# Patient Record
Sex: Female | Born: 1950
Health system: Southern US, Community
[De-identification: ages and names within clinical notes are randomized; demographics above are authoritative.]

## PROBLEM LIST (undated history)

## (undated) DIAGNOSIS — M8580 Other specified disorders of bone density and structure, unspecified site: Secondary | ICD-10-CM

## (undated) DIAGNOSIS — E785 Hyperlipidemia, unspecified: Secondary | ICD-10-CM

## (undated) HISTORY — PX: DENTAL SURGERY: SHX609

## (undated) HISTORY — DX: Other specified disorders of bone density and structure, unspecified site: M85.80

## (undated) HISTORY — DX: Hyperlipidemia, unspecified: E78.5

---

## 1999-08-03 ENCOUNTER — Encounter: Payer: Self-pay | Admitting: Obstetrics and Gynecology

## 1999-08-03 ENCOUNTER — Encounter: Admission: RE | Admit: 1999-08-03 | Discharge: 1999-08-03 | Payer: Self-pay | Admitting: Obstetrics and Gynecology

## 2001-02-27 ENCOUNTER — Encounter: Payer: Self-pay | Admitting: Obstetrics and Gynecology

## 2001-02-27 ENCOUNTER — Encounter: Admission: RE | Admit: 2001-02-27 | Discharge: 2001-02-27 | Payer: Self-pay | Admitting: Obstetrics and Gynecology

## 2002-05-29 ENCOUNTER — Encounter: Payer: Self-pay | Admitting: Obstetrics and Gynecology

## 2002-05-29 ENCOUNTER — Encounter: Admission: RE | Admit: 2002-05-29 | Discharge: 2002-05-29 | Payer: Self-pay | Admitting: Obstetrics and Gynecology

## 2005-01-14 ENCOUNTER — Encounter: Admission: RE | Admit: 2005-01-14 | Discharge: 2005-01-14 | Payer: Self-pay | Admitting: Family Medicine

## 2005-01-14 ENCOUNTER — Encounter: Admission: RE | Admit: 2005-01-14 | Discharge: 2005-01-14 | Payer: Self-pay | Admitting: Obstetrics and Gynecology

## 2006-01-20 ENCOUNTER — Encounter: Admission: RE | Admit: 2006-01-20 | Discharge: 2006-01-20 | Payer: Self-pay | Admitting: Obstetrics and Gynecology

## 2007-05-01 ENCOUNTER — Encounter: Admission: RE | Admit: 2007-05-01 | Discharge: 2007-05-01 | Payer: Self-pay | Admitting: Obstetrics and Gynecology

## 2008-05-05 ENCOUNTER — Encounter: Admission: RE | Admit: 2008-05-05 | Discharge: 2008-05-05 | Payer: Self-pay | Admitting: Obstetrics and Gynecology

## 2009-07-04 HISTORY — PX: COMBINED HYSTEROSCOPY DIAGNOSTIC / D&C: SUR297

## 2009-07-16 ENCOUNTER — Ambulatory Visit: Payer: Self-pay | Admitting: Gynecology

## 2009-07-20 ENCOUNTER — Ambulatory Visit: Payer: Self-pay | Admitting: Gynecology

## 2009-07-29 ENCOUNTER — Ambulatory Visit: Payer: Self-pay | Admitting: Gynecology

## 2009-08-05 ENCOUNTER — Ambulatory Visit: Payer: Self-pay | Admitting: Gynecology

## 2009-08-07 ENCOUNTER — Ambulatory Visit: Payer: Self-pay | Admitting: Gynecology

## 2009-08-07 ENCOUNTER — Ambulatory Visit (HOSPITAL_BASED_OUTPATIENT_CLINIC_OR_DEPARTMENT_OTHER): Admission: RE | Admit: 2009-08-07 | Discharge: 2009-08-07 | Payer: Self-pay | Admitting: Gynecology

## 2009-08-21 ENCOUNTER — Ambulatory Visit: Payer: Self-pay | Admitting: Gynecology

## 2009-11-23 ENCOUNTER — Encounter: Admission: RE | Admit: 2009-11-23 | Discharge: 2009-11-23 | Payer: Self-pay | Admitting: Gynecology

## 2010-12-15 ENCOUNTER — Other Ambulatory Visit: Payer: Self-pay | Admitting: Gynecology

## 2010-12-15 DIAGNOSIS — Z1231 Encounter for screening mammogram for malignant neoplasm of breast: Secondary | ICD-10-CM

## 2011-01-04 ENCOUNTER — Ambulatory Visit: Payer: Self-pay

## 2011-01-06 ENCOUNTER — Other Ambulatory Visit (HOSPITAL_COMMUNITY)
Admission: RE | Admit: 2011-01-06 | Discharge: 2011-01-06 | Disposition: A | Discharge status: Home / Self Care | Payer: BC Managed Care – PPO | Source: Ambulatory Visit | Attending: Gynecology | Admitting: Gynecology

## 2011-01-06 ENCOUNTER — Other Ambulatory Visit: Payer: Self-pay | Admitting: Gynecology

## 2011-01-06 ENCOUNTER — Encounter (INDEPENDENT_AMBULATORY_CARE_PROVIDER_SITE_OTHER): Payer: BC Managed Care – PPO | Admitting: Gynecology

## 2011-01-06 DIAGNOSIS — Z01419 Encounter for gynecological examination (general) (routine) without abnormal findings: Secondary | ICD-10-CM

## 2011-01-06 DIAGNOSIS — Z124 Encounter for screening for malignant neoplasm of cervix: Secondary | ICD-10-CM | POA: Insufficient documentation

## 2011-01-07 ENCOUNTER — Encounter: Payer: Self-pay | Admitting: *Deleted

## 2011-01-10 ENCOUNTER — Ambulatory Visit
Admission: RE | Admit: 2011-01-10 | Discharge: 2011-01-10 | Disposition: A | Discharge status: Home / Self Care | Payer: BC Managed Care – PPO | Source: Ambulatory Visit | Attending: Gynecology | Admitting: Gynecology

## 2011-01-10 DIAGNOSIS — Z1231 Encounter for screening mammogram for malignant neoplasm of breast: Secondary | ICD-10-CM

## 2011-02-02 ENCOUNTER — Encounter: Payer: Self-pay | Admitting: Gynecology

## 2012-01-09 ENCOUNTER — Other Ambulatory Visit: Payer: Self-pay | Admitting: Gynecology

## 2012-01-09 DIAGNOSIS — Z1231 Encounter for screening mammogram for malignant neoplasm of breast: Secondary | ICD-10-CM

## 2012-01-23 ENCOUNTER — Ambulatory Visit
Admission: RE | Admit: 2012-01-23 | Discharge: 2012-01-23 | Disposition: A | Discharge status: Home / Self Care | Payer: BC Managed Care – PPO | Source: Ambulatory Visit | Attending: Gynecology | Admitting: Gynecology

## 2012-01-23 DIAGNOSIS — Z1231 Encounter for screening mammogram for malignant neoplasm of breast: Secondary | ICD-10-CM

## 2012-10-11 ENCOUNTER — Ambulatory Visit (INDEPENDENT_AMBULATORY_CARE_PROVIDER_SITE_OTHER): Payer: BC Managed Care – PPO | Admitting: Family Medicine

## 2012-10-11 VITALS — BP 147/78 | HR 75 | Temp 97.9°F | Ht 64.0 in | Wt 206.8 lb

## 2012-10-11 DIAGNOSIS — J069 Acute upper respiratory infection, unspecified: Secondary | ICD-10-CM

## 2012-10-11 MED ORDER — AMOXICILLIN 500 MG PO CAPS: 500.0000 mg | ORAL_CAPSULE | Freq: Three times a day (TID) | ORAL | Status: DC

## 2012-10-11 NOTE — Progress Notes (Addendum)
  Subjective:    Patient ID: Rachael Nguyen, female    DOB: 12-16-1950, 62 y.o.   MRN: [id number]  HPI [name] presents today with cough chest congestion and sore throat   Review of Systems  Constitutional: Negative for fever and chills.  HENT: Positive for congestion, sore throat (scratchy) and sneezing. Negative for ear pain, rhinorrhea, trouble swallowing, neck pain, voice change, postnasal drip and sinus pressure.   Respiratory: Positive for cough (painful, slight phlegm).   Cardiovascular: Negative for chest pain.  Gastrointestinal: Negative.        Objective:   Physical Exam  Constitutional: She appears well-developed and well-nourished. No distress.  HENT:  Head: Normocephalic and atraumatic.  Right Ear: External ear normal.  Left Ear: External ear normal.  Eyes: Conjunctivae are normal. Right eye exhibits no discharge. Left eye exhibits no discharge.  Neck: Normal range of motion. Neck supple. No thyromegaly present.  Cardiovascular: Normal rate, regular rhythm, normal heart sounds and intact distal pulses.  Exam reveals no gallop.   No murmur heard. Pulmonary/Chest: Effort normal and breath sounds normal. No respiratory distress. She has no wheezes. She has no rales.  Dry cough  Lymphadenopathy:    She has cervical adenopathy (left).          Assessment & Plan:  1. URI (upper respiratory infection) - amoxicillin (AMOXIL) 500 MG capsule; Take 1 capsule (500 mg total) by mouth 3 (three) times daily.  Dispense: 30 capsule; Refill: 0  2. Use Mucinex one twice daily     Use Nasacort AQ one spray each nostril once daily  3. See. patient instruction note

## 2012-10-11 NOTE — Patient Instructions (Addendum)
Nasacort AQ over-the-counter 1-2 sprays each nostril once daily for allergic rhinitis Mucinex maximum strength one twice daily with a large glass of water Drink plenty of fluids Tylenol or ibuprofen as needed for aches and pains

## 2012-10-15 ENCOUNTER — Encounter: Payer: Self-pay | Admitting: Family Medicine

## 2012-10-17 ENCOUNTER — Telehealth: Payer: Self-pay | Admitting: Family Medicine

## 2012-10-18 ENCOUNTER — Ambulatory Visit (INDEPENDENT_AMBULATORY_CARE_PROVIDER_SITE_OTHER): Payer: BC Managed Care – PPO | Admitting: Nurse Practitioner

## 2012-10-18 VITALS — BP 170/79 | HR 72 | Temp 98.6°F | Ht 64.0 in | Wt 207.0 lb

## 2012-10-18 DIAGNOSIS — J209 Acute bronchitis, unspecified: Secondary | ICD-10-CM

## 2012-10-18 MED ORDER — METHYLPREDNISOLONE ACETATE 80 MG/ML IJ SUSP
80.0000 mg | Freq: Once | INTRAMUSCULAR | Status: AC
  Administered 2012-10-18: 80 mg via INTRAMUSCULAR

## 2012-10-18 MED ORDER — HYDROCODONE-HOMATROPINE 5-1.5 MG/5ML PO SYRP: 5.0000 mL | ORAL_SOLUTION | Freq: Three times a day (TID) | ORAL | Status: DC | PRN

## 2012-10-18 NOTE — Telephone Encounter (Signed)
Patient is coming in today for possible cortisone shot as she is not improve with antibiotics

## 2012-10-18 NOTE — Progress Notes (Signed)
  Subjective:    Patient ID: Rachael Nguyen, female    DOB: 1951/03/12, 62 y.o.   MRN: [id number]  HPIPatient in still C/O cough. Was seen by Dr. Christell Constant 1 week ago. Was given amoxicillin and told to do mucinex OTC. Cough no better. Getting hoarse.    Review of Systems  Constitutional: Negative for fever and chills.  HENT: Negative for ear pain, congestion, rhinorrhea, sneezing and sinus pressure.   Respiratory: Positive for cough.   Cardiovascular: Negative.   Gastrointestinal: Negative.   Psychiatric/Behavioral: Negative.        Objective:   Physical Exam  Constitutional: She appears well-developed and well-nourished.  HENT:  Head: Normocephalic.  Right Ear: External ear normal.  Left Ear: External ear normal.  Nose: Nose normal.  Mouth/Throat: Oropharynx is clear and moist.  Eyes: Pupils are equal, round, and reactive to light.  Neck: Normal range of motion. Neck supple.  Cardiovascular: Normal rate, regular rhythm and normal heart sounds.   Pulmonary/Chest: Effort normal. She has no wheezes. She has no rales.  Dry cough  Lymphadenopathy:    She has no cervical adenopathy.  Skin: Skin is warm and dry.  Psychiatric: She has a normal mood and affect. Her behavior is normal. Judgment and thought content normal.   BP 170/79  Pulse 72  Temp(Src) 98.6 F (37 C) (Oral)  Ht 5\' 4"  (1.626 m)  Wt 207 lb (93.895 kg)  BMI 35.51 kg/m2        Assessment & Plan:  1. Acute bronchitis 1. Take meds as prescribed 2. Use a cool mist humidifier especially during the winter months and when heat has  been humid. 3. Use saline nose sprays frequently 4. Saline irrigations of the nose can be very helpful if done frequently.  * 4X daily for 1 week*  * Use of a nettie pot can be helpful with this. Follow directions with this* 5. Drink plenty of fluids 6. Keep thermostat turn down low 7.For any cough or congestion  Use plain Mucinex- regular strength or max strength is fine   *  Children- consult with Pharmacist for dosing 8. For fever or aces or pains- take tylenol or ibuprofen appropriate for age and weight.  * for fevers greater than 101 orally you may alternate ibuprofen and tylenol every  3 hours.    - methylPREDNISolone acetate (DEPO-MEDROL) injection 80 mg; Inject 1 mL (80 mg total) into the muscle once. - HYDROcodone-homatropine (HYCODAN) 5-1.5 MG/5ML syrup; Take 5 mLs by mouth every 8 (eight) hours as needed for cough.  Dispense: 120 mL; Refill: 0  Mary-Margaret Daphine Deutscher, FNP

## 2012-10-18 NOTE — Telephone Encounter (Signed)
Patient seen in office

## 2012-10-18 NOTE — Patient Instructions (Signed)

## 2012-10-18 NOTE — Telephone Encounter (Signed)
Patient feels better over all but continues to have a nonproductive cough.  Has one dose of Amoxicillin left today and is taking Mucinex.  She thinks a steroid injection would help her symptoms.

## 2012-10-19 ENCOUNTER — Encounter: Payer: Self-pay | Admitting: Nurse Practitioner

## 2013-02-12 ENCOUNTER — Ambulatory Visit (INDEPENDENT_AMBULATORY_CARE_PROVIDER_SITE_OTHER): Payer: BC Managed Care – PPO | Admitting: Family Medicine

## 2013-02-12 ENCOUNTER — Encounter: Payer: Self-pay | Admitting: Family Medicine

## 2013-02-12 ENCOUNTER — Other Ambulatory Visit: Payer: Self-pay

## 2013-02-12 VITALS — BP 178/82 | HR 63 | Temp 97.4°F | Wt 211.2 lb

## 2013-02-12 DIAGNOSIS — Z0000 Encounter for general adult medical examination without abnormal findings: Secondary | ICD-10-CM

## 2013-02-12 DIAGNOSIS — Z1231 Encounter for screening mammogram for malignant neoplasm of breast: Secondary | ICD-10-CM

## 2013-02-12 DIAGNOSIS — E785 Hyperlipidemia, unspecified: Secondary | ICD-10-CM

## 2013-02-12 MED ORDER — PRAVASTATIN SODIUM 40 MG PO TABS: 40.0000 mg | ORAL_TABLET | Freq: Every day | ORAL | Status: DC

## 2013-02-12 NOTE — Patient Instructions (Signed)

## 2013-02-12 NOTE — Progress Notes (Signed)
  Subjective:    Patient ID: Rachael Nguyen, female    DOB: 1951/05/31, 62 y.o.   MRN: [id number]  HPI This 62 y.o. female presents for evaluation of follow up appointment.  She has hx of hyperlipidemia And she needs refills on her pravastatin.  She has been having some weight problems and she  Has been under a lot of stress.  She states she would like some help with losing weight.   Review of Systems No chest pain, SOB, HA, dizziness, vision change, N/V, diarrhea, constipation, dysuria, urinary urgency or frequency, myalgias, arthralgias or rash.     Objective:   Physical Exam Vital signs noted  Well developed well nourished female.  HEENT - Head atraumatic Normocephalic                Eyes - PERRLA, Conjuctiva - clear Sclera- Clear EOMI                Ears - EAC's Wnl TM's Wnl Gross Hearing WNL                Nose - Nares patent                 Throat - oropharanx wnl Respiratory - Lungs CTA bilateral Cardiac - RRR S1 and S2 without murmur GI - Abdomen soft Nontender and bowel sounds active x 4 Extremities - No edema. Neuro - Grossly intact.       Assessment & Plan:  Other and unspecified hyperlipidemia - Plan: pravastatin (PRAVACHOL) 40 MG tablet  Routine general medical examination at a health care facility - Plan: POCT CBC, Lipid panel, CMP14+EGFR, TSH  Obesity - Discussed with patient she could follow up with Tammy the clinical pharmacist for weight loss counseling. Discussed with patient she can go to weight loss center and she declines stating she does not have time.

## 2013-02-13 ENCOUNTER — Other Ambulatory Visit (INDEPENDENT_AMBULATORY_CARE_PROVIDER_SITE_OTHER): Payer: BC Managed Care – PPO

## 2013-02-13 DIAGNOSIS — Z0000 Encounter for general adult medical examination without abnormal findings: Secondary | ICD-10-CM

## 2013-02-13 LAB — POCT CBC
Granulocyte percent: 56.9 %G (ref 37–80)
HCT, POC: 40 % (ref 37.7–47.9)
Hemoglobin: 13.3 g/dL (ref 12.2–16.2)
Lymph, poc: 1.9 (ref 0.6–3.4)
MCH, POC: 28.8 pg (ref 27–31.2)
MCHC: 33.3 g/dL (ref 31.8–35.4)
MCV: 86.5 fL (ref 80–97)
MPV: 7.5 fL (ref 0–99.8)
POC Granulocyte: 3 (ref 2–6.9)
POC LYMPH PERCENT: 37.3 %L (ref 10–50)
Platelet Count, POC: 267 10*3/uL (ref 142–424)
RBC: 4.6 M/uL (ref 4.04–5.48)
RDW, POC: 14 %
WBC: 5.2 10*3/uL (ref 4.6–10.2)

## 2013-02-13 NOTE — Progress Notes (Signed)
Pt came in for labs only 

## 2013-02-14 LAB — CMP14+EGFR
ALT: 14 IU/L (ref 0–32)
AST: 18 IU/L (ref 0–40)
Albumin/Globulin Ratio: 1.5 (ref 1.1–2.5)
Albumin: 4 g/dL (ref 3.5–5.5)
Alkaline Phosphatase: 66 IU/L (ref 39–117)
BUN/Creatinine Ratio: 21 (ref 9–23)
BUN: 15 mg/dL (ref 6–24)
CO2: 25 mmol/L (ref 18–29)
Calcium: 9.7 mg/dL (ref 8.7–10.2)
Chloride: 103 mmol/L (ref 97–108)
Creatinine, Ser: 0.72 mg/dL (ref 0.57–1.00)
GFR calc Af Amer: 106 mL/min/{1.73_m2} (ref >59)
GFR calc non Af Amer: 92 mL/min/{1.73_m2} (ref >59)
Globulin, Total: 2.6 g/dL (ref 1.5–4.5)
Glucose: 87 mg/dL (ref 65–99)
Potassium: 4.1 mmol/L (ref 3.5–5.2)
Sodium: 142 mmol/L (ref 134–144)
Total Bilirubin: 0.7 mg/dL (ref 0.0–1.2)
Total Protein: 6.6 g/dL (ref 6.0–8.5)

## 2013-02-14 LAB — LIPID PANEL
Chol/HDL Ratio: 2.3 ratio units (ref 0.0–4.4)
Cholesterol, Total: 164 mg/dL (ref 100–199)
HDL: 70 mg/dL (ref >39)
LDL Calculated: 75 mg/dL (ref 0–99)
Triglycerides: 95 mg/dL (ref 0–149)
VLDL Cholesterol Cal: 19 mg/dL (ref 5–40)

## 2013-02-14 LAB — TSH: TSH: 2.82 u[IU]/mL (ref 0.450–4.500)

## 2013-02-22 ENCOUNTER — Encounter: Payer: Self-pay | Admitting: *Deleted

## 2013-03-11 ENCOUNTER — Ambulatory Visit: Payer: BC Managed Care – PPO

## 2013-03-28 ENCOUNTER — Ambulatory Visit (INDEPENDENT_AMBULATORY_CARE_PROVIDER_SITE_OTHER): Payer: BC Managed Care – PPO | Admitting: Nurse Practitioner

## 2013-03-28 VITALS — BP 132/88 | HR 63 | Temp 97.5°F | Ht 64.0 in | Wt 202.0 lb

## 2013-03-28 DIAGNOSIS — J209 Acute bronchitis, unspecified: Secondary | ICD-10-CM

## 2013-03-28 DIAGNOSIS — E785 Hyperlipidemia, unspecified: Secondary | ICD-10-CM | POA: Insufficient documentation

## 2013-03-28 MED ORDER — METHYLPREDNISOLONE ACETATE 80 MG/ML IJ SUSP
80.0000 mg | Freq: Once | INTRAMUSCULAR | Status: AC
  Administered 2013-03-28: 80 mg via INTRAMUSCULAR

## 2013-03-28 MED ORDER — AZITHROMYCIN 250 MG PO TABS: ORAL_TABLET | ORAL | Status: DC

## 2013-03-28 NOTE — Progress Notes (Signed)
  Subjective:    Patient ID: Rachael Nguyen, female    DOB: 1950-07-08, 62 y.o.   MRN: [id number]  HPI Patient in c/o of congestion that started over a week ago and has now gotten into her chest- She is coughing a lot an dis having trouble sleeping.    Review of Systems  Constitutional: Negative for fever, chills and appetite change.  HENT: Positive for congestion and rhinorrhea. Negative for ear pain.   Respiratory: Positive for cough (productive).   Cardiovascular: Negative.   Gastrointestinal: Negative.   Genitourinary: Negative.   Musculoskeletal: Negative.        Objective:   Physical Exam  Constitutional: She is oriented to person, place, and time. She appears well-developed and well-nourished.  HENT:  Right Ear: Hearing, tympanic membrane, external ear and ear canal normal.  Left Ear: Hearing, tympanic membrane, external ear and ear canal normal.  Nose: Mucosal edema and rhinorrhea present. Right sinus exhibits no maxillary sinus tenderness and no frontal sinus tenderness. Left sinus exhibits no maxillary sinus tenderness and no frontal sinus tenderness.  Mouth/Throat: Uvula is midline, oropharynx is clear and moist and mucous membranes are normal.  Cardiovascular: Normal rate and normal heart sounds.   Pulmonary/Chest: Effort normal and breath sounds normal. No respiratory distress. She has no wheezes. She has no rales.  Deep dry cough  Neurological: She is alert and oriented to person, place, and time.  Skin: Skin is warm.  Psychiatric: She has a normal mood and affect. Her behavior is normal. Judgment and thought content normal.    BP 155/83  Pulse 63  Temp(Src) 97.5 F (36.4 C) (Oral)  Ht 5\' 4"  (1.626 m)  Wt 202 lb (91.627 kg)  BMI 34.66 kg/m2       Assessment & Plan:   1. Acute bronchitis    1. Take meds as prescribed 2. Use a cool mist humidifier especially during the winter months and when heat has  been humid. 3. Use saline nose sprays frequently 4.  Saline irrigations of the nose can be very helpful if done frequently.  * 4X daily for 1 week*  * Use of a nettie pot can be helpful with this. Follow directions with this* 5. Drink plenty of fluids 6. Keep thermostat turn down low 7.For any cough or congestion  Use plain Mucinex- regular strength or max strength is fine   * Children- consult with Pharmacist for dosing 8. For fever or aces or pains- take tylenol or ibuprofen appropriate for age and weight.  * for fevers greater than 101 orally you may alternate ibuprofen and tylenol every  3 hours.   Meds ordered this encounter  Medications  . azithromycin (ZITHROMAX) 250 MG tablet    Sig: As directed    Dispense:  6 each    Refill:  0    Order Specific Question:  Supervising Provider    Answer:  Ernestina Penna [[id number]]  . methylPREDNISolone acetate (DEPO-MEDROL) injection 80 mg    Sig:    Mary-Margaret Daphine Deutscher, FNP

## 2013-03-28 NOTE — Patient Instructions (Signed)

## 2013-04-01 ENCOUNTER — Ambulatory Visit
Admission: RE | Admit: 2013-04-01 | Discharge: 2013-04-01 | Disposition: A | Discharge status: Home / Self Care | Payer: BC Managed Care – PPO | Source: Ambulatory Visit

## 2013-04-01 DIAGNOSIS — Z1231 Encounter for screening mammogram for malignant neoplasm of breast: Secondary | ICD-10-CM

## 2013-11-11 ENCOUNTER — Ambulatory Visit (INDEPENDENT_AMBULATORY_CARE_PROVIDER_SITE_OTHER): Payer: BC Managed Care – PPO | Admitting: Family Medicine

## 2013-11-11 ENCOUNTER — Encounter: Payer: Self-pay | Admitting: Family Medicine

## 2013-11-11 VITALS — BP 142/79 | HR 77 | Temp 97.1°F | Ht 64.0 in | Wt 215.6 lb

## 2013-11-11 DIAGNOSIS — J069 Acute upper respiratory infection, unspecified: Secondary | ICD-10-CM

## 2013-11-11 MED ORDER — AZITHROMYCIN 250 MG PO TABS: ORAL_TABLET | ORAL | Status: DC

## 2013-11-11 MED ORDER — METHYLPREDNISOLONE ACETATE 80 MG/ML IJ SUSP
80.0000 mg | Freq: Once | INTRAMUSCULAR | Status: AC
  Administered 2013-11-11: 80 mg via INTRAMUSCULAR

## 2013-11-11 NOTE — Progress Notes (Signed)
   Subjective:    Patient ID: Rachael Nguyen, female    DOB: [birth year], 63 y.o.   MRN: [id number]  HPI This 63 y.o. female presents for evaluation of cough and URI sx's for over a week.   Review of Systems No chest pain, SOB, HA, dizziness, vision change, N/V, diarrhea, constipation, dysuria, urinary urgency or frequency, myalgias, arthralgias or rash.     Objective:   Physical Exam  Vital signs noted  Well developed well nourished female.  HEENT - Head atraumatic Normocephalic                Eyes - PERRLA, Conjuctiva - clear Sclera- Clear EOMI                Ears - EAC's Wnl TM's Wnl Gross Hearing WNL                 Throat - oropharanx wnl Respiratory - Lungs CTA bilateral Cardiac - RRR S1 and S2 without murmur       Assessment & Plan:  URI (upper respiratory infection) - Plan: methylPREDNISolone acetate (DEPO-MEDROL) injection 80 mg, azithromycin (ZITHROMAX) 250 MG tablet  Push po fluids, rest, tylenol and motrin otc prn as directed for fever, arthralgias, and myalgias.  Follow up prn if sx's continue or persist.  Lysbeth Penner FNP

## 2014-02-18 ENCOUNTER — Telehealth: Payer: Self-pay | Admitting: Family Medicine

## 2014-02-18 ENCOUNTER — Other Ambulatory Visit: Payer: Self-pay | Admitting: Family Medicine

## 2014-02-19 ENCOUNTER — Other Ambulatory Visit: Payer: Self-pay | Admitting: Nurse Practitioner

## 2014-02-19 DIAGNOSIS — E785 Hyperlipidemia, unspecified: Secondary | ICD-10-CM

## 2014-02-19 MED ORDER — PRAVASTATIN SODIUM 40 MG PO TABS: 40.0000 mg | ORAL_TABLET | Freq: Every day | ORAL | Status: DC

## 2014-02-19 NOTE — Telephone Encounter (Signed)
Labs on your desk. 

## 2014-02-19 NOTE — Telephone Encounter (Signed)
i need results they do  Not put that in computer.

## 2014-02-19 NOTE — Telephone Encounter (Signed)
It was filled for a year!

## 2014-02-19 NOTE — Telephone Encounter (Signed)
No labs since 2014- needs to be seen for labs before can get rx on pravaststin

## 2014-02-19 NOTE — Telephone Encounter (Signed)
Patient had lab work done through [place] at the [facility]?

## 2014-02-19 NOTE — Telephone Encounter (Signed)
Patient aware already has an appointment

## 2014-02-19 NOTE — Telephone Encounter (Signed)
Will fil for one month- but has not been seen by provider since 03/2013- .Has to see provider can't just have labs done.

## 2014-03-25 ENCOUNTER — Ambulatory Visit (INDEPENDENT_AMBULATORY_CARE_PROVIDER_SITE_OTHER): Payer: BC Managed Care – PPO | Admitting: Family Medicine

## 2014-03-25 ENCOUNTER — Encounter: Payer: Self-pay | Admitting: Family Medicine

## 2014-03-25 VITALS — BP 138/81 | HR 78 | Temp 97.4°F | Ht 64.0 in | Wt 210.0 lb

## 2014-03-25 DIAGNOSIS — J069 Acute upper respiratory infection, unspecified: Secondary | ICD-10-CM

## 2014-03-25 DIAGNOSIS — E785 Hyperlipidemia, unspecified: Secondary | ICD-10-CM

## 2014-03-25 MED ORDER — METHYLPREDNISOLONE ACETATE 80 MG/ML IJ SUSP
80.0000 mg | Freq: Once | INTRAMUSCULAR | Status: AC
  Administered 2014-03-25: 80 mg via INTRAMUSCULAR

## 2014-03-25 MED ORDER — PRAVASTATIN SODIUM 40 MG PO TABS: 40.0000 mg | ORAL_TABLET | Freq: Every day | ORAL | Status: DC

## 2014-03-25 NOTE — Progress Notes (Signed)
   Subjective:    Patient ID: Rachael Nguyen, female    DOB: 03/22/1951, 63 y.o.   MRN: [id number]  HPI This 63 y.o. female presents for evaluation of uri sx's and refill on her cholesterol medicine.   Review of Systems    No chest pain, SOB, HA, dizziness, vision change, N/V, diarrhea, constipation, dysuria, urinary urgency or frequency, myalgias, arthralgias or rash.  Objective:   Physical Exam Vital signs noted  Well developed well nourished female.  HEENT - Head atraumatic Normocephalic                Eyes - PERRLA, Conjuctiva - clear Sclera- Clear EOMI                Ears - EAC's Wnl TM's Wnl Gross Hearing WNL                Nose - Nares patent                 Throat - oropharanx wnl Respiratory - Lungs CTA bilateral Cardiac - RRR S1 and S2 without murmur GI - Abdomen soft Nontender and bowel sounds active x 4 Extremities - No edema. Neuro - Grossly intact.        Assessment & Plan:  Other and unspecified hyperlipidemia - Plan: pravastatin (PRAVACHOL) 40 MG tablet  URI (upper respiratory infection) - Plan: methylPREDNISolone acetate (DEPO-MEDROL) injection 80 mg  Push po fluids, rest, tylenol and motrin otc prn as directed for fever, arthralgias, and myalgias.  Follow up prn if sx's continue or persist.  Lysbeth Penner FNP

## 2014-04-08 ENCOUNTER — Other Ambulatory Visit: Payer: Self-pay

## 2014-04-08 DIAGNOSIS — Z1239 Encounter for other screening for malignant neoplasm of breast: Secondary | ICD-10-CM

## 2014-04-21 ENCOUNTER — Other Ambulatory Visit: Payer: Self-pay

## 2014-04-21 ENCOUNTER — Ambulatory Visit
Admission: RE | Admit: 2014-04-21 | Discharge: 2014-04-21 | Disposition: A | Discharge status: Home / Self Care | Payer: BC Managed Care – PPO | Source: Ambulatory Visit

## 2014-04-21 DIAGNOSIS — Z1239 Encounter for other screening for malignant neoplasm of breast: Secondary | ICD-10-CM

## 2014-04-21 DIAGNOSIS — Z1231 Encounter for screening mammogram for malignant neoplasm of breast: Secondary | ICD-10-CM

## 2014-05-21 ENCOUNTER — Encounter: Payer: Self-pay | Admitting: Family Medicine

## 2014-05-21 ENCOUNTER — Ambulatory Visit (INDEPENDENT_AMBULATORY_CARE_PROVIDER_SITE_OTHER): Payer: BC Managed Care – PPO | Admitting: Family Medicine

## 2014-05-21 VITALS — BP 153/79 | HR 72 | Temp 97.8°F | Ht 64.0 in | Wt 214.0 lb

## 2014-05-21 DIAGNOSIS — J069 Acute upper respiratory infection, unspecified: Secondary | ICD-10-CM

## 2014-05-21 MED ORDER — AZITHROMYCIN 250 MG PO TABS: ORAL_TABLET | ORAL | Status: DC

## 2014-05-21 MED ORDER — METHYLPREDNISOLONE ACETATE 80 MG/ML IJ SUSP
80.0000 mg | Freq: Once | INTRAMUSCULAR | Status: AC
  Administered 2014-05-21: 80 mg via INTRAMUSCULAR

## 2014-05-21 NOTE — Progress Notes (Signed)
   Subjective:    Patient ID: Rachael Nguyen, female    DOB: [birth year], 63 y.o.   MRN: [id number]  HPI Patient is here with c/o uri sx's and congestion.  Review of Systems  Constitutional: Negative for fever.  HENT: Negative for ear pain.   Eyes: Negative for discharge.  Respiratory: Negative for cough.   Cardiovascular: Negative for chest pain.  Gastrointestinal: Negative for abdominal distention.  Endocrine: Negative for polyuria.  Genitourinary: Negative for difficulty urinating.  Musculoskeletal: Negative for gait problem and neck pain.  Skin: Negative for color change and rash.  Neurological: Negative for speech difficulty and headaches.  Psychiatric/Behavioral: Negative for agitation.       Objective:    BP 153/79 mmHg  Pulse 72  Temp(Src) 97.8 F (36.6 C) (Oral)  Ht 5\' 4"  (1.626 m)  Wt 214 lb (97.07 kg)  BMI 36.72 kg/m2 Physical Exam  Constitutional: She is oriented to person, place, and time. She appears well-developed and well-nourished.  HENT:  Head: Normocephalic and atraumatic.  Mouth/Throat: Oropharynx is clear and moist.  Eyes: Pupils are equal, round, and reactive to light.  Neck: Normal range of motion. Neck supple.  Cardiovascular: Normal rate and regular rhythm.   No murmur heard. Pulmonary/Chest: Effort normal and breath sounds normal.  Abdominal: Soft. Bowel sounds are normal. There is no tenderness.  Neurological: She is alert and oriented to person, place, and time.  Skin: Skin is warm and dry.  Psychiatric: She has a normal mood and affect.          Assessment & Plan:     ICD-9-CM ICD-10-CM   1. URI (upper respiratory infection) 465.9 J06.9 azithromycin (ZITHROMAX) 250 MG tablet     methylPREDNISolone acetate (DEPO-MEDROL) injection 80 mg   Push po fluids, rest, tylenol and motrin otc prn as directed for fever, arthralgias, and myalgias.  Follow up prn if sx's continue or persist.  No Follow-up on file.  Lysbeth Penner FNP

## 2014-05-22 ENCOUNTER — Telehealth: Payer: Self-pay | Admitting: Family Medicine

## 2014-05-22 NOTE — Telephone Encounter (Signed)
Patient aware and was also told if it did not improve to give [place] a call back that we had someone on call all weekend and patient verbalized understanding.

## 2014-05-22 NOTE — Telephone Encounter (Signed)
Patient states that her BP this am was 160/100. It was elevated here in the office last night. Patient states she had been taking alka selzer plus cough and cold OTC and then she also received a steroid shot last night. Could this be causing her BP to be elevated if not what do you recommend.

## 2014-05-22 NOTE — Telephone Encounter (Signed)
Avoid caffeine and decongestants rto if does not improve

## 2015-01-23 ENCOUNTER — Encounter: Payer: Self-pay | Admitting: Gastroenterology

## 2015-01-23 ENCOUNTER — Encounter: Payer: Self-pay | Admitting: Family Medicine

## 2015-01-23 ENCOUNTER — Ambulatory Visit (INDEPENDENT_AMBULATORY_CARE_PROVIDER_SITE_OTHER): Payer: BC Managed Care – PPO | Admitting: Family Medicine

## 2015-01-23 VITALS — BP 148/92 | HR 75 | Temp 97.0°F | Ht 64.0 in | Wt 193.4 lb

## 2015-01-23 DIAGNOSIS — R5383 Other fatigue: Secondary | ICD-10-CM | POA: Diagnosis not present

## 2015-01-23 DIAGNOSIS — H6983 Other specified disorders of Eustachian tube, bilateral: Secondary | ICD-10-CM

## 2015-01-23 DIAGNOSIS — Z139 Encounter for screening, unspecified: Secondary | ICD-10-CM | POA: Diagnosis not present

## 2015-01-23 DIAGNOSIS — H6993 Unspecified Eustachian tube disorder, bilateral: Secondary | ICD-10-CM

## 2015-01-23 DIAGNOSIS — E785 Hyperlipidemia, unspecified: Secondary | ICD-10-CM

## 2015-01-23 LAB — POCT CBC
GRANULOCYTE PERCENT: 62.2 % (ref 37–80)
HEMATOCRIT: 40.8 % (ref 37.7–47.9)
Hemoglobin: 12.8 g/dL (ref 12.2–16.2)
Lymph, poc: 1.8 (ref 0.6–3.4)
MCH, POC: 26.7 pg — AB (ref 27–31.2)
MCHC: 31.5 g/dL — AB (ref 31.8–35.4)
MCV: 85 fL (ref 80–97)
MPV: 7.6 fL (ref 0–99.8)
PLATELET COUNT, POC: 277 10*3/uL (ref 142–424)
POC GRANULOCYTE: 3.7 (ref 2–6.9)
POC LYMPH PERCENT: 30.8 %L (ref 10–50)
RBC: 4.8 M/uL (ref 4.04–5.48)
RDW, POC: 14.8 %
WBC: 6 10*3/uL (ref 4.6–10.2)

## 2015-01-23 NOTE — Progress Notes (Signed)
Subjective:  Patient ID: Rachael Nguyen, female    DOB: 29-Mar-1951  Age: 64 y.o. MRN: [id number]  CC: Hyperlipidemia and Dizziness   HPI [name] presents for follow-up of elevated cholesterol. Doing well without complaints on current medication. Denies side effects of statin including myalgia and arthralgia and nausea. Also in today for liver function testing. Currently no chest pain, shortness of breath or other cardiovascular related symptoms noted. She also has periodic feeling of loss of equilibrium lasting just a few moments. Random in occurrence. It happens perhaps once or twice a month. The longest duration was 10 minutes. She has not passed out she has not fallen. It resolves spontaneously without treatment. History [name] has a past medical history of Osteopenia and Hyperlipidemia.   She has past surgical history that includes Combined hysteroscopy diagnostic / D&C (2011).   Her family history is not on file.She reports that she has never smoked. She does not have any smokeless tobacco history on file. She reports that she does not drink alcohol or use illicit drugs.  Current Outpatient Prescriptions on File Prior to Visit  Medication Sig Dispense Refill  . pravastatin (PRAVACHOL) 40 MG tablet Take 1 tablet (40 mg total) by mouth daily. 30 tablet 11   No current facility-administered medications on file prior to visit.    ROS Review of Systems  Constitutional: Positive for fatigue. Negative for fever, chills, diaphoresis, appetite change and unexpected weight change.  HENT: Negative for congestion, ear pain, hearing loss, postnasal drip, rhinorrhea, sneezing, sore throat and trouble swallowing.   Eyes: Negative for pain.  Respiratory: Negative for cough, chest tightness and shortness of breath.   Cardiovascular: Negative for chest pain and palpitations.  Gastrointestinal: Negative for nausea, vomiting, abdominal pain, diarrhea and constipation.  Genitourinary: Negative  for dysuria, frequency and menstrual problem.  Musculoskeletal: Negative for joint swelling and arthralgias.  Skin: Negative for rash.  Neurological: Positive for dizziness. Negative for weakness, light-headedness, numbness and headaches.  Psychiatric/Behavioral: Negative for dysphoric mood and agitation.    Objective:  BP 148/92 mmHg  Pulse 75  Temp(Src) 97 F (36.1 C) (Oral)  Ht _0  (1.626 m)  Wt 193 lb 6.4 oz (87.726 kg)  BMI 33.18 kg/m2  BP Readings from Last 3 Encounters:  01/23/15 148/92  05/21/14 153/79  03/25/14 138/81    Wt Readings from Last 3 Encounters:  01/23/15 193 lb 6.4 oz (87.726 kg)  05/21/14 214 lb (97.07 kg)  03/25/14 210 lb (95.255 kg)     Physical Exam  Constitutional: She is oriented to person, place, and time. She appears well-developed and well-nourished. No distress.  HENT:  Head: Normocephalic and atraumatic.  Right Ear: External ear normal.  Left Ear: External ear normal.  Nose: Mucosal edema present. No nasal deformity. Right sinus exhibits no maxillary sinus tenderness and no frontal sinus tenderness. Left sinus exhibits no maxillary sinus tenderness and no frontal sinus tenderness.  Mouth/Throat: Oropharynx is clear and moist. No oropharyngeal exudate.  Eyes: Conjunctivae and EOM are normal. Pupils are equal, round, and reactive to light.  Neck: Normal range of motion. Neck supple. No thyromegaly present.  Cardiovascular: Normal rate, regular rhythm and normal heart sounds.   No murmur heard. Pulmonary/Chest: Effort normal and breath sounds normal. No respiratory distress. She has no wheezes. She has no rales.  Abdominal: Soft. Bowel sounds are normal. She exhibits no distension. There is no tenderness.  Lymphadenopathy:    She has no cervical adenopathy.  Neurological: She  is alert and oriented to person, place, and time. She has normal reflexes.  Skin: Skin is warm and dry.  Psychiatric: She has a normal mood and affect. Her behavior  is normal. Judgment and thought content normal.    No results found for: HGBA1C  Lab Results  Component Value Date   WBC 6.0 01/23/2015   HGB 12.8 01/23/2015   HCT 40.8 01/23/2015   GLUCOSE 87 02/13/2013   CHOL 164 02/13/2013   TRIG 95 02/13/2013   HDL 70 02/13/2013   LDLCALC 75 02/13/2013   ALT 14 02/13/2013   AST 18 02/13/2013   NA 142 02/13/2013   K 4.1 02/13/2013   CL 103 02/13/2013   CREATININE 0.72 02/13/2013   BUN 15 02/13/2013   CO2 25 02/13/2013   TSH 2.820 02/13/2013    Mm Digital Screening Bilateral  04/22/2014   CLINICAL DATA:  Screening.  EXAM: DIGITAL SCREENING BILATERAL MAMMOGRAM WITH CAD  COMPARISON:  Previous exam(s)  ACR Breast Density Category a: The breast tissue is almost entirely fatty.  FINDINGS: There are no findings suspicious for malignancy. Images were processed with CAD.  IMPRESSION: No mammographic evidence of malignancy. A result letter of this screening mammogram will be mailed directly to the patient.  RECOMMENDATION: Screening mammogram in one year. (Code:SM-B-01Y)  BI-RADS CATEGORY  1: Negative.   Electronically Signed   By: Shon Hale M.D.   On: 04/22/2014 16:48    Assessment & Plan:   [name] was seen today for hyperlipidemia and dizziness.  Diagnoses and all orders for this visit:  Screening Orders: -     Ambulatory referral to Gastroenterology -     Thyroid Panel With TSH -     Vit D  25 hydroxy (rtn osteoporosis monitoring)  Hyperlipemia Orders: -     CMP14+EGFR -     Lipid panel  Other fatigue Orders: -     POCT CBC -     Thyroid Panel With TSH -     Vit D  25 hydroxy (rtn osteoporosis monitoring)   I have discontinued Ms. Mccalister azithromycin. I am also having her maintain her pravastatin.  No orders of the defined types were placed in this encounter.     Follow-up: No Follow-up on file.  Claretta Fraise, M.D.

## 2015-01-24 LAB — CMP14+EGFR
ALT: 12 IU/L (ref 0–32)
AST: 14 IU/L (ref 0–40)
Albumin/Globulin Ratio: 1.7 (ref 1.1–2.5)
Albumin: 4.3 g/dL (ref 3.6–4.8)
Alkaline Phosphatase: 79 IU/L (ref 39–117)
BUN/Creatinine Ratio: 19 (ref 11–26)
BUN: 12 mg/dL (ref 8–27)
Bilirubin Total: 0.8 mg/dL (ref 0.0–1.2)
CALCIUM: 9.8 mg/dL (ref 8.7–10.3)
CO2: 24 mmol/L (ref 18–29)
CREATININE: 0.64 mg/dL (ref 0.57–1.00)
Chloride: 102 mmol/L (ref 97–108)
GFR calc Af Amer: 111 mL/min/{1.73_m2} (ref >59)
GFR, EST NON AFRICAN AMERICAN: 97 mL/min/{1.73_m2} (ref >59)
GLOBULIN, TOTAL: 2.5 g/dL (ref 1.5–4.5)
GLUCOSE: 86 mg/dL (ref 65–99)
POTASSIUM: 4.3 mmol/L (ref 3.5–5.2)
Sodium: 140 mmol/L (ref 134–144)
Total Protein: 6.8 g/dL (ref 6.0–8.5)

## 2015-01-24 LAB — VITAMIN D 25 HYDROXY (VIT D DEFICIENCY, FRACTURES): Vit D, 25-Hydroxy: 32.7 ng/mL (ref 30.0–100.0)

## 2015-01-24 LAB — THYROID PANEL WITH TSH
FREE THYROXINE INDEX: 2.6 (ref 1.2–4.9)
T3 Uptake Ratio: 26 % (ref 24–39)
T4, Total: 9.9 ug/dL (ref 4.5–12.0)
TSH: 2.19 u[IU]/mL (ref 0.450–4.500)

## 2015-01-24 LAB — LIPID PANEL
CHOL/HDL RATIO: 2.5 ratio (ref 0.0–4.4)
CHOLESTEROL TOTAL: 170 mg/dL (ref 100–199)
HDL: 69 mg/dL (ref >39)
LDL Calculated: 86 mg/dL (ref 0–99)
Triglycerides: 73 mg/dL (ref 0–149)
VLDL Cholesterol Cal: 15 mg/dL (ref 5–40)

## 2015-01-26 ENCOUNTER — Encounter: Payer: Self-pay | Admitting: *Deleted

## 2015-01-26 ENCOUNTER — Telehealth: Payer: Self-pay | Admitting: *Deleted

## 2015-01-26 NOTE — Telephone Encounter (Signed)
-----   Message from Claretta Fraise, MD sent at 01/24/2015  5:04 PM EDT ----- Rachael Nguyen,    Your lab result is normal.Some minor variations that are not significant are commonly marked abnormal, but do not represent any medical problem for you.  Best regards, Claretta Fraise, M.D.

## 2015-01-28 ENCOUNTER — Ambulatory Visit (AMBULATORY_SURGERY_CENTER): Payer: Self-pay

## 2015-01-28 VITALS — Ht 64.0 in | Wt 193.6 lb

## 2015-01-28 DIAGNOSIS — Z1211 Encounter for screening for malignant neoplasm of colon: Secondary | ICD-10-CM

## 2015-01-28 MED ORDER — MOVIPREP 100 G PO SOLR: 1.0000 | Freq: Once | ORAL | Status: DC

## 2015-01-28 NOTE — Progress Notes (Signed)
No allergies to eggs or soy No past problems with anesthesia except PONV and "takes me awhile to get over" No home oxygen No diet/weight loss meds  Refused emmi video

## 2015-02-10 ENCOUNTER — Ambulatory Visit (AMBULATORY_SURGERY_CENTER): Payer: BC Managed Care – PPO | Admitting: Gastroenterology

## 2015-02-10 ENCOUNTER — Encounter: Payer: Self-pay | Admitting: Gastroenterology

## 2015-02-10 VITALS — BP 143/74 | HR 64 | Temp 98.3°F | Resp 19 | Ht 64.0 in | Wt 193.0 lb

## 2015-02-10 DIAGNOSIS — Z1211 Encounter for screening for malignant neoplasm of colon: Secondary | ICD-10-CM

## 2015-02-10 MED ORDER — SODIUM CHLORIDE 0.9 % IV SOLN: 500.0000 mL | INTRAVENOUS | Status: DC

## 2015-02-10 NOTE — Patient Instructions (Signed)
YOU HAD AN ENDOSCOPIC PROCEDURE TODAY AT THE [facility]:   Refer to the procedure report that was given to you for any specific questions about what was found during the examination.  If the procedure report does not answer your questions, please call your gastroenterologist to clarify.  If you requested that your care partner not be given the details of your procedure findings, then the procedure report has been included in a sealed envelope for you to review at your convenience later.  YOU SHOULD EXPECT: Some feelings of bloating in the abdomen. Passage of more gas than usual.  Walking can help get rid of the air that was put into your GI tract during the procedure and reduce the bloating. If you had a lower endoscopy (such as a colonoscopy or flexible sigmoidoscopy) you may notice spotting of blood in your stool or on the toilet paper. If you underwent a bowel prep for your procedure, you may not have a normal bowel movement for a few days.  Please Note:  You might notice some irritation and congestion in your nose or some drainage.  This is from the oxygen used during your procedure.  There is no need for concern and it should clear up in a day or so.  SYMPTOMS TO REPORT IMMEDIATELY:   Following lower endoscopy (colonoscopy or flexible sigmoidoscopy):  Excessive amounts of blood in the stool  Significant tenderness or worsening of abdominal pains  Swelling of the abdomen that is new, acute  Fever of 100F or higher   For urgent or emergent issues, a gastroenterologist can be reached at any hour by calling [phone].   DIET: Your first meal following the procedure should be a small meal and then it is ok to progress to your normal diet. Heavy or fried foods are harder to digest and may make you feel nauseous or bloated.  Likewise, meals heavy in dairy and vegetables can increase bloating.  Drink plenty of fluids but you should avoid alcoholic beverages for 24  hours.  ACTIVITY:  You should plan to take it easy for the rest of today and you should NOT DRIVE or use heavy machinery until tomorrow (because of the sedation medicines used during the test).    FOLLOW UP: Our staff will call the number listed on your records the next business day following your procedure to check on you and address any questions or concerns that you may have regarding the information given to you following your procedure. If we do not reach you, we will leave a message.  However, if you are feeling well and you are not experiencing any problems, there is no need to return our call.  We will assume that you have returned to your regular daily activities without incident.  If any biopsies were taken you will be contacted by phone or by letter within the next 1-3 weeks.  Please call us at [phone] if you have not heard about the biopsies in 3 weeks.    SIGNATURES/CONFIDENTIALITY: You and/or your care partner have signed paperwork which will be entered into your electronic medical record.  These signatures attest to the fact that that the information above on your After Visit Summary has been reviewed and is understood.  Full responsibility of the confidentiality of this discharge information lies with you and/or your care-partner. 

## 2015-02-10 NOTE — Progress Notes (Signed)
Transferred to recovery room. A/O x3, pleased with MAC.  VSS.  Report to Karen, RN. 

## 2015-02-10 NOTE — Op Note (Signed)
[place]  [address] [place], [id number]   COLONOSCOPY PROCEDURE REPORT  PATIENT: Rachael, Nguyen  MR#: [id number] BIRTHDATE: Apr 06, 1951 , 63  yrs. old GENDER: female ENDOSCOPIST: Milus Banister, MD REFERRED BY: Claretta Fraise, MD PROCEDURE DATE:  02/10/2015 PROCEDURE:   Colonoscopy, screening First Screening Colonoscopy - Avg.  risk and is 50 yrs.  old or older Yes.  Prior Negative Screening - Now for repeat screening. N/A  History of Adenoma - Now for follow-up colonoscopy & has been > or = to 3 yrs.  N/A  Recommend repeat exam, <10 yrs? No ASA CLASS:   Class II INDICATIONS:Screening for colonic neoplasia and Colorectal Neoplasm Risk Assessment for this procedure is average risk. MEDICATIONS: Monitored anesthesia care and Propofol 175 mg IV  DESCRIPTION OF PROCEDURE:   After the risks benefits and alternatives of the procedure were thoroughly explained, informed consent was obtained.  The digital rectal exam revealed no abnormalities of the rectum.   The LB [id number]  endoscope was introduced through the anus and advanced to the cecum, which was identified by both the appendix and ileocecal valve. No adverse events experienced.   The quality of the prep was excellent.  The instrument was then slowly withdrawn as the colon was fully examined. Estimated blood loss is zero unless otherwise noted in this procedure report.   COLON FINDINGS: There was mild diverticulosis noted throughout the entire examined colon.   The examination was otherwise normal. Retroflexed views revealed no abnormalities. The time to cecum = 2.5 Withdrawal time = 6.0   The scope was withdrawn and the procedure completed. COMPLICATIONS: There were no immediate complications.  ENDOSCOPIC IMPRESSION: 1.   Mild diverticulosis was noted throughout the entire examined colon 2.   The examination was otherwise normal; No polyps or cancers  RECOMMENDATIONS: You should continue to  follow colorectal cancer screening guidelines for "routine risk" patients with a repeat colonoscopy in 10 years.   eSigned:  Milus Banister, MD 02/10/2015 2:31 PM

## 2015-02-11 ENCOUNTER — Telehealth: Payer: Self-pay | Admitting: *Deleted

## 2015-02-11 NOTE — Telephone Encounter (Signed)
  Follow up Call-  Call back number 02/10/2015  Post procedure Call Back phone  # [phone]  Permission to leave phone message Yes     Patient questions:  Do you have a fever, pain , or abdominal swelling? No. Pain Score  0 *  Have you tolerated food without any problems? Yes.    Have you been able to return to your normal activities? Yes.    Do you have any questions about your discharge instructions: Diet   No. Medications  No. Follow up visit  No.  Do you have questions or concerns about your Care? No.  Actions: * If pain score is 4 or above: No action needed, pain <4.

## 2015-03-03 ENCOUNTER — Telehealth: Payer: Self-pay | Admitting: Family Medicine

## 2015-03-03 DIAGNOSIS — E785 Hyperlipidemia, unspecified: Secondary | ICD-10-CM

## 2015-03-03 MED ORDER — PRAVASTATIN SODIUM 40 MG PO TABS: 40.0000 mg | ORAL_TABLET | Freq: Every day | ORAL | Status: DC

## 2015-03-03 NOTE — Telephone Encounter (Signed)
done

## 2015-03-13 ENCOUNTER — Ambulatory Visit (INDEPENDENT_AMBULATORY_CARE_PROVIDER_SITE_OTHER): Payer: BC Managed Care – PPO | Admitting: Gynecology

## 2015-03-13 ENCOUNTER — Other Ambulatory Visit (HOSPITAL_COMMUNITY)
Admission: RE | Admit: 2015-03-13 | Discharge: 2015-03-13 | Disposition: A | Discharge status: Home / Self Care | Payer: BC Managed Care – PPO | Source: Ambulatory Visit | Attending: Gynecology | Admitting: Gynecology

## 2015-03-13 ENCOUNTER — Encounter: Payer: Self-pay | Admitting: Gynecology

## 2015-03-13 VITALS — BP 160/78 | Ht 64.0 in | Wt 199.0 lb

## 2015-03-13 DIAGNOSIS — Z1151 Encounter for screening for human papillomavirus (HPV): Secondary | ICD-10-CM | POA: Insufficient documentation

## 2015-03-13 DIAGNOSIS — Z01419 Encounter for gynecological examination (general) (routine) without abnormal findings: Secondary | ICD-10-CM

## 2015-03-13 DIAGNOSIS — M8580 Other specified disorders of bone density and structure, unspecified site: Secondary | ICD-10-CM

## 2015-03-13 NOTE — Progress Notes (Signed)
Rachael Nguyen [birth year] [id number]        64 y.o.  G0P0000 for annual exam.  Has not been in the office for 4 years. Doing well without complaints.  Past medical history,surgical history, problem list, medications, allergies, family history and social history were all reviewed and documented as reviewed in the EPIC chart.  ROS:  Performed with pertinent positives and negatives included in the history, assessment and plan.   Additional significant findings :  none   Exam: Leanne Lovely Vitals:   03/13/15 1045  BP: 160/78  Height: 5\' 4"  (1.626 m)  Weight: 199 lb (90.266 kg)   General appearance:  Normal affect, orientation and appearance. Skin: Grossly normal HEENT: Without gross lesions.  No cervical or supraclavicular adenopathy. Thyroid normal.  Lungs:  Clear without wheezing, rales or rhonchi Cardiac: RR, without RMG Abdominal:  Soft, nontender, without masses, guarding, rebound, organomegaly or hernia Breasts:  Examined lying and sitting without masses, retractions, discharge or axillary adenopathy. Pelvic:  Ext/BUS/vagina with atrophic changes  Cervix with atrophic changes. Pap smear/HPV done  Uterus axial to anteverted, normal size, shape and contour, midline and mobile nontender   Adnexa  Without masses or tenderness    Anus and perineum  Normal   Rectovaginal  Normal sphincter tone without palpated masses or tenderness.    Assessment/Plan:  64 y.o. G0P0000 female for annual exam.   1. Postmenopausal/atrophic genital changes. Without significant symptoms of hot flushes, night sweats, vaginal dryness or any vaginal bleeding. Continue to monitor and report any issues or bleeding. 2. Osteopenia.  The patient reports being told that her bones were a little bit weak previously on bone density. It's been years since she has had this done. Offered to schedule DEXA now but she declined and said should rather call her physician's office in [place] where the last one  was done and have it done there and I encouraged her to do so.  Review of her blood work does show with vitamin D 32. I've encouraged her to supplement this with 1000 units daily to try to get it in the mid normal range. 3. Pap smear 2012. Pap smear/HPV today. No history of significant abnormal Pap smears previously. 4. Mammography coming due in October not reminded her to schedule this. SBE monthly reviewed. 5. Colonoscopy 02/2015. Repeat at their recommended interval. 6. Health maintenance. Blood pressure 160/78. Patient reports always elevated at doctor's offices and she follows it at home and it's normal.  No routine blood work done and she recently had this done at her primary 61 office who is following her for her hypercholesterolemia. Follow up in one year, sooner as needed.   Anastasio Auerbach MD, 11:18 AM 03/13/2015

## 2015-03-13 NOTE — Addendum Note (Signed)
Addended by: Burnett Kanaris on: 03/13/2015 11:27 AM   Modules accepted: Orders

## 2015-03-13 NOTE — Patient Instructions (Signed)

## 2015-03-14 LAB — URINALYSIS W MICROSCOPIC + REFLEX CULTURE
BILIRUBIN URINE: NEGATIVE
Bacteria, UA: NONE SEEN [HPF]
Casts: NONE SEEN [LPF]
GLUCOSE, UA: NEGATIVE
Hgb urine dipstick: NEGATIVE
Ketones, ur: NEGATIVE
LEUKOCYTES UA: NEGATIVE
NITRITE: NEGATIVE
PH: 6 (ref 5.0–8.0)
Protein, ur: NEGATIVE
RBC / HPF: NONE SEEN RBC/HPF (ref <=2)
SPECIFIC GRAVITY, URINE: 1.03 (ref 1.001–1.035)
Squamous Epithelial / LPF: NONE SEEN [HPF] (ref <=5)
WBC UA: NONE SEEN WBC/HPF (ref <=5)
Yeast: NONE SEEN [HPF]

## 2015-03-16 LAB — CYTOLOGY - PAP

## 2015-03-31 ENCOUNTER — Other Ambulatory Visit: Payer: Self-pay

## 2015-03-31 ENCOUNTER — Telehealth: Payer: Self-pay | Admitting: Family Medicine

## 2015-03-31 DIAGNOSIS — Z1231 Encounter for screening mammogram for malignant neoplasm of breast: Secondary | ICD-10-CM

## 2015-04-01 ENCOUNTER — Telehealth: Payer: Self-pay | Admitting: Family Medicine

## 2015-04-01 ENCOUNTER — Other Ambulatory Visit: Payer: Self-pay | Admitting: Family Medicine

## 2015-04-01 DIAGNOSIS — Z780 Asymptomatic menopausal state: Secondary | ICD-10-CM

## 2015-04-01 NOTE — Telephone Encounter (Signed)
Pt scheduled for 04/03/2015 at 11am

## 2015-04-03 ENCOUNTER — Other Ambulatory Visit: Payer: BC Managed Care – PPO

## 2015-04-03 ENCOUNTER — Telehealth: Payer: Self-pay | Admitting: Family Medicine

## 2015-04-07 NOTE — Telephone Encounter (Signed)
LMOM for pt to return call to reschedule her DEXA

## 2015-04-10 NOTE — Telephone Encounter (Signed)
Pt rescheduled for 04/14/2015 at 11am

## 2015-04-14 ENCOUNTER — Ambulatory Visit (INDEPENDENT_AMBULATORY_CARE_PROVIDER_SITE_OTHER): Payer: BC Managed Care – PPO

## 2015-04-14 DIAGNOSIS — Z780 Asymptomatic menopausal state: Secondary | ICD-10-CM

## 2015-04-21 ENCOUNTER — Encounter: Payer: Self-pay | Admitting: Gynecology

## 2015-04-21 ENCOUNTER — Telehealth: Payer: Self-pay | Admitting: Gynecology

## 2015-04-21 NOTE — Telephone Encounter (Signed)
Tell patient that her most recent bone density almost shows osteoporosis. It's right on the borderline. Does show some loss at the right hip.  Recommend office visit either with myself or her primary physician to discuss possible treatment options.

## 2015-04-22 NOTE — Telephone Encounter (Signed)
Pt aware with the below note, will call back to schedule.

## 2015-04-24 ENCOUNTER — Ambulatory Visit
Admission: RE | Admit: 2015-04-24 | Discharge: 2015-04-24 | Disposition: A | Discharge status: Home / Self Care | Payer: BC Managed Care – PPO | Source: Ambulatory Visit

## 2015-04-24 DIAGNOSIS — Z1231 Encounter for screening mammogram for malignant neoplasm of breast: Secondary | ICD-10-CM

## 2015-06-04 ENCOUNTER — Ambulatory Visit (INDEPENDENT_AMBULATORY_CARE_PROVIDER_SITE_OTHER): Payer: BC Managed Care – PPO | Admitting: Family Medicine

## 2015-06-04 ENCOUNTER — Ambulatory Visit: Payer: BC Managed Care – PPO | Admitting: Family Medicine

## 2015-06-04 ENCOUNTER — Ambulatory Visit: Payer: BC Managed Care – PPO

## 2015-06-04 VITALS — BP 153/85 | HR 72 | Temp 97.4°F | Ht 64.0 in | Wt 213.8 lb

## 2015-06-04 DIAGNOSIS — M8580 Other specified disorders of bone density and structure, unspecified site: Secondary | ICD-10-CM | POA: Diagnosis not present

## 2015-06-04 DIAGNOSIS — J209 Acute bronchitis, unspecified: Secondary | ICD-10-CM | POA: Diagnosis not present

## 2015-06-04 DIAGNOSIS — K219 Gastro-esophageal reflux disease without esophagitis: Secondary | ICD-10-CM | POA: Diagnosis not present

## 2015-06-04 MED ORDER — PANTOPRAZOLE SODIUM 40 MG PO TBEC: 40.0000 mg | DELAYED_RELEASE_TABLET | Freq: Every day | ORAL | Status: DC

## 2015-06-04 MED ORDER — AZITHROMYCIN 250 MG PO TABS: ORAL_TABLET | ORAL | Status: DC

## 2015-06-04 MED ORDER — BENZONATATE 100 MG PO CAPS: 100.0000 mg | ORAL_CAPSULE | Freq: Two times a day (BID) | ORAL | Status: DC | PRN

## 2015-06-04 NOTE — Patient Instructions (Signed)
Great to meet you!  Come back in 3 months to check on your cough  Start protonix for reflux and to let your larynx (voice box) heal.   I have prescribed azithromycin  Continue Vitamin D, Try citracal, Consider weight bearing exercise.   Acute Bronchitis Bronchitis is when the airways that extend from the windpipe into the lungs get red, puffy, and painful (inflamed). Bronchitis often causes thick spit (mucus) to develop. This leads to a cough. A cough is the most common symptom of bronchitis. In acute bronchitis, the condition usually begins suddenly and goes away over time (usually in 2 weeks). Smoking, allergies, and asthma can make bronchitis worse. Repeated episodes of bronchitis may cause more lung problems. HOME CARE  Rest.  Drink enough fluids to keep your pee (urine) clear or pale yellow (unless you need to limit fluids as told by your doctor).  Only take over-the-counter or prescription medicines as told by your doctor.  Avoid smoking and secondhand smoke. These can make bronchitis worse. If you are a smoker, think about using nicotine gum or skin patches. Quitting smoking will help your lungs heal faster.  Reduce the chance of getting bronchitis again by:  Washing your hands often.  Avoiding people with cold symptoms.  Trying not to touch your hands to your mouth, nose, or eyes.  Follow up with your doctor as told. GET HELP IF: Your symptoms do not improve after 1 week of treatment. Symptoms include:  Cough.  Fever.  Coughing up thick spit.  Body aches.  Chest congestion.  Chills.  Shortness of breath.  Sore throat. GET HELP RIGHT AWAY IF:   You have an increased fever.  You have chills.  You have severe shortness of breath.  You have bloody thick spit (sputum).  You throw up (vomit) often.  You lose too much body fluid (dehydration).  You have a severe headache.  You faint. MAKE SURE YOU:   Understand these instructions.  Will watch  your condition.  Will get help right away if you are not doing well or get worse.   This information is not intended to replace advice given to you by your health care provider. Make sure you discuss any questions you have with your health care provider.   Document Released: 12/07/2007 Document Revised: 02/20/2013 Document Reviewed: 12/11/2012 Elsevier Interactive Patient Education [employer].

## 2015-06-04 NOTE — Progress Notes (Signed)
   HPI  Patient presents today here with acute illness.  Patient finds that she's had nasal congestion for about 3 weeks and has now developed a cough over the last week. The cough seems to come and fits, is nonproductive, and takes her breath away. She has no dyspnea except when she's coughing. She has no fever, chills. Her appetite is normal.  She has had a cough over the last 4-5 months that she feels is related to her heartburn. She has heartburn symptoms on a daily basis and feelings of reflux. Sometimes she has pain after swallowing. She previously had resolution of symptoms after losing weight but has had symptoms return after gaining some weight back.  He is told by her GYN to look into getting treated for osteoporosis.  PMH: Smoking status noted ROS: Per HPI  Objective: BP 153/85 mmHg  Pulse 72  Temp(Src) 97.4 F (36.3 C) (Oral)  Ht 5\' 4"  (1.626 m)  Wt 213 lb 12.8 oz (96.979 kg)  BMI 36.68 kg/m2 Gen: NAD, alert, cooperative with exam HEENT: NCAT CV: RRR, good S1/S2, no murmur Resp: CTABL, no wheezes, non-labored Abd: SNTND, BS present, no guarding or organomegaly Ext: No edema, warm Neuro: Alert and oriented, No gross deficits  Assessment and plan:  # Acute bronchitis Treat with azithromycin, however believe her cough is multifactorial Tessalon Perles as well Supportive care, return to clinic if worsening or does not improve  # Cough, GERD Multifactorial cough with some chronic portion as well as acute portion Starting Protonix daily 3 months, then try to wean back to H2 blocker Follow-up 3 months unless worsening or does not improve as above  # Osteopenia Recent DEXA scan shows osteopenia very close to osteoporosis Encouraged continued by the ED, she's increased her dose to 2000 units daily Also 1200 mg of calcium, she did not tolerate calcium oxalate changed to calcium citrate   Meds ordered this encounter  Medications  . pantoprazole (PROTONIX) 40  MG tablet    Sig: Take 1 tablet (40 mg total) by mouth daily.    Dispense:  30 tablet    Refill:  3  . azithromycin (ZITHROMAX) 250 MG tablet    Sig: Take 2 tablets on day 1 and 1 tablet daily after that    Dispense:  6 tablet    Refill:  0  . benzonatate (TESSALON) 100 MG capsule    Sig: Take 1 capsule (100 mg total) by mouth 2 (two) times daily as needed for cough.    Dispense:  20 capsule    Refill:  [place], MD [facility] 06/04/2015, 6:06 PM

## 2015-07-07 ENCOUNTER — Telehealth: Payer: Self-pay | Admitting: Family Medicine

## 2015-07-07 NOTE — Telephone Encounter (Signed)
denied °

## 2015-08-25 ENCOUNTER — Other Ambulatory Visit: Payer: Self-pay | Admitting: Family Medicine

## 2015-08-26 NOTE — Telephone Encounter (Signed)
Talked to patient's husband and notified him that patient will need to be seen for further refills

## 2015-08-26 NOTE — Telephone Encounter (Signed)
Last seen 06/04/15 Dr Wendi Snipes  Dr Livia Snellen  PCP  Last lipid 01/23/15

## 2015-08-26 NOTE — Telephone Encounter (Signed)
Authorize 30 days only. Then contact the patient letting them know that they will need an appointment before any further prescriptions can be sent in. 

## 2015-09-03 ENCOUNTER — Ambulatory Visit (INDEPENDENT_AMBULATORY_CARE_PROVIDER_SITE_OTHER): Payer: BC Managed Care – PPO | Admitting: Family Medicine

## 2015-09-03 ENCOUNTER — Encounter: Payer: Self-pay | Admitting: Family Medicine

## 2015-09-03 ENCOUNTER — Encounter (INDEPENDENT_AMBULATORY_CARE_PROVIDER_SITE_OTHER): Payer: Self-pay

## 2015-09-03 VITALS — BP 132/76 | HR 76 | Temp 97.5°F | Ht 64.0 in | Wt 217.6 lb

## 2015-09-03 DIAGNOSIS — K219 Gastro-esophageal reflux disease without esophagitis: Secondary | ICD-10-CM | POA: Diagnosis not present

## 2015-09-03 DIAGNOSIS — E785 Hyperlipidemia, unspecified: Secondary | ICD-10-CM | POA: Diagnosis not present

## 2015-09-03 DIAGNOSIS — M8580 Other specified disorders of bone density and structure, unspecified site: Secondary | ICD-10-CM

## 2015-09-03 DIAGNOSIS — Z0000 Encounter for general adult medical examination without abnormal findings: Secondary | ICD-10-CM | POA: Insufficient documentation

## 2015-09-03 MED ORDER — PRAVASTATIN SODIUM 40 MG PO TABS: ORAL_TABLET | ORAL | Status: DC

## 2015-09-03 MED ORDER — PANTOPRAZOLE SODIUM 40 MG PO TBEC: 40.0000 mg | DELAYED_RELEASE_TABLET | Freq: Every day | ORAL | Status: DC

## 2015-09-03 NOTE — Patient Instructions (Signed)
Great to see you!  Lets see you back in August, Come in 1 week before that for labs  I am so glad the protonix is helping

## 2015-09-03 NOTE — Progress Notes (Signed)
HPI  Patient presents today for follow-up GERD, osteopenia, hyperlipidemia, obesity  GERD, cough Her cough and heartburn resolved quickly after starting Protonix. She is very happy with the results. She also feels like she's getting early CTD due to her topics.  Hyperlipidemia Taking pravastatin regularly They're also cutting back on fried and fatty foods and watching what 8, reduced sugar Trying to start walking again They're both, her and her husband, hoping to lose weight.   PMH: Smoking status noted ROS: Per HPI  Objective: BP 132/76 mmHg  Pulse 76  Temp(Src) 97.5 F (36.4 C) (Oral)  Ht 5' 4" (1.626 m)  Wt 217 lb 9.6 oz (98.703 kg)  BMI 37.33 kg/m2 Gen: NAD, alert, cooperative with exam HEENT: NCAT, TMs normal bilaterally, oropharynx clear CV: RRR, good S1/S2, no murmur Resp: CTABL, no wheezes, non-labored Ext: No edema, warm Neuro: Alert and oriented, No gross deficits  Assessment and plan:  # GERD, GERD related cough Doing well Protonix Try for 5 months, then try Zantac or Pepcid She understands this Follow-up 6 months   # Obesity Discussed weight loss strategies, Atkins diet, Weight Watchers Encouraged her and congratulated her for the positive changes her and her husband have already made.  # Hyperlipidemia Future fasting labs Refill pravastatin   Orders Placed This Encounter  Procedures  . Lipid panel    Standing Status: Future     Number of Occurrences:      Standing Expiration Date: 09/02/2016  . CMP14+EGFR    Standing Status: Future     Number of Occurrences:      Standing Expiration Date: 09/02/2016  . Vitamin D, 25-hydroxy    Standing Status: Future     Number of Occurrences:      Standing Expiration Date: 09/02/2016  . Hepatitis C antibody    Standing Status: Future     Number of Occurrences:      Standing Expiration Date: 09/02/2016  . TSH    Standing Status: Future     Number of Occurrences:      Standing Expiration Date: 09/02/2016      Meds ordered this encounter  Medications  . pravastatin (PRAVACHOL) 40 MG tablet    Sig: TAKE 1 TABLET (40 MG TOTAL) BY MOUTH DAILY.    Dispense:  90 tablet    Refill:  3  . pantoprazole (PROTONIX) 40 MG tablet    Sig: Take 1 tablet (40 mg total) by mouth daily.    Dispense:  90 tablet    Refill:  1    Sam Bradshaw, MD [facility] 09/03/2015, 11:26 AM      

## 2015-11-26 ENCOUNTER — Telehealth: Payer: Self-pay | Admitting: Family Medicine

## 2015-11-27 NOTE — Telephone Encounter (Signed)
Was this increased from 20 to 40mg ? Is it controlling GERD?

## 2015-11-27 NOTE — Telephone Encounter (Signed)
Patient states that she has been taking 40mg  since December and protonix has helped with GERD

## 2015-11-27 NOTE — Telephone Encounter (Signed)
Patient called stating that she has had some stomach pain and diarrhea since sunday.  Patient states she thinks that pain and diarrhea is coming from the protonix.

## 2015-11-27 NOTE — Telephone Encounter (Signed)
If pt has been taking protonix since December, abd pain and diarrhea is not related. Pt will need to be seen

## 2015-12-01 ENCOUNTER — Encounter: Payer: Self-pay | Admitting: Family Medicine

## 2015-12-01 ENCOUNTER — Ambulatory Visit (INDEPENDENT_AMBULATORY_CARE_PROVIDER_SITE_OTHER): Payer: BC Managed Care – PPO | Admitting: Family Medicine

## 2015-12-01 VITALS — BP 122/78 | HR 77 | Temp 97.1°F | Ht 64.0 in | Wt 213.0 lb

## 2015-12-01 DIAGNOSIS — L304 Erythema intertrigo: Secondary | ICD-10-CM

## 2015-12-01 DIAGNOSIS — K219 Gastro-esophageal reflux disease without esophagitis: Secondary | ICD-10-CM | POA: Diagnosis not present

## 2015-12-01 DIAGNOSIS — R195 Other fecal abnormalities: Secondary | ICD-10-CM

## 2015-12-01 DIAGNOSIS — K5730 Diverticulosis of large intestine without perforation or abscess without bleeding: Secondary | ICD-10-CM | POA: Insufficient documentation

## 2015-12-01 MED ORDER — NYSTATIN 100000 UNIT/GM EX CREA: 1.0000 "application " | TOPICAL_CREAM | Freq: Two times a day (BID) | CUTANEOUS | Status: DC

## 2015-12-01 MED ORDER — FLUCONAZOLE 150 MG PO TABS: 150.0000 mg | ORAL_TABLET | Freq: Once | ORAL | Status: DC

## 2015-12-01 NOTE — Patient Instructions (Signed)
Great to see you again!  Try zantac or pepcid twice daily for your symptoms instead of protonix  For the rash have prescribed nystatin cream, I have also sent Diflucan, an antifungal pill. Take 1 pill and repeat in one week if symptoms have not completely resolved.  Intertrigo Intertrigo is a skin irritation (inflammation) that happens in warm, moist areas of the body. It happens mostly between folds of skin or where skin rubs together. HOME CARE  Keep the affected area cool and dry.  Leave the skin folds open to air.  Put cotton or linen between the folds of skin.  Avoid tight clothing.  Wear open-toed shoes or sandals.  Use powder on the affected area as told by your doctor.  Only use medicated creams or pastes as told by your doctor. GET HELP RIGHT AWAY IF:  The rash does not get better after 1 week of treatment.  The rash gets worse.  You have a fever or chills. MAKE SURE YOU:  Understand these instructions.  Will watch your condition.  Will get help right away if you are not doing well or get worse.   This information is not intended to replace advice given to you by your health care provider. Make sure you discuss any questions you have with your health care provider.   Document Released: 07/23/2010 Document Revised: 09/12/2011 Document Reviewed: 12/22/2014 Elsevier Interactive Patient Education [employer].

## 2015-12-01 NOTE — Progress Notes (Signed)
   HPI  Patient presents today here to discuss rash and loose stools.  Loose stools Abdomen: On for a few weeks. Some mild crampy abdominal pain that has been transient. She was concerned that is associated with Protonix, she stopped the medication and the symptoms resolved. She has been on Protonix for about 5 months for GERD and GERD related cough. It has worked very well for the cough.  Being off the medication for about a week she has had a return of the mild cough as well as burning type sensation in the throat and repeat with throat clearing.  Rash Happens intermittently, currently has been going on for about one month, usually it improves with hydrocortisone, located under the bilateral breasts or bilateral inguinal folds. described as mildly itchy beefy red rash Has been present for 1 month now  PMH: Smoking status noted ROS: Per HPI  Objective: BP 122/78 mmHg  Pulse 77  Temp(Src) 97.1 F (36.2 C) (Oral)  Ht 5\' 4"  (1.626 m)  Wt 213 lb (96.616 kg)  BMI 36.54 kg/m2 Gen: NAD, alert, cooperative with exam HEENT: NCAT CV: RRR, good S1/S2, no murmur Resp: CTABL, no wheezes, non-labored Ext: No edema, warm Neuro: Alert and oriented, No gross deficits  Assessment and plan:  # Loose stools Unclear etiology, improved after stopping PPI Possibly PPI related Continue to monitor  #GERD Slightly worse after stopping PPI Try twice a day Pepcid instead She does have GERD related cough as well, she does not have improvement with this would consider omeprazole or dexilant instead  #intertrigo Consistent with her history. Diflucan 2, nystatin cream Discussed strategies for decreasing incidence   Meds ordered this encounter  Medications  . nystatin cream (MYCOSTATIN)    Sig: Apply 1 application topically 2 (two) times daily.    Dispense:  30 g    Refill:  2  . fluconazole (DIFLUCAN) 150 MG tablet    Sig: Take 1 tablet (150 mg total) by mouth once. Repeat in 1 week if  symptoms persistent    Dispense:  2 tablet    Refill:  0    Laroy Apple, MD [facility] 12/01/2015, 1:25 PM

## 2015-12-03 ENCOUNTER — Telehealth: Payer: Self-pay | Admitting: *Deleted

## 2015-12-03 MED ORDER — KETOCONAZOLE 2 % EX CREA: 1.0000 "application " | TOPICAL_CREAM | Freq: Every day | CUTANEOUS | Status: DC

## 2015-12-03 NOTE — Telephone Encounter (Signed)
Pt notified of Dr Alen Bleacher recommendation Rachael Nguyen understanding She will call back if sxs persist

## 2015-12-03 NOTE — Telephone Encounter (Signed)
I will send ketoconazole cream, that is not a typical reaction. If she is having any weakness persistent dizziness that is concerning she may need to be seen at urgent care (we are closed today).   Its so atyical I would consider vertigo, consider reviewing youtube video for epley's maneuvers.   Laroy Apple, MD [facility] 12/03/2015, 11:20 AM

## 2015-12-03 NOTE — Telephone Encounter (Signed)
Per pt Nystatin cream is causing severe dizziness Will you change to something else Please review and advise

## 2016-02-19 ENCOUNTER — Ambulatory Visit (INDEPENDENT_AMBULATORY_CARE_PROVIDER_SITE_OTHER): Payer: BC Managed Care – PPO | Admitting: Family Medicine

## 2016-02-19 ENCOUNTER — Encounter: Payer: Self-pay | Admitting: Family Medicine

## 2016-02-19 VITALS — BP 140/80 | HR 63 | Temp 98.0°F | Ht 64.0 in | Wt 211.4 lb

## 2016-02-19 DIAGNOSIS — E785 Hyperlipidemia, unspecified: Secondary | ICD-10-CM | POA: Diagnosis not present

## 2016-02-19 DIAGNOSIS — Z0000 Encounter for general adult medical examination without abnormal findings: Secondary | ICD-10-CM

## 2016-02-19 MED ORDER — PRAVASTATIN SODIUM 40 MG PO TABS: ORAL_TABLET | ORAL | 3 refills | Status: DC

## 2016-02-19 NOTE — Patient Instructions (Signed)
Great to see you!  Keep tryiong to watch your diet! You are doing great staying active at home woprking in your yard.   Try to start walking at a quick pace for 220-30 minutes 4-5 days a week  Come back in 1 year unless you need [place] sooner.

## 2016-02-19 NOTE — Progress Notes (Signed)
   HPI  Patient presents today for follow-up for hyperlipidemia  Her lipidemia Reports good medication compliance and no side effects. She has been worried lately as she her to the patient that developed cirrhosis after taking statins. She is watching her diet moderately. She is active outside throughout the summer, however has no formal exercise routine.  Obesity Exercise and diet as above, no formal exercise.  Patient requests hepatitis C testing today as we have discussed previously  PMH: Smoking status noted ROS: Per HPI  Objective: BP 140/80   Pulse 63   Temp 98 F (36.7 C) (Oral)   Ht '5\' 4"'$  (1.626 m)   Wt 211 lb 6 oz (95.9 kg)   BMI 36.28 kg/m  Gen: NAD, alert, cooperative with exam HEENT: NCAT CV: RRR, good S1/S2, no murmur Resp: CTABL, no wheezes, non-labored Abd: SNTND, BS present, no guarding or organomegaly Ext: No edema, warm Neuro: Alert and oriented  Assessment and plan:  # Hyperlipidemia Likely stable, rechecking labs. Discussed therapeutic lifestyle changes Refilled statin  # Morbid obesity Discussed diet and exercise changes  # Healthcare maintenance Hep c today     Orders Placed This Encounter  Procedures  . CMP14+EGFR  . CBC with Differential/Platelet  . Lipid panel  . TSH  . Hepatitis C antibody    Laroy Apple, MD [facility] 02/19/2016, 8:34 AM

## 2016-02-20 LAB — CMP14+EGFR
ALBUMIN: 3.9 g/dL (ref 3.6–4.8)
ALT: 15 IU/L (ref 0–32)
AST: 17 IU/L (ref 0–40)
Albumin/Globulin Ratio: 1.5 (ref 1.2–2.2)
Alkaline Phosphatase: 79 IU/L (ref 39–117)
BILIRUBIN TOTAL: 0.7 mg/dL (ref 0.0–1.2)
BUN / CREAT RATIO: 20 (ref 12–28)
BUN: 14 mg/dL (ref 8–27)
CO2: 25 mmol/L (ref 18–29)
CREATININE: 0.7 mg/dL (ref 0.57–1.00)
Calcium: 9 mg/dL (ref 8.7–10.3)
Chloride: 103 mmol/L (ref 96–106)
GFR calc non Af Amer: 93 mL/min/{1.73_m2} (ref >59)
GFR, EST AFRICAN AMERICAN: 107 mL/min/{1.73_m2} (ref >59)
GLOBULIN, TOTAL: 2.6 g/dL (ref 1.5–4.5)
Glucose: 86 mg/dL (ref 65–99)
Potassium: 4 mmol/L (ref 3.5–5.2)
Sodium: 144 mmol/L (ref 134–144)
TOTAL PROTEIN: 6.5 g/dL (ref 6.0–8.5)

## 2016-02-20 LAB — LIPID PANEL
CHOL/HDL RATIO: 2.7 ratio (ref 0.0–4.4)
Cholesterol, Total: 141 mg/dL (ref 100–199)
HDL: 53 mg/dL (ref >39)
LDL CALC: 72 mg/dL (ref 0–99)
Triglycerides: 81 mg/dL (ref 0–149)
VLDL Cholesterol Cal: 16 mg/dL (ref 5–40)

## 2016-02-20 LAB — CBC WITH DIFFERENTIAL/PLATELET
Basophils Absolute: 0 10*3/uL (ref 0.0–0.2)
Basos: 1 %
EOS (ABSOLUTE): 0.1 10*3/uL (ref 0.0–0.4)
EOS: 3 %
HEMATOCRIT: 39.5 % (ref 34.0–46.6)
HEMOGLOBIN: 12.9 g/dL (ref 11.1–15.9)
Immature Grans (Abs): 0 10*3/uL (ref 0.0–0.1)
Immature Granulocytes: 0 %
LYMPHS ABS: 2 10*3/uL (ref 0.7–3.1)
Lymphs: 45 %
MCH: 28.4 pg (ref 26.6–33.0)
MCHC: 32.7 g/dL (ref 31.5–35.7)
MCV: 87 fL (ref 79–97)
MONOCYTES: 6 %
Monocytes Absolute: 0.3 10*3/uL (ref 0.1–0.9)
NEUTROS ABS: 2 10*3/uL (ref 1.4–7.0)
Neutrophils: 45 %
Platelets: 242 10*3/uL (ref 150–379)
RBC: 4.55 x10E6/uL (ref 3.77–5.28)
RDW: 15.2 % (ref 12.3–15.4)
WBC: 4.4 10*3/uL (ref 3.4–10.8)

## 2016-02-20 LAB — TSH: TSH: 2.44 u[IU]/mL (ref 0.450–4.500)

## 2016-02-20 LAB — HEPATITIS C ANTIBODY

## 2016-03-16 ENCOUNTER — Encounter: Payer: Self-pay | Admitting: Family Medicine

## 2016-03-16 ENCOUNTER — Ambulatory Visit (INDEPENDENT_AMBULATORY_CARE_PROVIDER_SITE_OTHER): Payer: BC Managed Care – PPO | Admitting: Family Medicine

## 2016-03-16 VITALS — BP 132/75 | HR 90 | Temp 97.5°F | Ht 64.0 in | Wt 204.2 lb

## 2016-03-16 DIAGNOSIS — R05 Cough: Secondary | ICD-10-CM | POA: Diagnosis not present

## 2016-03-16 DIAGNOSIS — R059 Cough, unspecified: Secondary | ICD-10-CM

## 2016-03-16 MED ORDER — FLUTICASONE PROPIONATE 50 MCG/ACT NA SUSP: 2.0000 | Freq: Every day | NASAL | 1 refills | Status: DC

## 2016-03-16 MED ORDER — BENZONATATE 200 MG PO CAPS: 200.0000 mg | ORAL_CAPSULE | Freq: Two times a day (BID) | ORAL | 0 refills | Status: DC | PRN

## 2016-03-16 NOTE — Patient Instructions (Signed)
Great to see you!  I believe you cough is from throat irritation and [post nasal drip, try the tessalon and flonase. Keep flonase going for at least 2 weeks  If you do not get better we may ned to re-start the protonix.

## 2016-03-16 NOTE — Progress Notes (Signed)
   HPI  Patient presents today here with cough  Patient states her last 6 days she's had persistent cough, started a sore throat, nasal drainage, and malaise. She feels better, her sore throat is improved, and she has dried up the rhinorrhea with Sudafed, however she has persistent cough.  Patient explains that it's a repeat of a cough she developed last year described as a tickle in her throat that leads to coughing fits.  She denies shortness of breath, fever, chills, sweats.  She's tolerating food and fluids normally.  She's taking Sudafed only  Asked time she had good improvement after steroid injection.  PMH: Smoking status noted ROS: Per HPI  Objective: BP 132/75 (BP Location: Right Arm, Patient Position: Sitting, Cuff Size: Normal)   Pulse 90   Temp 97.5 F (36.4 C) (Oral)   Ht 5\' 4"  (1.626 m)   Wt 204 lb 3.2 oz (92.6 kg)   BMI 35.05 kg/m  Gen: NAD, alert, cooperative with exam HEENT: NCAT, nares clear, oropharynx clear with mild erythema patchy on the posterior pharynx, CV: RRR, good S1/S2, no murmur Resp: CTABL, no wheezes, non-labored Ext: No edema, warm Neuro: Alert and oriented, No gross deficits  Assessment and plan:  # Cough Most likely pharyngeal irritation from recent cold, and possibly postnasal drip Flonase, Tessalon Perles, tincture of time Return to clinic if worsening, consider escalating GERD treatment back to Protonix if needed She had good improvement after a few weeks of PPI last time, , however she also had Tessalon steroids, and azithromycin during that period time.   Meds ordered this encounter  Medications  . CALCIUM PO    Sig: Take by mouth 2 (two) times daily.  . benzonatate (TESSALON) 200 MG capsule    Sig: Take 1 capsule (200 mg total) by mouth 2 (two) times daily as needed for cough.    Dispense:  20 capsule    Refill:  0  . fluticasone (FLONASE) 50 MCG/ACT nasal spray    Sig: Place 2 sprays into both nostrils daily.   Dispense:  16 g    Refill:  Titus, MD [facility] 03/16/2016, 9:23 AM

## 2016-04-01 ENCOUNTER — Other Ambulatory Visit: Payer: Self-pay | Admitting: Gynecology

## 2016-04-01 DIAGNOSIS — Z1231 Encounter for screening mammogram for malignant neoplasm of breast: Secondary | ICD-10-CM

## 2016-04-25 ENCOUNTER — Ambulatory Visit
Admission: RE | Admit: 2016-04-25 | Discharge: 2016-04-25 | Disposition: A | Discharge status: Home / Self Care | Payer: BC Managed Care – PPO | Source: Ambulatory Visit | Attending: Gynecology | Admitting: Gynecology

## 2016-04-25 DIAGNOSIS — Z1231 Encounter for screening mammogram for malignant neoplasm of breast: Secondary | ICD-10-CM

## 2016-06-22 ENCOUNTER — Other Ambulatory Visit: Payer: Self-pay | Admitting: Family Medicine

## 2016-06-22 MED ORDER — BENZONATATE 100 MG PO CAPS: 200.0000 mg | ORAL_CAPSULE | Freq: Three times a day (TID) | ORAL | 0 refills | Status: DC | PRN

## 2016-06-22 NOTE — Telephone Encounter (Signed)
Pt aware  Rx was sent to pharmacy. 

## 2016-06-22 NOTE — Telephone Encounter (Signed)
Prescription sent to pharmacy.

## 2016-06-29 ENCOUNTER — Telehealth: Payer: Self-pay | Admitting: Family Medicine

## 2016-06-29 MED ORDER — BENZONATATE 200 MG PO CAPS: 200.0000 mg | ORAL_CAPSULE | Freq: Two times a day (BID) | ORAL | 0 refills | Status: DC | PRN

## 2016-06-29 MED ORDER — AZITHROMYCIN 250 MG PO TABS: ORAL_TABLET | ORAL | 0 refills | Status: DC

## 2016-06-29 NOTE — Addendum Note (Signed)
Addended by: Terald Sleeper on: 06/29/2016 03:14 PM   Modules accepted: Orders

## 2016-06-29 NOTE — Telephone Encounter (Signed)
Prescription sent to pharmacy.

## 2016-06-29 NOTE — Telephone Encounter (Signed)
Patient called stating that she still has a cough and cold.  Patient states that she received tessalon last week and would like another Rx sent to her pharmacy.  Patient also states that she would like a Z-pak sent in.

## 2016-06-29 NOTE — Telephone Encounter (Signed)
Patient aware medications have been sent to pharmacy.

## 2016-07-28 ENCOUNTER — Encounter: Payer: Self-pay | Admitting: Family

## 2016-07-28 ENCOUNTER — Ambulatory Visit (INDEPENDENT_AMBULATORY_CARE_PROVIDER_SITE_OTHER): Payer: BC Managed Care – PPO | Admitting: Family

## 2016-07-28 VITALS — BP 149/92 | HR 76 | Temp 96.9°F | Ht 64.0 in | Wt 215.2 lb

## 2016-07-28 DIAGNOSIS — R110 Nausea: Secondary | ICD-10-CM

## 2016-07-28 DIAGNOSIS — J301 Allergic rhinitis due to pollen: Secondary | ICD-10-CM

## 2016-07-28 DIAGNOSIS — R42 Dizziness and giddiness: Secondary | ICD-10-CM

## 2016-07-28 MED ORDER — ONDANSETRON HCL 4 MG PO TABS: 4.0000 mg | ORAL_TABLET | Freq: Three times a day (TID) | ORAL | 0 refills | Status: DC | PRN

## 2016-07-28 MED ORDER — FLUTICASONE PROPIONATE 50 MCG/ACT NA SUSP: 2.0000 | Freq: Every day | NASAL | 6 refills | Status: DC

## 2016-07-28 NOTE — Patient Instructions (Signed)
Nausea, Adult  Nausea is the feeling of an upset stomach or having to vomit. Nausea on its own is not usually a serious concern, but it may be an early sign of a more serious medical problem. As nausea gets worse, it can lead to vomiting. If vomiting develops, or if you are not able to drink enough fluids, you are at risk of becoming dehydrated. Dehydration can make you tired and thirsty, cause you to have a dry mouth, and decrease how often you urinate. Older adults and people with other diseases or a weak immune system are at higher risk for dehydration. The main goals of treating your nausea are:  · To limit repeated nausea episodes.  · To prevent vomiting and dehydration.    Follow these instructions at home:  Follow instructions from your health care provider about how to care for yourself at home.  Eating and drinking   Follow these recommendations as told by your health care provider:  · Take an oral rehydration solution (ORS). This is a drink that is sold at pharmacies and retail stores.  · Drink clear fluids in small amounts as you are able. Clear fluids include water, ice chips, diluted fruit juice, and low-calorie sports drinks.  · Eat bland, easy-to-digest foods in small amounts as you are able. These foods include bananas, applesauce, rice, lean meats, toast, and crackers.  · Avoid drinking fluids that contain a lot of sugar or caffeine, such as energy drinks, sports drinks, and soda.  · Avoid alcohol.  · Avoid spicy or fatty foods.    General instructions   · Drink enough fluid to keep your urine clear or pale yellow.  · Wash your hands often. If soap and water are not available, use hand sanitizer.  · Make sure that all people in your household wash their hands well and often.  · Rest at home while you recover.  · Take over-the-counter and prescription medicines only as told by your health care provider.  · Breathe slowly and deeply when you feel nauseous.  · Watch your condition for any  changes.  · Keep all follow-up visits as told by your health care provider. This is important.  Contact a health care provider if:  · You have a headache.  · You have new symptoms.  · Your nausea gets worse.  · You have a fever.  · You feel light-headed or dizzy.  · You vomit.  · You cannot keep fluids down.  Get help right away if:  · You have pain in your chest, neck, arm, or jaw.  · You feel extremely weak or you faint.  · You have vomit that is bright red or looks like coffee grounds.  · You have bloody or black stools or stools that look like tar.  · You have a severe headache, a stiff neck, or both.  · You have severe pain, cramping, or bloating in your abdomen.  · You have a rash.  · You have difficulty breathing or are breathing very quickly.  · Your heart is beating very quickly.  · Your skin feels cold and clammy.  · You feel confused.  · You have pain when you urinate.  · You have signs of dehydration, such as:  ? Dark urine, very little, or no urine.  ? Cracked lips.  ? Dry mouth.  ? Sunken eyes.  ? Sleepiness.  ? Weakness.  These symptoms may represent a serious problem that is an emergency. Do   not wait to see if the symptoms will go away. Get medical help right away. Call your local emergency services (911 in the [place] Do not drive yourself to the hospital.  This information is not intended to replace advice given to you by your health care provider. Make sure you discuss any questions you have with your health care provider.  Document Released: 07/28/2004 Document Revised: 11/23/2015 Document Reviewed: 02/24/2015  Elsevier Interactive Patient Education © 2017 Elsevier Inc.

## 2016-07-28 NOTE — Progress Notes (Signed)
   Subjective:    Patient ID: Rachael Nguyen, female    DOB: [birth year], 66 y.o.   MRN: [id number]  HPI Pt presents to the office today with nausea that started over the last two weeks. Pt reports that it comes and goes and can not relate the nausea to anything certain. Pt states several years ago she had a nausea and found it was related to her calcium supplement. PT states she changed to a "Gummy calcium" and the nausea went away. Pt states she stopped her Gummy Calcium two days ago. PT states she can not tell a difference. Pt reports feeling "light headedness" with the nausea and eating helps her light headedness.   Pt does report intermittent sinus drainage over the last several weeks.    Review of Systems  Gastrointestinal: Positive for nausea.  All other systems reviewed and are negative.      Objective:   Physical Exam  Constitutional: She is oriented to person, place, and time. She appears well-developed and well-nourished. No distress.  HENT:  Head: Normocephalic and atraumatic.  Right Ear: External ear normal.  Left Ear: External ear normal.  Nose: Mucosal edema and rhinorrhea present.  Mouth/Throat: Posterior oropharyngeal erythema present.  Eyes: Pupils are equal, round, and reactive to light.  Neck: Normal range of motion. Neck supple. No thyromegaly present.  Cardiovascular: Normal rate, regular rhythm, normal heart sounds and intact distal pulses.   No murmur heard. Pulmonary/Chest: Effort normal and breath sounds normal. No respiratory distress. She has no wheezes.  Abdominal: Soft. Bowel sounds are normal. She exhibits no distension. There is no tenderness.  Musculoskeletal: Normal range of motion. She exhibits no edema or tenderness.  Neurological: She is alert and oriented to person, place, and time. She has normal reflexes. No cranial nerve deficit.  Skin: Skin is warm and dry.  Psychiatric: She has a normal mood and affect. Her behavior is normal. Judgment and  thought content normal.  Vitals reviewed.     BP (!) 158/86 (BP Location: Left Arm, Patient Position: Sitting, Cuff Size: Normal)   Pulse 77   Temp (!) 96.9 F (36.1 C) (Oral)   Ht 5\' 4"  (1.626 m)   Wt 215 lb 3.2 oz (97.6 kg)   BMI 36.94 kg/m      Assessment & Plan:  1. Nausea without vomiting -Bland diet -Force fluids -Start flonase to see if related to post nasal drip Rest - ondansetron (ZOFRAN) 4 MG tablet; Take 1 tablet (4 mg total) by mouth every 8 (eight) hours as needed for nausea or vomiting.  Dispense: 20 tablet; Refill: 0 - BMP - CBC with Differential/Platelet  2. Light headedness BMP - CBC with Differential/Platelet  3. Allergic rhinitis due to pollen, unspecified chronicity, unspecified seasonality - fluticasone (FLONASE) 50 MCG/ACT nasal spray; Place 2 sprays into both nostrils daily.  Dispense: 16 g; Refill: 6 - BMP - CBC with Differential/Platelet  Evelina Dun, FNP

## 2016-07-30 LAB — CBC WITH DIFFERENTIAL/PLATELET
BASOS: 1 %
Basophils Absolute: 0 10*3/uL (ref 0.0–0.2)
EOS (ABSOLUTE): 0.2 10*3/uL (ref 0.0–0.4)
EOS: 3 %
HEMATOCRIT: 39.1 % (ref 34.0–46.6)
Hemoglobin: 12.8 g/dL (ref 11.1–15.9)
Immature Grans (Abs): 0 10*3/uL (ref 0.0–0.1)
Immature Granulocytes: 0 %
Lymphocytes Absolute: 3 10*3/uL (ref 0.7–3.1)
Lymphs: 43 %
MCH: 28.8 pg (ref 26.6–33.0)
MCHC: 32.7 g/dL (ref 31.5–35.7)
MCV: 88 fL (ref 79–97)
MONOS ABS: 0.5 10*3/uL (ref 0.1–0.9)
Monocytes: 8 %
NEUTROS ABS: 3.3 10*3/uL (ref 1.4–7.0)
Neutrophils: 45 %
Platelets: 288 10*3/uL (ref 150–379)
RBC: 4.45 x10E6/uL (ref 3.77–5.28)
RDW: 14.5 % (ref 12.3–15.4)
WBC: 7 10*3/uL (ref 3.4–10.8)

## 2016-07-30 LAB — BMP8+EGFR
BUN/Creatinine Ratio: 25 (ref 12–28)
BUN: 17 mg/dL (ref 8–27)
CO2: 24 mmol/L (ref 18–29)
Calcium: 9.4 mg/dL (ref 8.7–10.3)
Chloride: 101 mmol/L (ref 96–106)
Creatinine, Ser: 0.67 mg/dL (ref 0.57–1.00)
GFR, EST AFRICAN AMERICAN: 108 mL/min/{1.73_m2} (ref >59)
GFR, EST NON AFRICAN AMERICAN: 94 mL/min/{1.73_m2} (ref >59)
Glucose: 92 mg/dL (ref 65–99)
POTASSIUM: 4 mmol/L (ref 3.5–5.2)
SODIUM: 141 mmol/L (ref 134–144)

## 2016-10-06 ENCOUNTER — Encounter: Payer: Self-pay | Admitting: Nurse Practitioner

## 2016-10-06 ENCOUNTER — Ambulatory Visit (INDEPENDENT_AMBULATORY_CARE_PROVIDER_SITE_OTHER): Payer: BC Managed Care – PPO | Admitting: Nurse Practitioner

## 2016-10-06 VITALS — BP 152/84 | HR 75 | Temp 97.2°F | Ht 64.0 in | Wt 225.4 lb

## 2016-10-06 DIAGNOSIS — H1032 Unspecified acute conjunctivitis, left eye: Secondary | ICD-10-CM

## 2016-10-06 MED ORDER — TOBRAMYCIN-DEXAMETHASONE 0.3-0.1 % OP SUSP: 2.0000 [drp] | OPHTHALMIC | 0 refills | Status: DC

## 2016-10-06 NOTE — Patient Instructions (Signed)
Bacterial Conjunctivitis Bacterial conjunctivitis is an infection of your conjunctiva. This is the clear membrane that covers the white part of your eye and the inner surface of your eyelid. This condition can make your eye:  Red or pink.  Itchy. This condition is caused by bacteria. This condition spreads very easily from person to person (is contagious) and from one eye to the other eye. Follow these instructions at home: Medicines   Take or apply your antibiotic medicine as told by your doctor. Do not stop taking or applying the antibiotic even if you start to feel better.  Take or apply over-the-counter and prescription medicines only as told by your doctor.  Do not touch your eyelid with the eye drop bottle or the ointment tube. Managing discomfort   Wipe any fluid from your eye with a warm, wet washcloth or a cotton ball.  Place a cool, clean washcloth on your eye. Do this for 10-20 minutes, 3-4 times per day. General instructions   Do not wear contact lenses until the irritation is gone. Wear glasses until your doctor says it is okay to wear contacts.  Do not wear eye makeup until your symptoms are gone. Throw away any old makeup.  Change or wash your pillowcase every day.  Do not share towels or washcloths with anyone.  Wash your hands often with soap and water. Use paper towels to dry your hands.  Do not touch or rub your eyes.  Do not drive or use heavy machinery if your vision is blurry. Contact a doctor if:  You have a fever.  Your symptoms do not get better after 10 days. Get help right away if:  You have a fever and your symptoms suddenly get worse.  You have very bad pain when you move your eye.  Your face:  Hurts.  Is red.  Is swollen.  You have sudden loss of vision. This information is not intended to replace advice given to you by your health care provider. Make sure you discuss any questions you have with your health care provider. Document  Released: 03/29/2008 Document Revised: 11/26/2015 Document Reviewed: 04/02/2015 Elsevier Interactive Patient Education  2017 [employer].

## 2016-10-06 NOTE — Progress Notes (Signed)
   Subjective:    Patient ID: Rachael Nguyen, female    DOB: [birth year], 66 y.o.   MRN: [id number]  HPI Patient comes in today c/o right eye infection- says that she has been getting this about every 5 years since she was a child- starts on right upper lid and eventually will get into eye. Right eye has been draining.    Review of Systems  Constitutional: Negative.   HENT: Negative.   Eyes: Positive for pain, discharge, redness and visual disturbance.  Respiratory: Negative.   Cardiovascular: Negative.   Neurological: Negative.   Psychiatric/Behavioral: Negative.   All other systems reviewed and are negative.      Objective:   Physical Exam  Constitutional: She appears well-developed and well-nourished.  HENT:  Right Ear: External ear normal.  Left Ear: External ear normal.  Nose: Nose normal.  Mouth/Throat: Oropharynx is clear and moist.  Eyes: Pupils are equal, round, and reactive to light. Right eye exhibits discharge.  Scleral injection with erythematous conjunctivia  Cardiovascular: Normal rate.   Pulmonary/Chest: Effort normal and breath sounds normal.  Skin: Skin is warm.  Psychiatric: She has a normal mood and affect. Her behavior is normal. Judgment and thought content normal.   BP (!) 152/84   Pulse 75   Temp 97.2 F (36.2 C) (Oral)   Ht 5\' 4"  (1.626 m)   Wt 225 lb 6.4 oz (102.2 kg)   BMI 38.69 kg/m         Assessment & Plan:  1. Acute bacterial conjunctivitis of left eye Avoid rubbing eye Cool compresses Good hand washing Current Outpatient Prescriptions on File Prior to Visit  Medication Sig Dispense Refill  . Ascorbic Acid (VITAMIN C ADULT GUMMIES PO) Take by mouth. Takes 2 daily    . CALCIUM PO Take by mouth 2 (two) times daily.    . Cholecalciferol (VITAMIN D3) 1000 UNITS CAPS Take by mouth.    . pravastatin (PRAVACHOL) 40 MG tablet TAKE 1 TABLET (40 MG TOTAL) BY MOUTH DAILY. 90 tablet 3   No current facility-administered medications on file  prior to visit.    RTO prn  Mary-Margaret Hassell Done, FNP

## 2017-02-21 ENCOUNTER — Ambulatory Visit (INDEPENDENT_AMBULATORY_CARE_PROVIDER_SITE_OTHER): Payer: BC Managed Care – PPO | Admitting: Family Medicine

## 2017-02-21 ENCOUNTER — Encounter: Payer: Self-pay | Admitting: Family Medicine

## 2017-02-21 VITALS — BP 123/81 | HR 84 | Temp 98.6°F | Ht 64.0 in | Wt 207.8 lb

## 2017-02-21 DIAGNOSIS — R059 Cough, unspecified: Secondary | ICD-10-CM

## 2017-02-21 DIAGNOSIS — M8580 Other specified disorders of bone density and structure, unspecified site: Secondary | ICD-10-CM

## 2017-02-21 DIAGNOSIS — R05 Cough: Secondary | ICD-10-CM | POA: Diagnosis not present

## 2017-02-21 DIAGNOSIS — E785 Hyperlipidemia, unspecified: Secondary | ICD-10-CM | POA: Diagnosis not present

## 2017-02-21 MED ORDER — PRAVASTATIN SODIUM 40 MG PO TABS: ORAL_TABLET | ORAL | 3 refills | Status: DC

## 2017-02-21 MED ORDER — FLUTICASONE PROPIONATE 50 MCG/ACT NA SUSP: 2.0000 | Freq: Every day | NASAL | 5 refills | Status: DC

## 2017-02-21 NOTE — Progress Notes (Signed)
   HPI  Patient presents today here for follow-up of chronic medical conditions.  Hyperlipidemia Patient not watching her diet as close as previous, however she has been walking on a daily basis for the last 6 weeks or so.  Cough 1-2 weeks of cough, nasal congestion. Improving with 12 hour Sudafed. No fever, chills, sweats.  Osteopenia Due for DEXA scan Taking calcium daily, also taking 2000 units of vitamin D3 daily  PMH: Smoking status noted ROS: Per HPI  Objective: BP 123/81   Pulse 84   Temp 98.6 F (37 C) (Oral)   Ht 5' 4" (1.626 m)   Wt 207 lb 12.8 oz (94.3 kg)   BMI 35.67 kg/m  Gen: NAD, alert, cooperative with exam HEENT: NCAT,, oropharynx clear with slightly enlarged tonsils, nareswith swollen turbinates bilaterally  CV: RRR, good S1/S2, no murmur Resp: CTABL, no wheezes, non-labored Ext: No edema, warm Neuro: Alert and oriented, No gross deficits  Assessment and plan:  # Hyperlipidemia Repeat labs, get pravastatin tolerance Clinically stable   # Osteopenia Discussed Continue vitamin D3 and calcium Repeat DEXA scan after October  # Cough Likely postnasal drip, viral versus allergic rhinitis Start Flonase     Orders Placed This Encounter  Procedures  . DG [facility] DEXA    Order Specific Question:   Reason for Exam (SYMPTOM  OR DIAGNOSIS REQUIRED)    Answer:   screening- osteopenia  . CMP14+EGFR  . Lipid panel  . TSH  . VITAMIN D 25 Hydroxy (Vit-D Deficiency, Fractures)    Meds ordered this encounter  Medications  . pravastatin (PRAVACHOL) 40 MG tablet    Sig: TAKE 1 TABLET (40 MG TOTAL) BY MOUTH DAILY.    Dispense:  90 tablet    Refill:  3  . fluticasone (FLONASE) 50 MCG/ACT nasal spray    Sig: Place 2 sprays into both nostrils daily.    Dispense:  16 g    Refill:  5    Sam Bradshaw, MD [facility] 02/21/2017, 8:55 AM     

## 2017-02-21 NOTE — Patient Instructions (Signed)
Great to see you!  Come back in 6 months unless you need [place] sooner.   Try flonase for your congestion, it is ok to continue sudafed as well.

## 2017-02-22 LAB — CMP14+EGFR
ALT: 10 IU/L (ref 0–32)
AST: 18 IU/L (ref 0–40)
Albumin/Globulin Ratio: 1.6 (ref 1.2–2.2)
Albumin: 4.2 g/dL (ref 3.6–4.8)
Alkaline Phosphatase: 81 IU/L (ref 39–117)
BUN/Creatinine Ratio: 16 (ref 12–28)
BUN: 13 mg/dL (ref 8–27)
Bilirubin Total: 0.7 mg/dL (ref 0.0–1.2)
CALCIUM: 9.6 mg/dL (ref 8.7–10.3)
CHLORIDE: 102 mmol/L (ref 96–106)
CO2: 25 mmol/L (ref 20–29)
Creatinine, Ser: 0.83 mg/dL (ref 0.57–1.00)
GFR calc Af Amer: 87 mL/min/{1.73_m2} (ref >59)
GFR, EST NON AFRICAN AMERICAN: 75 mL/min/{1.73_m2} (ref >59)
Globulin, Total: 2.7 g/dL (ref 1.5–4.5)
Glucose: 85 mg/dL (ref 65–99)
POTASSIUM: 4.3 mmol/L (ref 3.5–5.2)
SODIUM: 141 mmol/L (ref 134–144)
TOTAL PROTEIN: 6.9 g/dL (ref 6.0–8.5)

## 2017-02-22 LAB — LIPID PANEL
CHOL/HDL RATIO: 2.4 ratio (ref 0.0–4.4)
Cholesterol, Total: 129 mg/dL (ref 100–199)
HDL: 53 mg/dL (ref >39)
LDL Calculated: 63 mg/dL (ref 0–99)
Triglycerides: 65 mg/dL (ref 0–149)
VLDL Cholesterol Cal: 13 mg/dL (ref 5–40)

## 2017-02-22 LAB — TSH: TSH: 3.1 u[IU]/mL (ref 0.450–4.500)

## 2017-02-22 LAB — VITAMIN D 25 HYDROXY (VIT D DEFICIENCY, FRACTURES): VIT D 25 HYDROXY: 51.1 ng/mL (ref 30.0–100.0)

## 2017-02-23 ENCOUNTER — Encounter: Payer: Self-pay | Admitting: Family Medicine

## 2017-03-19 ENCOUNTER — Other Ambulatory Visit: Payer: Self-pay | Admitting: Family Medicine

## 2017-04-14 ENCOUNTER — Other Ambulatory Visit: Payer: BC Managed Care – PPO

## 2017-04-18 ENCOUNTER — Other Ambulatory Visit: Payer: Self-pay | Admitting: Gynecology

## 2017-04-18 ENCOUNTER — Ambulatory Visit (INDEPENDENT_AMBULATORY_CARE_PROVIDER_SITE_OTHER): Payer: BC Managed Care – PPO

## 2017-04-18 DIAGNOSIS — M8588 Other specified disorders of bone density and structure, other site: Secondary | ICD-10-CM | POA: Diagnosis not present

## 2017-04-18 DIAGNOSIS — Z1231 Encounter for screening mammogram for malignant neoplasm of breast: Secondary | ICD-10-CM

## 2017-05-10 ENCOUNTER — Ambulatory Visit: Payer: BC Managed Care – PPO

## 2017-06-06 ENCOUNTER — Ambulatory Visit
Admission: RE | Admit: 2017-06-06 | Discharge: 2017-06-06 | Disposition: A | Discharge status: Home / Self Care | Payer: BC Managed Care – PPO | Source: Ambulatory Visit | Attending: Gynecology | Admitting: Gynecology

## 2017-06-06 DIAGNOSIS — Z1231 Encounter for screening mammogram for malignant neoplasm of breast: Secondary | ICD-10-CM

## 2017-12-02 ENCOUNTER — Telehealth: Payer: Self-pay | Admitting: Family Medicine

## 2017-12-02 NOTE — Telephone Encounter (Signed)
Pt states she is having bilateral lower extremity edema This is a new problem for pt appt scheduled for evaluation

## 2017-12-04 ENCOUNTER — Encounter: Payer: Self-pay | Admitting: Family Medicine

## 2017-12-04 ENCOUNTER — Ambulatory Visit: Payer: BC Managed Care – PPO | Admitting: Family Medicine

## 2017-12-04 DIAGNOSIS — E785 Hyperlipidemia, unspecified: Secondary | ICD-10-CM | POA: Diagnosis not present

## 2017-12-04 DIAGNOSIS — M25473 Effusion, unspecified ankle: Secondary | ICD-10-CM

## 2017-12-04 NOTE — Patient Instructions (Signed)
Great to see you!   

## 2017-12-04 NOTE — Progress Notes (Signed)
   HPI  Patient presents today for ankle swelling, obesity, and hyperlipidemia.  Patient is nonfasting.  Patient states over the weekend she had 2 to 3 days of ankle swelling that improved during the day. She went to a funeral this weekend and stood out in the heat for quite a long time. She is motivated to get back to exercising and watching her diet again.  She is about to be out of school for the year and is looking forward to making the changes.  She does not necessarily want to take medications to lose weight.  Her ankle swelling has resolved today.  PMH: Smoking status noted ROS: Per HPI  Objective: BP (!) 160/86   Pulse 70   Temp 97.9 F (36.6 C) (Oral)   Ht _0  (1.626 m)   Wt 233 lb 6.4 oz (105.9 kg)   BMI 40.06 kg/m  Gen: NAD, alert, cooperative with exam HEENT: NCAT, EOMI, PERRL CV: RRR, good S1/S2, no murmur Resp: CTABL, no wheezes, non-labored Ext: Trace edema bilaterally, symmetric Neuro: Alert and oriented, No gross deficits  Assessment and plan:  #Ankle swelling Likely venous stasis, salt dependent transient ankle swelling Reassurance provided Consider HCTZ if persistent, however right now it is very intermittent  #Morbid obesity Discussed diet and exercise Discussed medications including Contrave and Saxenda*  #Hyperlipidemia Labs today, tolerating pravastatin well    Orders Placed This Encounter  Procedures  . CMP14+EGFR  . CBC with Differential/Platelet  . Lipid panel  . TSH     Laroy Apple, MD [facility] 12/04/2017, 9:31 AM

## 2017-12-05 ENCOUNTER — Encounter: Payer: Self-pay | Admitting: Family Medicine

## 2017-12-05 LAB — CBC WITH DIFFERENTIAL/PLATELET
Basophils Absolute: 0 10*3/uL (ref 0.0–0.2)
Basos: 1 %
EOS (ABSOLUTE): 0.2 10*3/uL (ref 0.0–0.4)
Eos: 3 %
HEMOGLOBIN: 12.4 g/dL (ref 11.1–15.9)
Hematocrit: 39.3 % (ref 34.0–46.6)
IMMATURE GRANS (ABS): 0 10*3/uL (ref 0.0–0.1)
IMMATURE GRANULOCYTES: 0 %
LYMPHS: 37 %
Lymphocytes Absolute: 1.8 10*3/uL (ref 0.7–3.1)
MCH: 28.4 pg (ref 26.6–33.0)
MCHC: 31.6 g/dL (ref 31.5–35.7)
MCV: 90 fL (ref 79–97)
MONOCYTES: 5 %
Monocytes Absolute: 0.3 10*3/uL (ref 0.1–0.9)
NEUTROS ABS: 2.6 10*3/uL (ref 1.4–7.0)
Neutrophils: 54 %
Platelets: 289 10*3/uL (ref 150–450)
RBC: 4.36 x10E6/uL (ref 3.77–5.28)
RDW: 15.3 % (ref 12.3–15.4)
WBC: 4.8 10*3/uL (ref 3.4–10.8)

## 2017-12-05 LAB — CMP14+EGFR
ALT: 15 IU/L (ref 0–32)
AST: 13 IU/L (ref 0–40)
Albumin/Globulin Ratio: 1.8 (ref 1.2–2.2)
Albumin: 4.1 g/dL (ref 3.6–4.8)
Alkaline Phosphatase: 70 IU/L (ref 39–117)
BUN/Creatinine Ratio: 17 (ref 12–28)
BUN: 13 mg/dL (ref 8–27)
Bilirubin Total: 0.5 mg/dL (ref 0.0–1.2)
CO2: 23 mmol/L (ref 20–29)
Calcium: 9.4 mg/dL (ref 8.7–10.3)
Chloride: 105 mmol/L (ref 96–106)
Creatinine, Ser: 0.75 mg/dL (ref 0.57–1.00)
GFR calc non Af Amer: 85 mL/min/{1.73_m2} (ref >59)
GFR, EST AFRICAN AMERICAN: 97 mL/min/{1.73_m2} (ref >59)
Globulin, Total: 2.3 g/dL (ref 1.5–4.5)
Glucose: 94 mg/dL (ref 65–99)
Potassium: 3.8 mmol/L (ref 3.5–5.2)
Sodium: 142 mmol/L (ref 134–144)
TOTAL PROTEIN: 6.4 g/dL (ref 6.0–8.5)

## 2017-12-05 LAB — LIPID PANEL
Chol/HDL Ratio: 2.5 ratio (ref 0.0–4.4)
Cholesterol, Total: 152 mg/dL (ref 100–199)
HDL: 62 mg/dL (ref >39)
LDL CALC: 71 mg/dL (ref 0–99)
TRIGLYCERIDES: 97 mg/dL (ref 0–149)
VLDL CHOLESTEROL CAL: 19 mg/dL (ref 5–40)

## 2017-12-05 LAB — TSH: TSH: 3.67 u[IU]/mL (ref 0.450–4.500)

## 2017-12-11 ENCOUNTER — Telehealth: Payer: Self-pay | Admitting: Family Medicine

## 2017-12-11 MED ORDER — PRAVASTATIN SODIUM 40 MG PO TABS: ORAL_TABLET | ORAL | 1 refills | Status: DC

## 2017-12-11 NOTE — Telephone Encounter (Signed)
Rx sent- patient aware.  

## 2018-05-28 ENCOUNTER — Other Ambulatory Visit: Payer: Self-pay | Admitting: Gynecology

## 2018-05-28 DIAGNOSIS — Z1231 Encounter for screening mammogram for malignant neoplasm of breast: Secondary | ICD-10-CM

## 2018-06-12 ENCOUNTER — Ambulatory Visit
Admission: RE | Admit: 2018-06-12 | Discharge: 2018-06-12 | Disposition: A | Discharge status: Home / Self Care | Payer: BC Managed Care – PPO | Source: Ambulatory Visit | Attending: Gynecology | Admitting: Gynecology

## 2018-06-12 DIAGNOSIS — Z1231 Encounter for screening mammogram for malignant neoplasm of breast: Secondary | ICD-10-CM

## 2018-09-25 ENCOUNTER — Other Ambulatory Visit: Payer: Self-pay | Admitting: Family Medicine

## 2018-09-25 MED ORDER — PRAVASTATIN SODIUM 40 MG PO TABS: ORAL_TABLET | ORAL | 0 refills | Status: DC

## 2018-09-25 NOTE — Telephone Encounter (Signed)
Refill sent.  Needs to be seen for any further refills

## 2018-10-19 ENCOUNTER — Other Ambulatory Visit: Payer: Self-pay | Admitting: Family Medicine

## 2018-10-31 ENCOUNTER — Telehealth: Payer: Self-pay | Admitting: Family Medicine

## 2018-10-31 NOTE — Telephone Encounter (Signed)
What is the name of the medication? Pravastatin  Have you contacted your pharmacy to request a refill? yes  Which pharmacy would you [facility]    Patient notified that their request is being sent to the clinical staff for review and that they should receive a call once it is complete. If they do not receive a call within 24 hours they can check with their pharmacy or our office.

## 2018-10-31 NOTE — Telephone Encounter (Signed)
[place]. NTBS 30 days given 09/25/18

## 2018-10-31 NOTE — Telephone Encounter (Signed)
Patient has a follow up appointment scheduled. 

## 2018-11-06 ENCOUNTER — Ambulatory Visit (INDEPENDENT_AMBULATORY_CARE_PROVIDER_SITE_OTHER): Payer: BC Managed Care – PPO | Admitting: Family Medicine

## 2018-11-06 ENCOUNTER — Other Ambulatory Visit: Payer: Self-pay

## 2018-11-06 DIAGNOSIS — Z130 Encounter for screening for diseases of the blood and blood-forming organs and certain disorders involving the immune mechanism: Secondary | ICD-10-CM

## 2018-11-06 DIAGNOSIS — M545 Low back pain, unspecified: Secondary | ICD-10-CM

## 2018-11-06 DIAGNOSIS — E782 Mixed hyperlipidemia: Secondary | ICD-10-CM | POA: Diagnosis not present

## 2018-11-06 DIAGNOSIS — Z114 Encounter for screening for human immunodeficiency virus [HIV]: Secondary | ICD-10-CM | POA: Diagnosis not present

## 2018-11-06 DIAGNOSIS — M8580 Other specified disorders of bone density and structure, unspecified site: Secondary | ICD-10-CM | POA: Diagnosis not present

## 2018-11-06 DIAGNOSIS — G8929 Other chronic pain: Secondary | ICD-10-CM

## 2018-11-06 MED ORDER — PRAVASTATIN SODIUM 40 MG PO TABS: ORAL_TABLET | ORAL | 1 refills | Status: DC

## 2018-11-06 NOTE — Patient Instructions (Signed)
In preparation for our upcoming appointment regarding weight loss, I would like you to answer the following questions on a separate piece of paper and bring them with you to your appointment.   Why do you want to lose weight?  Does obesity run in your family?  What has been your highest weight/ lowest weight in the past?  What did you weigh when you were 68 years old?  What is your goal weight?  What diets/ medications have you tried and did they work?  What side effects did they have if any?  What are your "eating/ binging" triggers?  Have you ever seen a therapist regarding your eating habits?  Are you willing to?  Would you be willing to see a dietician?  Are you willing to meet with me monthly?  Do you have any personal history of thyroid disease (including thyroid cancer), pancreatitis, diabetes, high blood pressure, high cholesterol, heart attack, stroke?  Do you have any family history of thyroid disease (including thyroid cancer), pancreatitis, diabetes, high blood pressure, high cholesterol, heart attack, stroke?  If so, please list who.  Have you ever been diagnosed or had a mental health disorder (addiction, depression, anxiety, bipolar disorder, schizophrenia, etc)?  Is there family history of mental health disorders (addiction, depression, anxiety, bipolar disorder, schizophrenia, etc)?  Would you be interested in bariatric surgery if you were determined to be a candidate?  Please also bring a 3 day food journal to that appointment.  Document EVERYTHING (including snacks/ candies/ food tastings/ drinks), what time of day you ate them and what you were doing and feeling when you ate/ drink (were you driving/ rushing to get somewhere?  Were you seated at the dinner table/ watching tv?  Were you lonely/ upset/ happy/ celebrating?)  We discussed weight loss options today including: dietician, seeing a weight loss clinic [facility] w/ [facility]), medication.  Some  medication options are: Phentermine, Qsymia, Victoza, Wellbutrin

## 2018-11-06 NOTE — Progress Notes (Signed)
Telephone visit  Subjective: CC: HLD PCP: [name], DO MMN:OTRRNH Rachael Nguyen is a 68 y.o. female calls for telephone consult today. Patient provides verbal consent for consult held via phone.  Location of patient: home Location of provider: [facility] Others present for call: none  1. Hyperlipidemia/elevated blood pressure in office without diagnosis of hypertension Patient reports compliance with Pravachol and has been on this for many years.  Denies any chest pain, shortness of breath, lower extremity headaches.  She continues to have difficulty with weight loss though notes that she is not really doing anything at home now and eating what she wants.  She would be interested in more information with regards to how to successfully lose weight.  No chest pain, shortness of breath.  Blood pressure noted to be 160 over 90s last visit in June.  She notes that every time she comes to the office her blood pressure is elevated but her blood pressures at home tend to be around 130s over 70s.  2.  Chronic low back pain Patient reports many year history of left-sided low back pain.  She notes that sometimes when back pain is bad she has a sensation of numbness over the left low back.  Denies any lower extremity weakness, numbness, tingling, falls.  No preceding injury.  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  She notes that symptoms are worse after she is seated for a while and gets up.  She reports tightness and catching.  No saddle anesthesia, fecal incontinence or urinary retention.  She uses Tylenol and BenGay which do help some.  The use of oral NSAIDs because they are "bad for her".  No previous work-up for low back pain.   ROS: Per HPI  Allergies  Allergen Reactions  . Vicodin [Hydrocodone-Acetaminophen] Nausea Only    dizziness  . Allegra-D  [Fexofenadine-Pseudoephed Er] Nausea Only  . Codeine Other (See Comments)    GI upset; dizziness   Past Medical History:  Diagnosis Date  . Hyperlipidemia   . Osteopenia 04/2015 T score -2.4   FRAX not calculated    Current Outpatient Medications:  .  Ascorbic Acid (VITAMIN C ADULT GUMMIES PO), Take by mouth. Takes 2 daily, Disp: , Rfl:  .  CALCIUM PO, Take by mouth 2 (two) times daily., Disp: , Rfl:  .  Cholecalciferol (VITAMIN D3) 1000 UNITS CAPS, Take by mouth., Disp: , Rfl:  .  pravastatin (PRAVACHOL) 40 MG tablet, TAKE 1 TABLET (40 MG TOTAL) BY MOUTH DAILY., Disp: 30 tablet, Rfl: 0  Assessment/ Plan: 68 y.o. female   1. Mixed hyperlipidemia Plan for fasting lipid panel.  Pravachol refilled - Lipid Panel; Future  2. Morbid obesity ([place]) I have given her information in anticipation to her next visit.  Check metabolic labs. - Lipid Panel; Future - CMP14+EGFR; Future - CBC; Future - TSH; Future - Bayer DCA Hb A1c Waived; Future  3. Osteopenia, unspecified location Will be due for repeat DEXA in October of this year.  Check vitamin D - VITAMIN D 25 Hydroxy (Vit-D Deficiency, Fractures); Future  4. Screening for HIV without presence of risk factors No previous screening.  No risk factors - HIV antibody (with reflex); Future  5. Screening, anemia, deficiency, iron - CBC; Future  6. Chronic left-sided low back pain without sciatica We discussed home physical therapy and I have  mailed this to her home.  Continue Tylenol and BenGay she finds this helpful.  We discussed consideration of addition of NSAIDs for severe back pain not relieved by Tylenol or BenGay.  We also discussed consideration for pretreatment prior to activities that exacerbate pain.  I offered referral but patient would like to hold off on this for now.  Discussed red flag symptoms and signs that would warrant emergent evaluation.   Start time: 9:00am End time: 9:14am  Total time spent on patient care  (including telephone call/ virtual visit): 10mn  [place]([phone]

## 2018-11-29 ENCOUNTER — Ambulatory Visit: Payer: BC Managed Care – PPO | Admitting: Family Medicine

## 2019-01-23 ENCOUNTER — Other Ambulatory Visit: Payer: Self-pay

## 2019-01-24 ENCOUNTER — Encounter: Payer: Self-pay | Admitting: Family Medicine

## 2019-01-24 ENCOUNTER — Ambulatory Visit: Payer: BC Managed Care – PPO | Admitting: Family Medicine

## 2019-01-24 VITALS — BP 142/88 | HR 74 | Temp 98.6°F | Ht 64.0 in | Wt 223.0 lb

## 2019-01-24 DIAGNOSIS — G8929 Other chronic pain: Secondary | ICD-10-CM

## 2019-01-24 DIAGNOSIS — M545 Low back pain, unspecified: Secondary | ICD-10-CM

## 2019-01-24 DIAGNOSIS — E782 Mixed hyperlipidemia: Secondary | ICD-10-CM | POA: Diagnosis not present

## 2019-01-24 DIAGNOSIS — M8580 Other specified disorders of bone density and structure, unspecified site: Secondary | ICD-10-CM

## 2019-01-24 DIAGNOSIS — Z130 Encounter for screening for diseases of the blood and blood-forming organs and certain disorders involving the immune mechanism: Secondary | ICD-10-CM

## 2019-01-24 LAB — BAYER DCA HB A1C WAIVED: HB A1C (BAYER DCA - WAIVED): 5.6 % (ref <7.0)

## 2019-01-24 NOTE — Progress Notes (Signed)
Subjective: CC: Follow-up cholesterol, obesity PCP: Rachael Norlander, DO OIZ:[id number] Rachael Nguyen is a 68 y.o. female presenting to clinic today for:  1.  Hyperlipidemia Patient reports compliance with Pravachol 40 mg daily.  She is actively working on weight loss and is walking about 2 miles per day.  She is interested in pursuing nonpharmacologic management of obesity at this time.  She previously lost about 50 pounds with weight watchers.  No chest pain, shortness of breath.  She brings a list of her home blood pressures to the office today.  Blood pressures running between 112-134/63-82.  2.  Low back pain Patient with chronic low back pain.  She notes that most times the pain seems to be predominantly after she has been sitting for a while.  After she gets up and moving symptoms do seem to improve.  She has been performing physical therapy exercises as I discussed with her last visit and she has been walking as above which do seem to help.  She does report a numb sensation along her mid to low back.  Denies any lower extremity weakness or sensation changes.  She occasionally uses oral NSAIDs to help.  She would like to proceed with x-rays of her back.   ROS: Per HPI  Allergies  Allergen Reactions  . Vicodin [Hydrocodone-Acetaminophen] Nausea Only    dizziness  . Allegra-D [Fexofenadine-Pseudoephed Er] Nausea Only  . Codeine Other (See Comments)    GI upset; dizziness   Past Medical History:  Diagnosis Date  . Hyperlipidemia   . Osteopenia 04/2015 T score -2.4   FRAX not calculated    Current Outpatient Medications:  .  Ascorbic Acid (VITAMIN C ADULT GUMMIES PO), Take by mouth. Takes 2 daily, Disp: , Rfl:  .  CALCIUM PO, Take by mouth 2 (two) times daily., Disp: , Rfl:  .  Cholecalciferol (VITAMIN D3) 1000 UNITS CAPS, Take by mouth., Disp: , Rfl:  .  pravastatin (PRAVACHOL) 40 MG tablet, TAKE 1 TABLET (40 MG TOTAL) BY MOUTH DAILY., Disp: 90 tablet, Rfl: 1 Social History    Socioeconomic History  . Marital status: Married    Spouse name: Not on file  . Number of children: Not on file  . Years of education: Not on file  . Highest education level: Not on file  Occupational History  . Not on file  Social Needs  . Financial resource strain: Not on file  . Food insecurity    Worry: Not on file    Inability: Not on file  . Transportation needs    Medical: Not on file    Non-medical: Not on file  Tobacco Use  . Smoking status: Never Smoker  . Smokeless tobacco: Never Used  Substance and Sexual Activity  . Alcohol use: No    Alcohol/week: 0.0 standard drinks  . Drug use: No  . Sexual activity: Yes  Lifestyle  . Physical activity    Days per week: Not on file    Minutes per session: Not on file  . Stress: Not on file  Relationships  . Social Herbalist on phone: Not on file    Gets together: Not on file    Attends religious service: Not on file    Active member of club or organization: Not on file    Attends meetings of clubs or organizations: Not on file    Relationship status: Not on file  . Intimate partner violence    Fear of current  or ex partner: Not on file    Emotionally abused: Not on file    Physically abused: Not on file    Forced sexual activity: Not on file  Other Topics Concern  . Not on file  Social History Narrative  . Not on file   Family History  Problem Relation Age of Onset  . Breast cancer Paternal Aunt   . Colon cancer Neg Hx   . Esophageal cancer Neg Hx   . Pancreatic cancer Neg Hx   . Rectal cancer Neg Hx   . Stomach cancer Neg Hx   . Ulcerative colitis Neg Hx   . Crohn's disease Neg Hx     Objective: Office vital signs reviewed. BP (!) 142/88   Pulse 74   Temp 98.6 F (37 C) (Oral)   Ht 5\' 4"  (1.626 m)   Wt 223 lb (101.2 kg)   BMI 38.28 kg/m   Physical Examination:  General: Awake, alert, well nourished, No acute distress HEENT: Normal, no carotid bruits, sclera white Cardio: regular  rate and rhythm, S1S2 heard, no murmurs appreciated Pulm: clear to auscultation bilaterally, no wheezes, rhonchi or rales; normal work of breathing on room air Extremities: warm, well perfused, No edema, cyanosis or clubbing; +2 pulses bilaterally MSK: normal gait and station  Lumbar spine: Has full active range of motion.  Assessment/ Plan: 68 y.o. female   1. Mixed hyperlipidemia Continue statin.  I renewed this for 1 year.  Check fasting lipid panel.  Future labs are in.   2. Morbid obesity ([place]) Working on weight loss.  Mediterranean diet recommended.  Handout provided and website given for nutritional information.  Handout for [employer] also provided.  3. Chronic left-sided low back pain without sciatica Likely degenerative in nature.  I think that continued weight loss and exercise will be of benefit to this patient.  She is demonstrating no red flag signs or symptoms at this point.  Will obtain plain films.  I did offer her referral to orthopedics which she declined this at this point. - DG Lumbar Spine 2-3 Views; Future   Orders Placed This Encounter  Procedures  . DG Lumbar Spine 2-3 Views    Standing Status:   Future    Standing Expiration Date:   03/25/2020    Order Specific Question:   Reason for Exam (SYMPTOM  OR DIAGNOSIS REQUIRED)    Answer:   back pain    Order Specific Question:   Preferred imaging location?    Answer:   Internal   No orders of the defined types were placed in this encounter.  Additionally BP elevated in office but this seems to be white coat HTN, as her BPs are well within normal range at home.  May consider ambulatory BP monitor next visit if persistently elevated.  Rachael Norlander, DO [place] [phone]

## 2019-01-24 NOTE — Patient Instructions (Addendum)
You had labs performed today.  You will be contacted with the results of the labs once they are available, usually in the next 3 business days for routine lab work.  If you have an active my chart account, they will be released to your MyChart.  If you prefer to have these labs released to you via telephone, please let us know.  If you had a pap smear or biopsy performed, expect to be contacted in about 7-10 days.  reinvestinglink.[email or web address] Mediterranean Diet A Mediterranean diet refers to food and lifestyle choices that are based on the traditions of countries located on the [employer]. This way of eating has been shown to help prevent certain conditions and improve outcomes for people who have chronic diseases, like kidney disease and heart disease. What are tips for following this plan? Lifestyle  Cook and eat meals together with your family, when possible.  Drink enough fluid to keep your urine clear or pale yellow.  Be physically active every day. This includes: ? Aerobic exercise like running or swimming. ? Leisure activities like gardening, walking, or housework.  Get 7-8 hours of sleep each night.  If recommended by your health care provider, drink red wine in moderation. This means 1 glass a day for nonpregnant women and 2 glasses a day for men. A glass of wine equals 5 oz (150 mL). Reading food labels   Check the serving size of packaged foods. For foods such as rice and pasta, the serving size refers to the amount of cooked product, not dry.  Check the total fat in packaged foods. Avoid foods that have saturated fat or trans fats.  Check the ingredients list for added sugars, such as corn syrup. Shopping  At the grocery store, buy most of your food from the areas near the walls of the store. This includes: ? Fresh fruits and vegetables (produce). ? Grains, beans, nuts,  and seeds. Some of these may be available in unpackaged forms or large amounts (in bulk). ? Fresh seafood. ? Poultry and eggs. ? Low-fat dairy products.  Buy whole ingredients instead of prepackaged foods.  Buy fresh fruits and vegetables in-season from local farmers markets.  Buy frozen fruits and vegetables in resealable bags.  If you do not have access to quality fresh seafood, buy precooked frozen shrimp or canned fish, such as tuna, salmon, or sardines.  Buy small amounts of raw or cooked vegetables, salads, or olives from the deli or salad bar at your store.  Stock your pantry so you always have certain foods on hand, such as olive oil, canned tuna, canned tomatoes, rice, pasta, and beans. Cooking  Cook foods with extra-virgin olive oil instead of using butter or other vegetable oils.  Have meat as a side dish, and have vegetables or grains as your main dish. This means having meat in small portions or adding small amounts of meat to foods like pasta or stew.  Use beans or vegetables instead of meat in common dishes like chili or lasagna.  Experiment with different cooking methods. Try roasting or broiling vegetables instead of steaming or sauteing them.  Add frozen vegetables to soups, stews, pasta, or rice.  Add nuts or seeds for added healthy fat at each meal. You can add these to yogurt, salads, or vegetable dishes.  Marinate fish or vegetables using olive oil, lemon juice, garlic, and fresh herbs. Meal planning   Plan to eat 1 vegetarian meal one day each week. Try to work up to  2 vegetarian meals, if possible.  Eat seafood 2 or more times a week.  Have healthy snacks readily available, such as: ? Vegetable sticks with hummus. ? [place] yogurt. ? Fruit and nut trail mix.  Eat balanced meals throughout the week. This includes: ? Fruit: 2-3 servings a day ? Vegetables: 4-5 servings a day ? Low-fat dairy: 2 servings a day ? Fish, poultry, or lean meat: 1 serving  a day ? Beans and legumes: 2 or more servings a week ? Nuts and seeds: 1-2 servings a day ? Whole grains: 6-8 servings a day ? Extra-virgin olive oil: 3-4 servings a day  Limit red meat and sweets to only a few servings a month What are my food choices?  Mediterranean diet ? Recommended  Grains: Whole-grain pasta. Brown rice. Bulgar wheat. Polenta. Couscous. Whole-wheat bread. [place].  Vegetables: Artichokes. Beets. Broccoli. Cabbage. Carrots. Eggplant. Green beans. Chard. Kale. Spinach. Onions. Leeks. Peas. Squash. Tomatoes. Peppers. Radishes.  Fruits: Apples. Apricots. Avocado. Berries. Bananas. Cherries. Dates. Figs. Grapes. Lemons. Melon. Oranges. Peaches. Plums. Pomegranate.  Meats and other protein foods: Beans. Almonds. Sunflower seeds. Pine nuts. Peanuts. [place]. Salmon. Scallops. Shrimp. [place]. Tilapia. Clams. Oysters. Eggs.  Dairy: Low-fat milk. Cheese. Greek yogurt.  Beverages: Water. Red wine. Herbal tea.  Fats and oils: Extra virgin olive oil. Avocado oil. Grape seed oil.  Sweets and desserts: [place] yogurt with honey. Baked apples. Poached pears. Trail mix.  Seasoning and other foods: Basil. Cilantro. Coriander. Cumin. Mint. Parsley. Sage. Rosemary. Tarragon. Garlic. Oregano. Thyme. Pepper. Balsalmic vinegar. Tahini. Hummus. Tomato sauce. Olives. Mushrooms. ? Limit these  Grains: Prepackaged pasta or rice dishes. Prepackaged cereal with added sugar.  Vegetables: Deep fried potatoes (french fries).  Fruits: Fruit canned in syrup.  Meats and other protein foods: Beef. Pork. Lamb. Poultry with skin. Hot dogs. Bernie[place] Salines.  Dairy: Ice cream. Sour cream. Whole milk.  Beverages: Juice. Sugar-sweetened soft drinks. Beer. Liquor and spirits.  Fats and oils: Butter. Canola oil. Vegetable oil. Beef fat (tallow). Lard.  Sweets and desserts: Cookies. Cakes. Pies. Candy.  Seasoning and other foods: Mayonnaise. Premade sauces and marinades. The items listed may not be a  complete list. Talk with your dietitian about what dietary choices are right for you. Summary  The Mediterranean diet includes both food and lifestyle choices.  Eat a variety of fresh fruits and vegetables, beans, nuts, seeds, and whole grains.  Limit the amount of red meat and sweets that you eat.  Talk with your health care provider about whether it is safe for you to drink red wine in moderation. This means 1 glass a day for nonpregnant women and 2 glasses a day for men. A glass of wine equals 5 oz (150 mL). This information is not intended to replace advice given to you by your health care provider. Make sure you discuss any questions you have with your health care provider. Document Released: 02/11/2016 Document Revised: 02/18/2016 Document Reviewed: 02/11/2016 Elsevier Patient Education  2020 [employer].

## 2019-01-25 LAB — CBC
Hematocrit: 39.4 % (ref 34.0–46.6)
Hemoglobin: 13.3 g/dL (ref 11.1–15.9)
MCH: 29.2 pg (ref 26.6–33.0)
MCHC: 33.8 g/dL (ref 31.5–35.7)
MCV: 86 fL (ref 79–97)
Platelets: 237 10*3/uL (ref 150–450)
RBC: 4.56 x10E6/uL (ref 3.77–5.28)
RDW: 14.1 % (ref 11.7–15.4)
WBC: 5.2 10*3/uL (ref 3.4–10.8)

## 2019-01-25 LAB — CMP14+EGFR
ALT: 13 IU/L (ref 0–32)
AST: 13 IU/L (ref 0–40)
Albumin/Globulin Ratio: 2.2 (ref 1.2–2.2)
Albumin: 4.3 g/dL (ref 3.8–4.8)
Alkaline Phosphatase: 69 IU/L (ref 39–117)
BUN/Creatinine Ratio: 25 (ref 12–28)
BUN: 17 mg/dL (ref 8–27)
Bilirubin Total: 0.6 mg/dL (ref 0.0–1.2)
CO2: 23 mmol/L (ref 20–29)
Calcium: 9.5 mg/dL (ref 8.7–10.3)
Chloride: 106 mmol/L (ref 96–106)
Creatinine, Ser: 0.69 mg/dL (ref 0.57–1.00)
GFR calc Af Amer: 106 mL/min/{1.73_m2} (ref >59)
GFR calc non Af Amer: 92 mL/min/{1.73_m2} (ref >59)
Globulin, Total: 2 g/dL (ref 1.5–4.5)
Glucose: 86 mg/dL (ref 65–99)
Potassium: 4.1 mmol/L (ref 3.5–5.2)
Sodium: 144 mmol/L (ref 134–144)
Total Protein: 6.3 g/dL (ref 6.0–8.5)

## 2019-01-25 LAB — LIPID PANEL
Chol/HDL Ratio: 2.3 ratio (ref 0.0–4.4)
Cholesterol, Total: 127 mg/dL (ref 100–199)
HDL: 56 mg/dL (ref >39)
LDL Calculated: 61 mg/dL (ref 0–99)
Triglycerides: 48 mg/dL (ref 0–149)
VLDL Cholesterol Cal: 10 mg/dL (ref 5–40)

## 2019-01-25 LAB — TSH: TSH: 2.74 u[IU]/mL (ref 0.450–4.500)

## 2019-01-25 LAB — VITAMIN D 25 HYDROXY (VIT D DEFICIENCY, FRACTURES): Vit D, 25-Hydroxy: 53.7 ng/mL (ref 30.0–100.0)

## 2019-02-04 ENCOUNTER — Telehealth: Payer: Self-pay | Admitting: Family Medicine

## 2019-02-05 NOTE — Telephone Encounter (Signed)
LABS ARE MAIL TO PT

## 2019-02-12 ENCOUNTER — Telehealth: Payer: Self-pay | Admitting: Family Medicine

## 2019-02-12 NOTE — Telephone Encounter (Signed)
Patient calls report that she is taking retirement starting September 1.  She has several sick days that she can use prior to that time and she is asking that I write a note to cover her for work stating that she is unable to return to work secondary to health risks and COVID-19.  She feels that she is at increased risk for complications of [id number] given age.  No underlying pulmonary disease but she does have hyperlipidemia.  She is also morbidly obese.  Age and obesity have certainly shown to have increased rates of complication and therefore I have sent in a letter attention [place] fax [phone].

## 2019-02-12 NOTE — Telephone Encounter (Signed)
Letter faxed and printed

## 2019-02-12 NOTE — Telephone Encounter (Signed)
For some reason. Letter will not print on side A.  Please try printing and faxing to requested number.  Patient wants to also pick up a copy.  Please place additional copy in the front.  She will come after lunch.

## 2019-02-12 NOTE — Telephone Encounter (Signed)
Patient needs a note stating that she is not returning to work due to [place] because of health reasons.

## 2019-02-13 ENCOUNTER — Telehealth: Payer: Self-pay | Admitting: Family Medicine

## 2019-02-13 NOTE — Telephone Encounter (Signed)
Letter faxed again with provider's signature.

## 2019-02-21 ENCOUNTER — Ambulatory Visit: Payer: BC Managed Care – PPO | Admitting: Family Medicine

## 2019-02-21 ENCOUNTER — Ambulatory Visit (INDEPENDENT_AMBULATORY_CARE_PROVIDER_SITE_OTHER): Payer: BC Managed Care – PPO

## 2019-02-21 ENCOUNTER — Other Ambulatory Visit: Payer: Self-pay

## 2019-02-21 ENCOUNTER — Encounter: Payer: Self-pay | Admitting: Family Medicine

## 2019-02-21 VITALS — BP 138/80 | HR 67 | Temp 98.2°F | Ht 64.0 in | Wt 215.0 lb

## 2019-02-21 DIAGNOSIS — M545 Low back pain, unspecified: Secondary | ICD-10-CM

## 2019-02-21 DIAGNOSIS — G8929 Other chronic pain: Secondary | ICD-10-CM | POA: Diagnosis not present

## 2019-02-21 MED ORDER — MELOXICAM 15 MG PO TABS: 7.5000 mg | ORAL_TABLET | Freq: Every day | ORAL | 1 refills | Status: DC

## 2019-02-21 NOTE — Progress Notes (Signed)
Subjective: CC: low back pain PCP: [name], DO WSF:KCLEXN Rachael Nguyen is a 68 y.o. female presenting to clinic today for:  1. Low back pain Patient with ongoing low back pain that seems to be worse with certain activities like ambulation.  She notes this is predominantly in the left side of the low back and radiates down to the top of her left buttock.  Denies any associated weakness, sensation changes.  No preceding injury but she has had multiple stumbles and falls over the years.  She uses ibuprofen which does seem to help.  At its worst, pain is a 5/10 and a 0/10 at rest.  She has continued to stay ambulatory and walks 8 laps per day.  She is working on weight loss, with a successful 20 pound weight loss.   ROS: Per HPI  Allergies  Allergen Reactions  . Vicodin [Hydrocodone-Acetaminophen] Nausea Only    dizziness  . Allegra-D [Fexofenadine-Pseudoephed Er] Nausea Only  . Codeine Other (See Comments)    GI upset; dizziness   Past Medical History:  Diagnosis Date  . Hyperlipidemia   . Osteopenia 04/2015 T score -2.4   FRAX not calculated    Current Outpatient Medications:  .  Ascorbic Acid (VITAMIN C ADULT GUMMIES PO), Take by mouth. Takes 2 daily, Disp: , Rfl:  .  CALCIUM PO, Take by mouth 2 (two) times daily., Disp: , Rfl:  .  Cholecalciferol (VITAMIN D3) 1000 UNITS CAPS, Take by mouth., Disp: , Rfl:  .  famotidine (PEPCID) 40 MG tablet, Take 40 mg by mouth daily., Disp: , Rfl:  .  pravastatin (PRAVACHOL) 40 MG tablet, TAKE 1 TABLET (40 MG TOTAL) BY MOUTH DAILY., Disp: 90 tablet, Rfl: 1 Social History   Socioeconomic History  . Marital status: Married    Spouse name: Not on file  . Number of children: Not on file  . Years of education: Not on file  . Highest education level: Not on file  Occupational History  . Not on file  Social Needs  . Financial resource strain: Not on file  . Food insecurity    Worry: Not on file    Inability: Not on file  .  Transportation needs    Medical: Not on file    Non-medical: Not on file  Tobacco Use  . Smoking status: Never Smoker  . Smokeless tobacco: Never Used  Substance and Sexual Activity  . Alcohol use: No    Alcohol/week: 0.0 standard drinks  . Drug use: No  . Sexual activity: Yes  Lifestyle  . Physical activity    Days per week: Not on file    Minutes per session: Not on file  . Stress: Not on file  Relationships  . Social Herbalist on phone: Not on file    Gets together: Not on file    Attends religious service: Not on file    Active member of club or organization: Not on file    Attends meetings of clubs or organizations: Not on file    Relationship status: Not on file  . Intimate partner violence    Fear of current or ex partner: Not on file    Emotionally abused: Not on file    Physically abused: Not on file    Forced sexual activity: Not on file  Other Topics Concern  . Not on file  Social History Narrative  . Not on file   Family History  Problem Relation Age  of Onset  . Breast cancer Paternal Aunt   . Colon cancer Neg Hx   . Esophageal cancer Neg Hx   . Pancreatic cancer Neg Hx   . Rectal cancer Neg Hx   . Stomach cancer Neg Hx   . Ulcerative colitis Neg Hx   . Crohn's disease Neg Hx     Objective: Office vital signs reviewed. BP 138/80   Pulse 67   Temp 98.2 F (36.8 C) (Oral)   Ht 5\' 4"  (1.626 m)   Wt 215 lb (97.5 kg)   BMI 36.90 kg/m   Physical Examination:  General: Awake, alert, obese, No acute distress MSK: antlagic gait and station  Lumbar spine: Has full active range of motion.  No midline tenderness palpation.  No paraspinal muscle tenderness palpation.  She is a prominent lumbosacral curve but otherwise no palpable abnormalities.  Negative straight leg raise.  Hips: Full active range of motion.  Negative FADIR/FABER. Neuro: LE 5/5 Strength and light touch sensation grossly intact, patellar DTRs 2/4  No results found.   Assessment/ Plan: 68 y.o. female   1. Chronic left-sided low back pain without sciatica Personal review of x-ray demonstrated a slight retrolisthesis of L5 but no significant abnormalities otherwise.  I am waiting for formal review by radiology.  We will start meloxicam.  Instructions for use discussed.  Avoid other NSAIDs.  Referral to physical therapy placed.  If worsening or ongoing symptoms despite treatment, may need to consider obtaining MRI versus referral to orthopedics for further evaluation and management.  She understands red flag signs and symptoms and reasons for emergent evaluation emergency department. - DG Lumbar Spine 2-3 Views; Future - meloxicam (MOBIC) 15 MG tablet; Take 0.5-1 tablets (7.5-15 mg total) by mouth daily. (if needed for back pain)  Dispense: 30 tablet; Refill: 1 - Ambulatory referral to Physical Therapy   Orders Placed This Encounter  Procedures  . DG Lumbar Spine 2-3 Views    Standing Status:   Future    Number of Occurrences:   1    Standing Expiration Date:   04/22/2020    Order Specific Question:   Reason for Exam (SYMPTOM  OR DIAGNOSIS REQUIRED)    Answer:   back pain    Order Specific Question:   Preferred imaging location?    Answer:   Internal   No orders of the defined types were placed in this encounter.    [name], DO [place] [phone]

## 2019-02-21 NOTE — Patient Instructions (Signed)
I will contact you with formal report from radiology for your xray.  Physical therapy ordered next door.  I have ordered Mobic.  Take 1/2-1 tablet DAILY as needed for back pain.  If symptoms do not improve as expected, or if they get worse, I will refer you to the back specialist.  You have prescribed a nonsteroidal anti-inflammatory drug (NSAID) today. This will help with your pain and inflammation. Please do not take any other NSAIDs (ibuprofen/Motrin/Advil, naproxen/Aleve, meloxicam/Mobic, Voltaren/diclofenac). Please make sure to eat a meal when taking this medication.   Caution:  If you have a history of acid reflux/indigestion, I recommend that you take an antacid (such as Prilosec, Prevacid) daily while on the NSAID.  If you have a history of bleeding disorder, gastric ulcer, are on a blood thinner (like warfarin/Coumadin, Xarelto, Eliquis, etc) please do not take NSAID.  If you have ever had a heart attack, you should not take NSAIDs.

## 2019-02-25 ENCOUNTER — Other Ambulatory Visit: Payer: Self-pay

## 2019-02-25 ENCOUNTER — Ambulatory Visit: Payer: BC Managed Care – PPO | Attending: Family Medicine | Admitting: Physical Therapy

## 2019-02-25 ENCOUNTER — Encounter: Payer: Self-pay | Admitting: Physical Therapy

## 2019-02-25 ENCOUNTER — Telehealth: Payer: Self-pay | Admitting: Family Medicine

## 2019-02-25 ENCOUNTER — Other Ambulatory Visit: Payer: Self-pay | Admitting: Family Medicine

## 2019-02-25 DIAGNOSIS — M545 Low back pain, unspecified: Secondary | ICD-10-CM

## 2019-02-25 MED ORDER — IBUPROFEN 600 MG PO TABS: 600.0000 mg | ORAL_TABLET | Freq: Three times a day (TID) | ORAL | 1 refills | Status: DC | PRN

## 2019-02-25 NOTE — Therapy (Signed)
[facility] [place], [place], [address] Phone: [phone]   Fax:  [phone]  Physical Therapy Evaluation  Patient Details  Name: Rachael Nguyen MRN: [id number] Date of Birth: 1950-09-12 Referring Provider (PT): Ronnie Doss DO.   Encounter Date: 02/25/2019  PT End of Session - 02/25/19 1349    Visit Number  1    Number of Visits  12    Date for PT Re-Evaluation  04/08/19    PT Start Time  1030    PT Stop Time  1119    PT Time Calculation (min)  49 min    Activity Tolerance  Patient tolerated treatment well    Behavior During Therapy  [facility] for tasks assessed/performed       Past Medical History:  Diagnosis Date  . Hyperlipidemia   . Osteopenia 04/2015 T score -2.4   FRAX not calculated    Past Surgical History:  Procedure Laterality Date  . COMBINED HYSTEROSCOPY DIAGNOSTIC / D&C  2011   polyp  . DENTAL SURGERY     3 teeth extracted    There were no vitals filed for this visit.   Subjective Assessment - 02/25/19 1259    Subjective  COVID-19 screen performed prior to patient entering clinic.  She states she began to experience left sided low back pain on 02/19/19 for no apparent reason other than perhaps related to starting a walking program. her pain is rated at an 8/10 today that has increased with walking.  She has found nothing really helps decrease her pain thus far.    Pertinent History  Osteopenia.    Patient Stated Goals  Work in yard and get back to walking program.    Currently in Pain?  Yes    Pain Score  8     Pain Location  Back    Pain Orientation  Left    Pain Descriptors / Indicators  Aching;Throbbing    Pain Type  Acute pain    Pain Onset  In the past 7 days    Pain Frequency  Constant    Aggravating Factors   See above.    Pain Relieving Factors  See above.         [facility] PT Assessment - 02/25/19 0001      Assessment   Medical Diagnosis  Chronic left-sided low back pain  without sciatica.    Referring Provider (PT)  Ronnie Doss DO.    Onset Date/Surgical Date  --   02/19/19.     Precautions   Precautions  --   Osteopenia.     Restrictions   Weight Bearing Restrictions  No      Balance Screen   Has the patient fallen in the past 6 months  Yes    How many times?  --   Many...wears flip-flops.     Home Environment   Living Environment  Private residence      Prior Function   Level of Independence  Independent      Posture/Postural Control   Posture Comments  Per X-ray..lumbar scoliosis.      Deep Tendon Reflexes   DTR Assessment Site  Patella;Achilles    Patella DTR  2+    Achilles DTR  1+      ROM / Strength   AROM / PROM / Strength  AROM;Strength      AROM   Overall AROM Comments  Essentially full lumabr active flexion (fingertips to floor) and extension (20 degrees).  Strength   Overall Strength Comments  Normal LE strength.      Palpation   Palpation comment  Tender to palption over left SIJ and upper gluteal region.      Special Tests   Other special tests  (=) leg lengths, (-) SLR, (-) FABER and (-) sacral Press test.      Ambulation/Gait   Gait Comments  Very antalgic gait pattern with a notable limp and decreased stance time over her left LE.                Objective measurements completed on examination: See above findings.      [facility] Adult PT Treatment/Exercise - 02/25/19 0001      Modalities   Modalities  Electrical Stimulation;Moist Heat      Moist Heat Therapy   Number Minutes Moist Heat  20 Minutes    Moist Heat Location  Lumbar Spine      Electrical Stimulation   Electrical Stimulation Location  Left SIJ region.    Electrical Stimulation Action  IFC    Electrical Stimulation Parameters  80-150 Hz x 20 minutes.    Electrical Stimulation Goals  Pain                  PT Long Term Goals - 02/25/19 1429      PT LONG TERM GOAL #1   Title  Independent with a HEP.    Time  6     Period  Weeks    Status  New      PT LONG TERM GOAL #2   Title  Perform ADL's with pain not > 2-3/10.    Time  6    Period  Weeks    Status  New      PT LONG TERM GOAL #3   Title  Return to walking for exercise.    Time  6    Period  Weeks    Status  New             Plan - 02/25/19 1350    Clinical Impression Statement  The patient presents to OPPT with c/o left sided low back pain that may be due to walking for exercise.  She was tender to palption over her left SIJ region and upper gluteal area.  Special testing was negative.  Her gait was extremely antalgic today.  She is able to walk for exercise currently due to high pain-levels.  Patient will benefit from skilled physical therapy intervention to address deficits and pain.    Personal Factors and Comorbidities  Comorbidity 1    Comorbidities  Osteopenia.    Examination-Activity Limitations  Locomotion Level    Examination-Participation Restrictions  Other    Stability/Clinical Decision Making  Evolving/Moderate complexity    Clinical Decision Making  Low    Rehab Potential  Excellent    PT Frequency  2x / week    PT Duration  6 weeks    PT Treatment/Interventions  ADLs/Self Care Home Management;Cryotherapy;Electrical Stimulation;Ultrasound;Moist Heat;Therapeutic activities;Therapeutic exercise;Manual techniques;Patient/family education    PT Next Visit Plan  Combo e'stim/U/S and STW/M to left SIJ/upper gluteal region.  Progress with core exercises.    Consulted and Agree with Plan of Care  Patient       Patient will benefit from skilled therapeutic intervention in order to improve the following deficits and impairments:  Abnormal gait, Decreased activity tolerance, Pain  Visit Diagnosis: Acute left-sided low back pain without sciatica - Plan: PT plan  of care cert/re-cert     Problem List Patient Active Problem List   Diagnosis Date Noted  . Chronic left-sided low back pain without sciatica 11/06/2018  .  Morbid obesity ([place]) 02/19/2016  . Diverticulosis of colon 12/01/2015  . Intertrigo 12/01/2015  . GERD (gastroesophageal reflux disease) 06/04/2015  . Osteopenia 06/04/2015  . Hyperlipidemia 03/28/2013    Irma Delancey, [place] MPT 02/25/2019, 2:34 PM  [facility] [address] [place], [place], [address] Phone: [phone]   Fax:  [phone]  Name: Rachael Nguyen MRN: [id number] Date of Birth: [birth year]

## 2019-02-25 NOTE — Telephone Encounter (Signed)
Patient states that ibuprofen did help more.

## 2019-02-25 NOTE — Telephone Encounter (Signed)
If she felt the ibuprofen was more helpful, I can send the rx version of that in.  Please ask patient.  Otherwise, plan for prednisone dosepak.

## 2019-02-28 ENCOUNTER — Ambulatory Visit: Payer: BC Managed Care – PPO | Admitting: Physical Therapy

## 2019-03-25 ENCOUNTER — Other Ambulatory Visit: Payer: Self-pay | Admitting: Family Medicine

## 2019-03-25 ENCOUNTER — Telehealth: Payer: Self-pay | Admitting: Family Medicine

## 2019-03-25 MED ORDER — IBUPROFEN 600 MG PO TABS: 600.0000 mg | ORAL_TABLET | Freq: Three times a day (TID) | ORAL | 1 refills | Status: DC | PRN

## 2019-03-25 NOTE — Telephone Encounter (Signed)
I sent in the rx with additional refill last month.  Did she use it already?  Does she want to see ortho?

## 2019-03-25 NOTE — Telephone Encounter (Signed)
Patient states that she did use all of medication last month and has made an appt with ortho to be evaluated further

## 2019-03-25 NOTE — Telephone Encounter (Signed)
Thanks for that information.  Rx already sent.

## 2019-03-27 DIAGNOSIS — M5136 Other intervertebral disc degeneration, lumbar region: Secondary | ICD-10-CM | POA: Insufficient documentation

## 2019-03-27 DIAGNOSIS — M4310 Spondylolisthesis, site unspecified: Secondary | ICD-10-CM | POA: Insufficient documentation

## 2019-03-27 DIAGNOSIS — M4150 Other secondary scoliosis, site unspecified: Secondary | ICD-10-CM | POA: Insufficient documentation

## 2019-03-27 DIAGNOSIS — M4180 Other forms of scoliosis, site unspecified: Secondary | ICD-10-CM | POA: Insufficient documentation

## 2019-04-10 ENCOUNTER — Encounter: Payer: Self-pay | Admitting: Gynecology

## 2019-04-11 DIAGNOSIS — M62830 Muscle spasm of back: Secondary | ICD-10-CM | POA: Diagnosis not present

## 2019-04-11 DIAGNOSIS — M9904 Segmental and somatic dysfunction of sacral region: Secondary | ICD-10-CM | POA: Diagnosis not present

## 2019-04-11 DIAGNOSIS — M9903 Segmental and somatic dysfunction of lumbar region: Secondary | ICD-10-CM | POA: Diagnosis not present

## 2019-04-11 DIAGNOSIS — M9905 Segmental and somatic dysfunction of pelvic region: Secondary | ICD-10-CM | POA: Diagnosis not present

## 2019-04-15 DIAGNOSIS — M9903 Segmental and somatic dysfunction of lumbar region: Secondary | ICD-10-CM | POA: Diagnosis not present

## 2019-04-15 DIAGNOSIS — M62830 Muscle spasm of back: Secondary | ICD-10-CM | POA: Diagnosis not present

## 2019-04-15 DIAGNOSIS — M9904 Segmental and somatic dysfunction of sacral region: Secondary | ICD-10-CM | POA: Diagnosis not present

## 2019-04-15 DIAGNOSIS — M9905 Segmental and somatic dysfunction of pelvic region: Secondary | ICD-10-CM | POA: Diagnosis not present

## 2019-04-18 DIAGNOSIS — M9903 Segmental and somatic dysfunction of lumbar region: Secondary | ICD-10-CM | POA: Diagnosis not present

## 2019-04-18 DIAGNOSIS — M62830 Muscle spasm of back: Secondary | ICD-10-CM | POA: Diagnosis not present

## 2019-04-18 DIAGNOSIS — M9905 Segmental and somatic dysfunction of pelvic region: Secondary | ICD-10-CM | POA: Diagnosis not present

## 2019-04-18 DIAGNOSIS — M9904 Segmental and somatic dysfunction of sacral region: Secondary | ICD-10-CM | POA: Diagnosis not present

## 2019-04-24 ENCOUNTER — Telehealth: Payer: Self-pay | Admitting: *Deleted

## 2019-04-24 DIAGNOSIS — M9903 Segmental and somatic dysfunction of lumbar region: Secondary | ICD-10-CM | POA: Diagnosis not present

## 2019-04-24 DIAGNOSIS — M9904 Segmental and somatic dysfunction of sacral region: Secondary | ICD-10-CM | POA: Diagnosis not present

## 2019-04-24 DIAGNOSIS — M62830 Muscle spasm of back: Secondary | ICD-10-CM | POA: Diagnosis not present

## 2019-04-24 DIAGNOSIS — M9905 Segmental and somatic dysfunction of pelvic region: Secondary | ICD-10-CM | POA: Diagnosis not present

## 2019-04-24 MED ORDER — PRAVASTATIN SODIUM 40 MG PO TABS: ORAL_TABLET | ORAL | 2 refills | Status: DC

## 2019-04-24 NOTE — Addendum Note (Signed)
Addended by: Antonietta Barcelona D on: 04/24/2019 12:22 PM   Modules accepted: Orders

## 2019-04-24 NOTE — Telephone Encounter (Signed)
VM rcvd RF pravastatin from July visit for a year Refill sent to [facility]

## 2019-05-08 DIAGNOSIS — M9903 Segmental and somatic dysfunction of lumbar region: Secondary | ICD-10-CM | POA: Diagnosis not present

## 2019-05-08 DIAGNOSIS — M9905 Segmental and somatic dysfunction of pelvic region: Secondary | ICD-10-CM | POA: Diagnosis not present

## 2019-05-08 DIAGNOSIS — M9904 Segmental and somatic dysfunction of sacral region: Secondary | ICD-10-CM | POA: Diagnosis not present

## 2019-05-08 DIAGNOSIS — M62830 Muscle spasm of back: Secondary | ICD-10-CM | POA: Diagnosis not present

## 2019-05-20 ENCOUNTER — Other Ambulatory Visit: Payer: Self-pay | Admitting: Family Medicine

## 2019-05-20 DIAGNOSIS — Z1231 Encounter for screening mammogram for malignant neoplasm of breast: Secondary | ICD-10-CM

## 2019-07-16 ENCOUNTER — Ambulatory Visit: Payer: BC Managed Care – PPO

## 2019-07-29 ENCOUNTER — Other Ambulatory Visit: Payer: Self-pay

## 2019-07-29 DIAGNOSIS — G8929 Other chronic pain: Secondary | ICD-10-CM

## 2019-07-29 DIAGNOSIS — M545 Low back pain, unspecified: Secondary | ICD-10-CM

## 2019-08-08 ENCOUNTER — Ambulatory Visit: Payer: Self-pay | Attending: Internal Medicine

## 2019-08-08 DIAGNOSIS — Z23 Encounter for immunization: Secondary | ICD-10-CM

## 2019-08-08 NOTE — Progress Notes (Signed)
   Covid-19 Vaccination Clinic  Name:  Rachael Nguyen    MRN: [id number] DOB: [birth year]  08/08/2019  Rachael Nguyen was observed post Covid-19 immunization for 15 minutes without incidence. She was provided with Vaccine Information Sheet and instruction to access the V-Safe system.   Rachael Nguyen was instructed to call 911 with any severe reactions post vaccine: Marland Kitchen Difficulty breathing  . Swelling of your face and throat  . A fast heartbeat  . A bad rash all over your body  . Dizziness and weakness    Immunizations Administered    Name Date Dose VIS Date Route   Moderna COVID-19 Vaccine 08/08/2019  1:00 PM 0.5 mL 06/04/2019 Intramuscular   Manufacturer: Moderna   Lot: [id number]   [id number]

## 2019-09-09 ENCOUNTER — Ambulatory Visit: Payer: Self-pay | Attending: Internal Medicine

## 2019-09-09 DIAGNOSIS — Z23 Encounter for immunization: Secondary | ICD-10-CM | POA: Insufficient documentation

## 2019-09-09 NOTE — Progress Notes (Signed)
   Covid-19 Vaccination Clinic  Name:  Paulette Gaita    MRN: [id number] DOB: Jun 01, 1951  09/09/2019  Ms. Karpowich was observed post Covid-19 immunization for 15 minutes without incident. She was provided with Vaccine Information Sheet and instruction to access the V-Safe system.   Ms. Quito was instructed to call 911 with any severe reactions post vaccine: Marland Kitchen Difficulty breathing  . Swelling of face and throat  . A fast heartbeat  . A bad rash all over body  . Dizziness and weakness   Immunizations Administered    Name Date Dose VIS Date Route   Moderna COVID-19 Vaccine 09/09/2019 12:50 PM 0.5 mL 06/04/2019 Intramuscular   Manufacturer: Moderna   Lot: [id number]   [id number]

## 2019-10-01 ENCOUNTER — Other Ambulatory Visit: Payer: Self-pay

## 2019-10-01 ENCOUNTER — Telehealth: Payer: Self-pay | Admitting: Family Medicine

## 2019-10-01 ENCOUNTER — Ambulatory Visit (INDEPENDENT_AMBULATORY_CARE_PROVIDER_SITE_OTHER): Payer: Medicare HMO | Admitting: Family Medicine

## 2019-10-01 ENCOUNTER — Encounter: Payer: Self-pay | Admitting: Family Medicine

## 2019-10-01 VITALS — BP 155/105 | HR 71 | Temp 97.9°F | Ht 64.0 in | Wt 221.0 lb

## 2019-10-01 DIAGNOSIS — M25562 Pain in left knee: Secondary | ICD-10-CM

## 2019-10-01 DIAGNOSIS — R030 Elevated blood-pressure reading, without diagnosis of hypertension: Secondary | ICD-10-CM

## 2019-10-01 NOTE — Progress Notes (Signed)
Subjective: CC: Left knee tightness PCP: Rachael Norlander, DO [id number] Rachael Nguyen is a 69 y.o. female presenting to clinic today for:  1.  Left knee tightness Patient reports couple week history of left-sided posterior tightness.  She denies overt pain but notes that it seemed to swell intermittently.  Most recently she was gardening and noticed some swelling after that.  She is worried about a potential blood clot.  She used some Aspercreme which did seem to alleviate symptoms.  No preceding injury.  She has had back issues and is trying to slowly get back into physical exercise.  She admits to some weight gain.  No redness, swelling, warmth of the lower extremities.  No pain with ambulation.   ROS: Per HPI  Allergies  Allergen Reactions  . Vicodin [Hydrocodone-Acetaminophen] Nausea Only    dizziness  . Allegra-D [Fexofenadine-Pseudoephed Er] Nausea Only  . Codeine Other (See Comments)    GI upset; dizziness   Past Medical History:  Diagnosis Date  . Hyperlipidemia   . Osteopenia 04/2015 T score -2.4   FRAX not calculated    Current Outpatient Medications:  .  Ascorbic Acid (VITAMIN C ADULT GUMMIES PO), Take by mouth. Takes 2 daily, Disp: , Rfl:  .  aspirin EC 81 MG tablet, Take 81 mg by mouth daily., Disp: , Rfl:  .  CALCIUM PO, Take by mouth 2 (two) times daily., Disp: , Rfl:  .  Cholecalciferol (VITAMIN D3) 1000 UNITS CAPS, Take by mouth., Disp: , Rfl:  .  famotidine (PEPCID) 40 MG tablet, Take 40 mg by mouth daily., Disp: , Rfl:  .  ibuprofen (ADVIL) 600 MG tablet, Take 1 tablet (600 mg total) by mouth every 8 (eight) hours as needed for moderate pain., Disp: 30 tablet, Rfl: 1 .  pravastatin (PRAVACHOL) 40 MG tablet, TAKE 1 TABLET (40 MG TOTAL) BY MOUTH DAILY., Disp: 90 tablet, Rfl: 2 Social History   Socioeconomic History  . Marital status: Married    Spouse name: Not on file  . Number of children: Not on file  . Years of education: Not on file  . Highest  education level: Not on file  Occupational History  . Not on file  Tobacco Use  . Smoking status: Never Smoker  . Smokeless tobacco: Never Used  Substance and Sexual Activity  . Alcohol use: No    Alcohol/week: 0.0 standard drinks  . Drug use: No  . Sexual activity: Yes  Other Topics Concern  . Not on file  Social History Narrative  . Not on file   Social Determinants of Health   Financial Resource Strain:   . Difficulty of Paying Living Expenses:   Food Insecurity:   . Worried About Charity fundraiser in the Last Year:   . Arboriculturist in the Last Year:   Transportation Needs:   . Film/video editor (Medical):   Marland Kitchen Lack of Transportation (Non-Medical):   Physical Activity:   . Days of Exercise per Week:   . Minutes of Exercise per Session:   Stress:   . Feeling of Stress :   Social Connections:   . Frequency of Communication with Friends and Family:   . Frequency of Social Gatherings with Friends and Family:   . Attends Religious Services:   . Active Member of Clubs or Organizations:   . Attends Archivist Meetings:   Marland Kitchen Marital Status:   Intimate Partner Violence:   . Fear of Current or  Ex-Partner:   . Emotionally Abused:   Marland Kitchen Physically Abused:   . Sexually Abused:    Family History  Problem Relation Age of Onset  . Breast cancer Paternal Aunt   . Colon cancer Neg Hx   . Esophageal cancer Neg Hx   . Pancreatic cancer Neg Hx   . Rectal cancer Neg Hx   . Stomach cancer Neg Hx   . Ulcerative colitis Neg Hx   . Crohn's disease Neg Hx     Objective: Office vital signs reviewed. BP (!) 155/105   Pulse 71   Temp 97.9 F (36.6 C) (Temporal)   Ht 5\' 4"  (1.626 m)   Wt 221 lb (100.2 kg)   SpO2 97%   BMI 37.93 kg/m   Physical Examination:  General: Awake, alert,well nourished, No acute distress Extremities: warm, well perfused, No edema, cyanosis or clubbing; +2 pulses bilaterally; negative Homans' sign.  Calves are equal in girth.  No  redness.  No warmth. MSK: normal gait and normal station  Left knee: No gross soft tissue swelling, redness, warmth.  No tenderness palpation to patella, patellar tendon, quad tendon, joint line.  No palpable popliteal masses.  No ligamentous laxity. Skin: dry; intact; no rashes or lesions; varicose veins noted  Assessment/ Plan: 69 y.o. female   1. Acute pain of left knee I suspect that she probably has a small Baker's cyst.  Her physical exam was unremarkable today.  Pain was not reproducible.  We discussed red flag signs and symptoms warranting further evaluation.  Should she desire in the future if symptoms recur we can have her seen orthopedist that may be able to offer corticosteroid injection versus drainage.  2. Elevated blood pressure reading without diagnosis of hypertension Noted to be elevated x2.  Patient is asymptomatic.  Would like her to be seen in 1 week for recheck.   No orders of the defined types were placed in this encounter.  No orders of the defined types were placed in this encounter.    Rachael Norlander, DO [place] [phone]

## 2019-10-01 NOTE — Patient Instructions (Signed)
Your exam is NOT consistent with a blood clot. We talked about signs and symptoms of deep vein thrombosis today.  I suspect that you probably have a small Baker's cyst in that left knee.  We discussed that this could be further evaluated by orthopedics and potentially drained if it becomes bothersome.   Baker Cyst  A Baker cyst, also called a popliteal cyst, is a growth that forms at the back of the knee. The cyst forms when the fluid-filled sac (bursa) that cushions the knee joint becomes enlarged. What are the causes? In most cases, a Baker cyst results from another knee problem that causes swelling inside the knee. This makes the fluid inside the knee joint (synovial fluid) flow into the bursa behind the knee, causing the bursa to enlarge. What increases the risk? You may be more likely to develop a Baker cyst if you already have a knee problem, such as:  A tear in cartilage that cushions the knee joint (meniscal tear).  A tear in the tissues that connect the bones of the knee joint (ligament tear).  Knee swelling from osteoarthritis, rheumatoid arthritis, or gout. What are the signs or symptoms? The main symptom of this condition is a lump behind the knee. This may be the only symptom of the condition. The lump may be painful, especially when the knee is straightened. If the lump is painful, the pain may come and go. The knee may also be stiff. Symptoms may quickly get more severe if the cyst breaks open (ruptures). If the cyst ruptures, you may feel the following in your knee and calf:  Sudden or worsening pain.  Swelling.  Bruising.  Redness in the calf. A Baker cyst does not always cause symptoms. How is this diagnosed? This condition may be diagnosed based on your symptoms and medical history. Your health care provider will also do a physical exam. This may include:  Feeling the cyst to check whether it is tender.  Checking your knee for signs of another knee condition  that causes swelling. You may have imaging tests, such as:  X-rays.  MRI.  Ultrasound. How is this treated? A Baker cyst that is not painful may go away without treatment. If the cyst gets large or painful, it will likely get better if the underlying knee problem is treated. If needed, treatment for a Baker cyst may include:  Resting.  Keeping weight off of the knee. This means not leaning on the knee to support your body weight.  Taking NSAIDs, such as ibuprofen, to reduce pain and swelling.  Having a procedure to drain the fluid from the cyst with a needle (aspiration). You may also get an injection of a medicine that reduces swelling (steroid).  Having surgery. This may be needed if other treatments do not work. This usually involves correcting knee damage and removing the cyst. Follow these instructions at home:  Activity  Rest as told by your health care provider.  Avoid activities that make pain or swelling worse.  Return to your normal activities as told by your health care provider. Ask your health care provider what activities are safe for you.  Do not use the injured limb to support your body weight until your health care provider says that you can. Use crutches as told by your health care provider. General instructions  Take over-the-counter and prescription medicines only as told by your health care provider.  Keep all follow-up visits as told by your health care provider. This is  important. Contact a health care provider if:  You have knee pain, stiffness, or swelling that does not get better. Get help right away if:  You have sudden or worsening pain and swelling in your calf area. Summary  A Baker cyst, also called a popliteal cyst, is a growth that forms at the back of the knee.  In most cases, a Baker cyst results from another knee problem that causes swelling inside the knee.  A Baker cyst that is not painful may go away without treatment.  If  needed, treatment for a Baker cyst may include resting, keeping weight off of the knee, medicines, or draining fluid from the cyst.  Surgery may be needed if other treatments are not effective. This information is not intended to replace advice given to you by your health care provider. Make sure you discuss any questions you have with your health care provider. Document Revised: 11/02/2018 Document Reviewed: 11/02/2018 Elsevier Patient Education  [place].

## 2019-10-02 ENCOUNTER — Ambulatory Visit: Payer: Medicare HMO

## 2019-10-08 ENCOUNTER — Other Ambulatory Visit: Payer: Self-pay

## 2019-10-08 ENCOUNTER — Ambulatory Visit (INDEPENDENT_AMBULATORY_CARE_PROVIDER_SITE_OTHER): Payer: Medicare HMO | Admitting: *Deleted

## 2019-10-08 VITALS — BP 147/76 | HR 75

## 2019-10-08 DIAGNOSIS — R030 Elevated blood-pressure reading, without diagnosis of hypertension: Secondary | ICD-10-CM

## 2019-10-08 NOTE — Progress Notes (Signed)
Rachael Nguyen is in for a blood pressure check this morning. BP 147/76, HR 75. She brought readings from home that look good. Gave readings to Dr Darnell Level.

## 2019-10-15 ENCOUNTER — Ambulatory Visit (INDEPENDENT_AMBULATORY_CARE_PROVIDER_SITE_OTHER): Payer: Medicare HMO

## 2019-10-15 DIAGNOSIS — Z0000 Encounter for general adult medical examination without abnormal findings: Secondary | ICD-10-CM

## 2019-10-15 NOTE — Patient Instructions (Signed)
Rachael Nguyen , Thank you for taking time to come for your Medicare Wellness Visit. I appreciate your ongoing commitment to your health goals. Please review the following plan we discussed and let me know if I can assist you in the future.   These are the goals we discussed: Goals    . DIET - EAT MORE FRUITS AND VEGETABLES    . Exercise 150 min/wk Moderate Activity       This is a list of the screening recommended for you and due dates:  Health Maintenance  Topic Date Due  . Pneumonia vaccines (1 of 2 - PCV13) 10/14/2020*  . Flu Shot  02/02/2020  . Mammogram  06/12/2020  . Tetanus Vaccine  05/16/2021  . Colon Cancer Screening  02/09/2025  . DEXA scan (bone density measurement)  Completed  .  Hepatitis C: One time screening is recommended by Center for Disease Control  (CDC) for  adults born from [birth year] through 1963.   Completed  *Topic was postponed. The date shown is not the original due date.   Pneumococcal Conjugate Vaccine (PCV13): What You Need to Know 1. Why get vaccinated? Pneumococcal conjugate vaccine (PCV13) can prevent pneumococcal disease. Pneumococcal disease refers to any illness caused by pneumococcal bacteria. These bacteria can cause many types of illnesses, including pneumonia, which is an infection of the lungs. Pneumococcal bacteria are one of the most common causes of pneumonia. Besides pneumonia, pneumococcal bacteria can also cause:  Ear infections  Sinus infections  Meningitis (infection of the tissue covering the brain and spinal cord)  Bacteremia (bloodstream infection) Anyone can get pneumococcal disease, but children under 53 years of age, people with certain medical conditions, adults 17 years or older, and cigarette smokers are at the highest risk. Most pneumococcal infections are mild. However, some can result in long-term problems, such as brain damage or hearing loss. Meningitis, bacteremia, and pneumonia caused by pneumococcal disease can be fatal. 2.  PCV13 PCV13 protects against 13 types of bacteria that cause pneumococcal disease. Infants and young children usually need 4 doses of pneumococcal conjugate vaccine, at 2, 4, 6, and 24-70 months of age. In some cases, a child might need fewer than 4 doses to complete PCV13 vaccination. A dose of PCV23 vaccine is also recommended for anyone 2 years or older with certain medical conditions if they did not already receive PCV13. This vaccine may be given to adults 73 years or older based on discussions between the patient and health care provider. 3. Talk with your health care provider Tell your vaccine provider if the person getting the vaccine:  Has had an allergic reaction after a previous dose of PCV13, to an earlier pneumococcal conjugate vaccine known as PCV7, or to any vaccine containing diphtheria toxoid (for example, DTaP), or has any severe, life-threatening allergies.  In some cases, your health care provider may decide to postpone PCV13 vaccination to a future visit. People with minor illnesses, such as a cold, may be vaccinated. People who are moderately or severely ill should usually wait until they recover before getting PCV13. Your health care provider can give you more information. 4. Risks of a vaccine reaction  Redness, swelling, pain, or tenderness where the shot is given, and fever, loss of appetite, fussiness (irritability), feeling tired, headache, and chills can happen after PCV13. Young children may be at increased risk for seizures caused by fever after PCV13 if it is administered at the same time as inactivated influenza vaccine. Ask your health care provider  for more information. People sometimes faint after medical procedures, including vaccination. Tell your provider if you feel dizzy or have vision changes or ringing in the ears. As with any medicine, there is a very remote chance of a vaccine causing a severe allergic reaction, other serious injury, or death. 5. What  if there is a serious problem? An allergic reaction could occur after the vaccinated person leaves the clinic. If you see signs of a severe allergic reaction (hives, swelling of the face and throat, difficulty breathing, a fast heartbeat, dizziness, or weakness), call 9-1-1 and get the person to the nearest hospital. For other signs that concern you, call your health care provider. Adverse reactions should be reported to the Vaccine Adverse Event Reporting System (VAERS). Your health care provider will usually file this report, or you can do it yourself. Visit the VAERS website at www.vaers.SamedayNews.es or call 681 232 8617. VAERS is only for reporting reactions, and VAERS staff do not give medical advice. 6. The National Vaccine Injury Compensation Program The [employer] Vaccine Injury Compensation Program (VICP) is a federal program that was created to compensate people who may have been injured by certain vaccines. Visit the VICP website at GoldCloset.com.ee or call 609-118-9786 to learn about the program and about filing a claim. There is a time limit to file a claim for compensation. 7. How can I learn more?  Ask your health care provider.  Call your local or state health department.  Contact the Centers for Disease Control and Prevention (CDC): ? Call (418)296-3307 (1-800-CDC-INFO) or ? Visit CDC's website at http://hunter.com/ Vaccine Information Statement PCV13 Vaccine (05/02/2018) This information is not intended to replace advice given to you by your health care provider. Make sure you discuss any questions you have with your health care provider. Document Revised: 10/09/2018 Document Reviewed: 01/30/2018 Elsevier Patient Education  [place].  Pneumococcal Vaccine, Polyvalent solution for injection What is this medicine? PNEUMOCOCCAL VACCINE, POLYVALENT (NEU mo KOK al vak SEEN, pol ee VEY luhnt) is a vaccine to prevent pneumococcus bacteria infection. These  bacteria are a major cause of ear infections, Strep throat infections, and serious pneumonia, meningitis, or blood infections worldwide. These vaccines help the body to produce antibodies (protective substances) that help your body defend against these bacteria. This vaccine is recommended for people 71 years of age and older with health problems. It is also recommended for all adults over 44 years old. This vaccine will not treat an infection. This medicine may be used for other purposes; ask your health care provider or pharmacist if you have questions. COMMON BRAND NAME(S): Pneumovax 23 What should I tell my health care provider before I take this medicine? They need to know if you have any of these conditions:  bleeding problems  bone marrow or organ transplant  cancer, Hodgkin's disease  fever  infection  immune system problems  low platelet count in the blood  seizures  an unusual or allergic reaction to pneumococcal vaccine, diphtheria toxoid, other vaccines, latex, other medicines, foods, dyes, or preservatives  pregnant or trying to get pregnant  breast-feeding How should I use this medicine? This vaccine is for injection into a muscle or under the skin. It is given by a health care professional. A copy of Vaccine Information Statements will be given before each vaccination. Read this sheet carefully each time. The sheet may change frequently. Talk to your pediatrician regarding the use of this medicine in children. While this drug may be prescribed for children as young  as 20 years of age for selected conditions, precautions do apply. Overdosage: If you think you have taken too much of this medicine contact a poison control center or emergency room at once. NOTE: This medicine is only for you. Do not share this medicine with others. What if I miss a dose? It is important not to miss your dose. Call your doctor or health care professional if you are unable to keep an  appointment. What may interact with this medicine?  medicines for cancer chemotherapy  medicines that suppress your immune function  medicines that treat or prevent blood clots like warfarin, enoxaparin, and dalteparin  steroid medicines like prednisone or cortisone This list may not describe all possible interactions. Give your health care provider a list of all the medicines, herbs, non-prescription drugs, or dietary supplements you use. Also tell them if you smoke, drink alcohol, or use illegal drugs. Some items may interact with your medicine. What should I watch for while using this medicine? Mild fever and pain should go away in 3 days or less. Report any unusual symptoms to your doctor or health care professional. What side effects may I notice from receiving this medicine? Side effects that you should report to your doctor or health care professional as soon as possible:  allergic reactions like skin rash, itching or hives, swelling of the face, lips, or tongue  breathing problems  confused  fever over 102 degrees F  pain, tingling, numbness in the hands or feet  seizures  unusual bleeding or bruising  unusual muscle weakness Side effects that usually do not require medical attention (report to your doctor or health care professional if they continue or are bothersome):  aches and pains  diarrhea  fever of 102 degrees F or less  headache  irritable  loss of appetite  pain, tender at site where injected  trouble sleeping This list may not describe all possible side effects. Call your doctor for medical advice about side effects. You may report side effects to FDA at 1-800-FDA-1088. Where should I keep my medicine? This does not apply. This vaccine is given in a clinic, pharmacy, doctor's office, or other health care setting and will not be stored at home. NOTE: This sheet is a summary. It may not cover all possible information. If you have questions about  this medicine, talk to your doctor, pharmacist, or health care provider.  2020 Elsevier/Gold Standard (2008-01-25 14:32:37)   Advance Directive  Advance directives are legal documents that let you make choices ahead of time about your health care and medical treatment in case you become unable to communicate for yourself. Advance directives are a way for you to make known your wishes to family, friends, and health care providers. This can let others know about your end-of-life care if you become unable to communicate. Discussing and writing advance directives should happen over time rather than all at once. Advance directives can be changed depending on your situation and what you want, even after you have signed the advance directives. There are different types of advance directives, such as:  Medical power of attorney.  Living will.  Do not resuscitate (DNR) or do not attempt resuscitation (DNAR) order. Health care proxy and medical power of attorney A health care proxy is also called a health care agent. This is a person who is appointed to make medical decisions for you in cases where you are unable to make the decisions yourself. Generally, people choose someone they know well and  trust to represent their preferences. Make sure to ask this person for an agreement to act as your proxy. A proxy may have to exercise judgment in the event of a medical decision for which your wishes are not known. A medical power of attorney is a legal document that names your health care proxy. Depending on the laws in your state, after the document is written, it may also need to be:  Signed.  Notarized.  Dated.  Copied.  Witnessed.  Incorporated into your medical record. You may also want to appoint someone to manage your money in a situation in which you are unable to do so. This is called a durable power of attorney for finances. It is a separate legal document from the durable power of attorney  for health care. You may choose the same person or someone different from your health care proxy to act as your agent in money matters. If you do not appoint a proxy, or if there is a concern that the proxy is not acting in your best interests, a court may appoint a guardian to act on your behalf. Living will A living will is a set of instructions that state your wishes about medical care when you cannot express them yourself. Health care providers should keep a copy of your living will in your medical record. You may want to give a copy to family members or friends. To alert caregivers in case of an emergency, you can place a card in your wallet to let them know that you have a living will and where they can find it. A living will is used if you become:  Terminally ill.  Disabled.  Unable to communicate or make decisions. Items to consider in your living will include:  To use or not to use life-support equipment, such as dialysis machines and breathing machines (ventilators).  A DNR or DNAR order. This tells health care providers not to use cardiopulmonary resuscitation (CPR) if breathing or heartbeat stops.  To use or not to use tube feeding.  To be given or not to be given food and fluids.  Comfort (palliative) care when the goal becomes comfort rather than a cure.  Donation of organs and tissues. A living will does not give instructions for distributing your money and property if you should pass away. DNR or DNAR A DNR or DNAR order is a request not to have CPR in the event that your heart stops beating or you stop breathing. If a DNR or DNAR order has not been made and shared, a health care provider will try to help any patient whose heart has stopped or who has stopped breathing. If you plan to have surgery, talk with your health care provider about how your DNR or DNAR order will be followed if problems occur. What if I do not have an advance directive? If you do not have an advance  directive, some states assign family decision makers to act on your behalf based on how closely you are related to them. Each state has its own laws about advance directives. You may want to check with your health care provider, attorney, or state representative about the laws in your state. Summary  Advance directives are the legal documents that allow you to make choices ahead of time about your health care and medical treatment in case you become unable to tell others about your care.  The process of discussing and writing advance directives should happen over time. You can  change the advance directives, even after you have signed them.  Advance directives include DNR or DNAR orders, living wills, and designating an agent as your medical power of attorney. This information is not intended to replace advice given to you by your health care provider. Make sure you discuss any questions you have with your health care provider. Document Revised: 01/17/2019 Document Reviewed: 01/17/2019 Elsevier Patient Education  [place].

## 2019-10-15 NOTE — Progress Notes (Signed)
MEDICARE ANNUAL WELLNESS VISIT  10/15/2019  Telephone Visit Disclaimer This Medicare AWV was conducted by telephone due to national recommendations for restrictions regarding the COVID-19 Pandemic (e.g. social distancing).  I verified, using two identifiers, that I am speaking with Rachael Nguyen or their authorized healthcare agent. I discussed the limitations, risks, security, and privacy concerns of performing an evaluation and management service by telephone and the potential availability of an in-person appointment in the future. The patient expressed understanding and agreed to proceed.   Subjective:  Rachael Nguyen is a 69 y.o. female patient of Rachael Norlander, DO who had a Medicare Annual Wellness Visit today via telephone. Cheney is a very pleasant patient who resides locally here in [place]. 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 She tries to walk and use her exercise bike as much as she can. She recently has been suffering from back pain and that has halted her exercise routine. She is married and lives with her husband. They have no children. She enjoys working in her yard and flowers, decorating, and shopping.   Patient Care Team: Rachael Norlander, DO as PCP - General (Family Medicine)  Advanced Directives 10/15/2019 02/25/2019 01/28/2015  Does Patient Have a Medical Advance Directive? No No No  Would patient like information on creating a medical advance directive? Yes (ED - Information included in AVS) - No - patient declined information   Information mailed to patient per her request  Hospital Utilization Over the Past 12 Months: # of hospitalizations or ER visits: 0 # of surgeries: 0  Review of Systems    Patient reports that her overall health is unchanged compared to last  year.  History obtained from chart review  Patient Reported Readings (BP, Pulse, CBG, Weight, etc) none  Pain Assessment Pain : 0-10 Pain Score: 5  Pain Type: Chronic pain Pain Location: Back Pain Onset: More than a month ago Pain Frequency: Intermittent     Current Medications & Allergies (verified) Allergies as of 10/15/2019      Reactions   Vicodin [hydrocodone-acetaminophen] Nausea Only   dizziness   Allegra-d [fexofenadine-pseudoephed Er] Nausea Only   Codeine Other (See Comments)   GI upset; dizziness      Medication List       Accurate as of October 15, 2019  9:59 AM. If you have any questions, ask your nurse or doctor.        aspirin EC 81 MG tablet Take 81 mg by mouth daily.   CALCIUM PO Take by mouth 2 (two) times daily.   famotidine 40 MG tablet Commonly known as: PEPCID Take 40 mg by mouth daily.   ibuprofen 600 MG tablet Commonly known as: ADVIL Take 1 tablet (600 mg total) by mouth every 8 (eight) hours as needed for moderate pain.   pravastatin 40 MG tablet Commonly known as: PRAVACHOL TAKE 1 TABLET (40 MG TOTAL) BY MOUTH DAILY.   VITAMIN C ADULT GUMMIES PO Take by mouth. Takes 2 daily   Vitamin D3 25 MCG (1000 UT) Caps Take by mouth.       History (reviewed): Past Medical History:  Diagnosis Date  . Hyperlipidemia   . Osteopenia 04/2015 T score -2.4   FRAX not calculated   Past Surgical History:  Procedure Laterality  Date  . COMBINED HYSTEROSCOPY DIAGNOSTIC / D&C  2011   polyp  . DENTAL SURGERY     3 teeth extracted   Family History  Problem Relation Age of Onset  . Breast cancer Paternal Aunt   . Hypertension Mother   . Dementia Mother   . Leukemia Father   . Colon cancer Neg Hx   . Esophageal cancer Neg Hx   . Pancreatic cancer Neg Hx   . Rectal cancer Neg Hx   . Stomach cancer Neg Hx   . Ulcerative colitis Neg Hx   . Crohn's disease Neg Hx    Social History   Socioeconomic History  . Marital status: Married     Spouse name: Not on file  . Number of children: 0  . Years of education: Not on file  . Highest education level: High school graduate  Occupational History  . Occupation: Retired  Tobacco Use  . Smoking status: Never Smoker  . Smokeless tobacco: Never Used  Substance and Sexual Activity  . Alcohol use: No    Alcohol/week: 0.0 standard drinks  . Drug use: No  . Sexual activity: Yes  Other Topics Concern  . Not on file  Social History Narrative  . Not on file   Social Determinants of Health   Financial Resource Strain:   . Difficulty of Paying Living Expenses:   Food Insecurity:   . Worried About Charity fundraiser in the Last Year:   . Arboriculturist in the Last Year:   Transportation Needs:   . Film/video editor (Medical):   Marland Kitchen Lack of Transportation (Non-Medical):   Physical Activity:   . Days of Exercise per Week:   . Minutes of Exercise per Session:   Stress:   . Feeling of Stress :   Social Connections:   . Frequency of Communication with Friends and Family:   . Frequency of Social Gatherings with Friends and Family:   . Attends Religious Services:   . Active Member of Clubs or Organizations:   . Attends Archivist Meetings:   Marland Kitchen Marital Status:     Activities of Daily Living In your present state of health, do you have any difficulty performing the following activities: 10/15/2019  Hearing? N  Vision? Y  Comment Wears reading glasses  Difficulty concentrating or making decisions? N  Walking or climbing stairs? N  Dressing or bathing? N  Doing errands, shopping? N  Preparing Food and eating ? N  Using the Toilet? N  In the past six months, have you accidently leaked urine? N  Do you have problems with loss of bowel control? N  Managing your Medications? N  Managing your Finances? N  Housekeeping or managing your Housekeeping? N  Some recent data might be hidden    Patient Education/ Literacy How often do you need to have someone help  you when you read instructions, pamphlets, or other written materials from your doctor or pharmacy?: 1 - Never What is the last grade level you completed in school?: High school graduate  Exercise Current Exercise Habits: The patient does not participate in regular exercise at present, Exercise limited by: orthopedic condition(s)  Diet Patient reports consuming 3 meals a day and 2 snack(s) a day Patient reports that her primary diet is: Regular Patient reports that she does have regular access to food.   Depression Screen PHQ 2/9 Scores 10/01/2019 02/21/2019 01/24/2019 12/04/2017 02/21/2017 10/06/2016 07/28/2016  PHQ - 2 Score 0  0 0 0 0 0 0  PHQ- 9 Score 0 0 0 - - - -     Fall Risk Fall Risk  10/15/2019 10/01/2019 12/04/2017 02/21/2017 10/06/2016  Falls in the past year? 0 0 No No No     Objective:  Rachael Nguyen seemed alert and oriented and she participated appropriately during our telephone visit.  Blood Pressure Weight BMI  BP Readings from Last 3 Encounters:  10/08/19 (!) 147/76  10/01/19 (!) 155/105  02/21/19 138/80   Wt Readings from Last 3 Encounters:  10/01/19 221 lb (100.2 kg)  02/21/19 215 lb (97.5 kg)  01/24/19 223 lb (101.2 kg)   BMI Readings from Last 1 Encounters:  10/01/19 37.93 kg/m    *Unable to obtain current vital signs, weight, and BMI due to telephone visit type  Hearing/Vision  . Kenise did not seem to have difficulty with hearing/understanding during the telephone conversation . Reports that she has not had a formal eye exam by an eye care professional within the past year . Reports that she has not had a formal hearing evaluation within the past year *Unable to fully assess hearing and vision during telephone visit type  Cognitive Function: 6CIT Screen 10/15/2019  What Year? 0 points  What month? 0 points  What time? 0 points  Count back from 20 0 points  Months in reverse 0 points  Repeat phrase 0 points  Total Score 0   (Normal:0-7, Significant  for Dysfunction: >8)  Normal Cognitive Function Screening: Yes   Immunization & Health Maintenance Record Immunization History  Administered Date(s) Administered  . Moderna SARS-COVID-2 Vaccination 08/08/2019, 09/09/2019  . Tdap 05/17/2011    Health Maintenance  Topic Date Due  . PNA vac Low Risk Adult (1 of 2 - PCV13) 10/14/2020 (Originally 05/24/2018)  . INFLUENZA VACCINE  02/02/2020  . MAMMOGRAM  06/12/2020  . TETANUS/TDAP  05/16/2021  . COLONOSCOPY  02/09/2025  . DEXA SCAN  Completed  . Hepatitis C Screening  Completed       Assessment  This is a routine wellness examination for Merlina Alderfer.  Health Maintenance: Due or Overdue There are no preventive care reminders to display for this patient.  Rachael Nguyen does not need a referral for Community Assistance: Care Management:   no Social Work:    no Prescription Assistance:  no Nutrition/Diabetes Education:  no   Plan:  Personalized Goals Goals Addressed            This Visit's Progress   . DIET - EAT MORE FRUITS AND VEGETABLES      . Exercise 150 min/wk Moderate Activity        Personalized Health Maintenance & Screening Recommendations  Patient needs to schedule and an eye exam  Lung Cancer Screening Recommended: no (Low Dose CT Chest recommended if Age 35-80 years, 30 pack-year currently smoking OR have quit w/in past 15 years) Hepatitis C Screening recommended: no HIV Screening recommended: no  Advanced Directives: Written information was not prepared per patient's request.  Referrals & Orders No orders of the defined types were placed in this encounter.   Follow-up Plan . Follow-up with Rachael Norlander, DO as planned . Schedule for an eye exam     I have personally reviewed and noted the following in the patient's chart:   . Medical and social history . Use of alcohol, tobacco or illicit drugs  . Current medications and supplements . Functional ability and  status . Nutritional status .  Physical activity . Advanced directives . List of other physicians . Hospitalizations, surgeries, and ER visits in previous 12 months . Vitals . Screenings to include cognitive, depression, and falls . Referrals and appointments  In addition, I have reviewed and discussed with Rachael Nguyen certain preventive protocols, quality metrics, and best practice recommendations. A written personalized care plan for preventive services as well as general preventive health recommendations is available and can be mailed to the patient at her request.      Rolena Infante LPN [id number]

## 2019-11-04 DIAGNOSIS — R69 Illness, unspecified: Secondary | ICD-10-CM | POA: Diagnosis not present

## 2020-01-03 ENCOUNTER — Encounter: Payer: Medicare HMO | Admitting: Family Medicine

## 2020-01-18 ENCOUNTER — Other Ambulatory Visit: Payer: Self-pay | Admitting: Family Medicine

## 2020-01-23 ENCOUNTER — Telehealth: Payer: Self-pay | Admitting: Family Medicine

## 2020-01-27 ENCOUNTER — Ambulatory Visit
Admission: RE | Admit: 2020-01-27 | Discharge: 2020-01-27 | Disposition: A | Discharge status: Home / Self Care | Payer: Medicare HMO | Source: Ambulatory Visit | Attending: Family Medicine | Admitting: Family Medicine

## 2020-01-27 ENCOUNTER — Other Ambulatory Visit: Payer: Self-pay

## 2020-01-27 DIAGNOSIS — Z1231 Encounter for screening mammogram for malignant neoplasm of breast: Secondary | ICD-10-CM

## 2020-02-04 IMAGING — DX LUMBAR SPINE - 2-3 VIEW
2 series · 2 of 2 positions shown · non-contrast
Comparison: None.

CLINICAL DATA: Lumbago with left-sided buttocks region radicular
symptoms

EXAM:
LUMBAR SPINE - 2-3 VIEW

[l-spine ap]
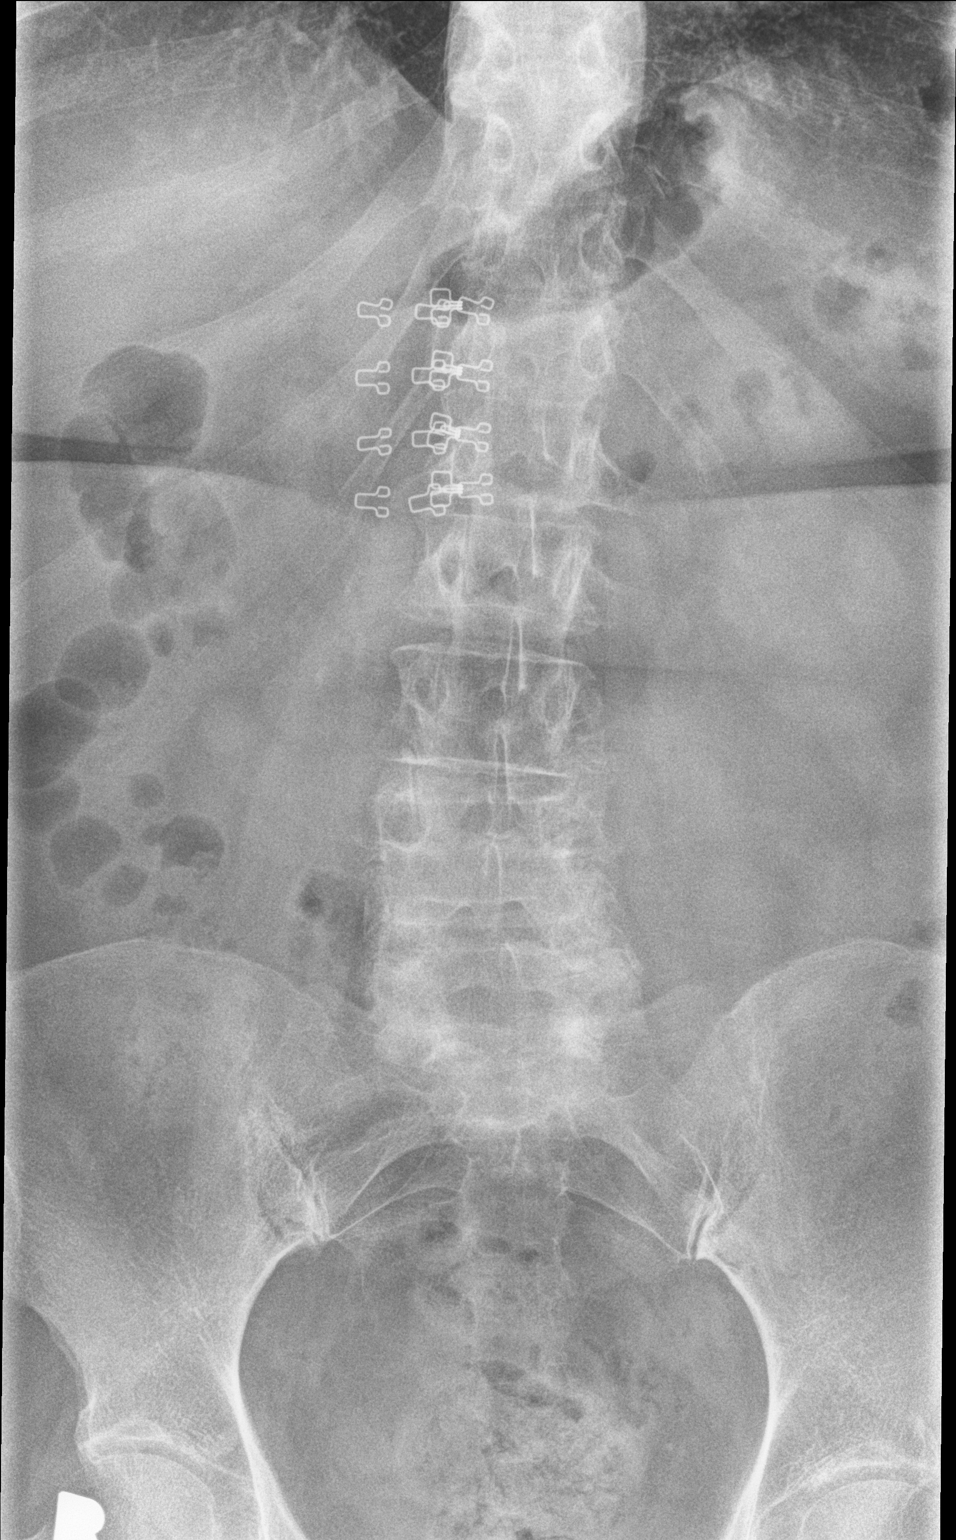

[l-spine lat]
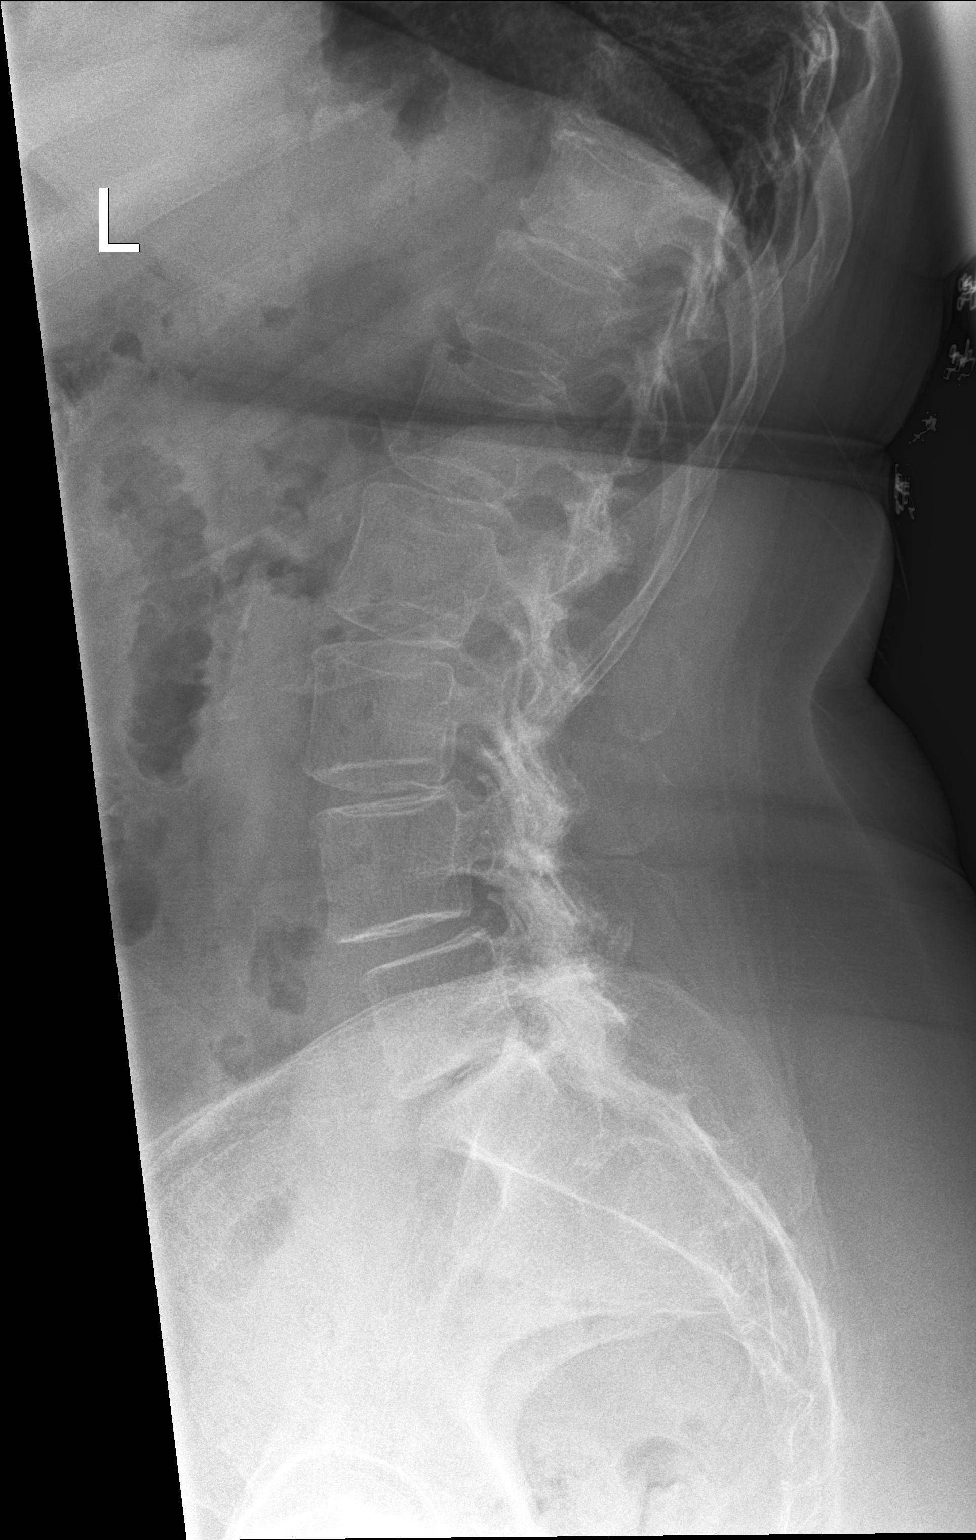

[2 of 2 positions shown; findings below may reference images not displayed]

FINDINGS: Frontal and lateral views obtained. There are 5 non-rib-bearing
lumbar type vertebral bodies. There is lumbar dextroscoliosis. There
is no fracture or spondylolisthesis. There is moderately severe disc
space narrowing at L5-S1. There is milder disc space narrowing at
L3-4 and L4-5. There is facet osteoarthritic change at L4-5 and
L5-S1 bilaterally.
IMPRESSION: Scoliosis. Osteoarthritic change, most notably at L5-S1. No fracture
or spondylolisthesis.

## 2020-02-11 ENCOUNTER — Other Ambulatory Visit: Payer: Self-pay

## 2020-02-11 ENCOUNTER — Encounter: Payer: Self-pay | Admitting: Family Medicine

## 2020-02-11 ENCOUNTER — Ambulatory Visit (INDEPENDENT_AMBULATORY_CARE_PROVIDER_SITE_OTHER): Payer: Medicare HMO | Admitting: Family Medicine

## 2020-02-11 VITALS — BP 142/80 | HR 84 | Temp 97.9°F | Ht 64.0 in | Wt 225.0 lb

## 2020-02-11 DIAGNOSIS — Z0000 Encounter for general adult medical examination without abnormal findings: Secondary | ICD-10-CM

## 2020-02-11 DIAGNOSIS — E782 Mixed hyperlipidemia: Secondary | ICD-10-CM | POA: Diagnosis not present

## 2020-02-11 DIAGNOSIS — M8580 Other specified disorders of bone density and structure, unspecified site: Secondary | ICD-10-CM | POA: Diagnosis not present

## 2020-02-11 DIAGNOSIS — Z130 Encounter for screening for diseases of the blood and blood-forming organs and certain disorders involving the immune mechanism: Secondary | ICD-10-CM | POA: Diagnosis not present

## 2020-02-11 DIAGNOSIS — Z0001 Encounter for general adult medical examination with abnormal findings: Secondary | ICD-10-CM

## 2020-02-11 DIAGNOSIS — R030 Elevated blood-pressure reading, without diagnosis of hypertension: Secondary | ICD-10-CM

## 2020-02-11 DIAGNOSIS — R739 Hyperglycemia, unspecified: Secondary | ICD-10-CM | POA: Diagnosis not present

## 2020-02-11 LAB — BAYER DCA HB A1C WAIVED: HB A1C (BAYER DCA - WAIVED): 5.6 % (ref <7.0)

## 2020-02-11 MED ORDER — PRAVASTATIN SODIUM 40 MG PO TABS: ORAL_TABLET | ORAL | 3 refills | Status: DC

## 2020-02-11 NOTE — Patient Instructions (Signed)
Blue light filter glasses  You had labs performed today.  You will be contacted with the results of the labs once they are available, usually in the next 3 business days for routine lab work.  If you have an active my chart account, they will be released to your MyChart.  If you prefer to have these labs released to you via telephone, please let us know.  If you had a pap smear or biopsy performed, expect to be contacted in about 7-10 days.   Preventive Care 67 Years and Older, Female Preventive care refers to lifestyle choices and visits with your health care provider that can promote health and wellness. This includes:  A yearly physical exam. This is also called an annual well check.  Regular dental and eye exams.  Immunizations.  Screening for certain conditions.  Healthy lifestyle choices, such as diet and exercise. What can I expect for my preventive care visit? Physical exam Your health care provider will check:  Height and weight. These may be used to calculate body mass index (BMI), which is a measurement that tells if you are at a healthy weight.  Heart rate and blood pressure.  Your skin for abnormal spots. Counseling Your health care provider may ask you questions about:  Alcohol, tobacco, and drug use.  Emotional well-being.  Home and relationship well-being.  Sexual activity.  Eating habits.  History of falls.  Memory and ability to understand (cognition).  Work and work Statistician.  Pregnancy and menstrual history. What immunizations do I need?  Influenza (flu) vaccine  This is recommended every year. Tetanus, diphtheria, and pertussis (Tdap) vaccine  You may need a Td booster every 10 years. Varicella (chickenpox) vaccine  You may need this vaccine if you have not already been vaccinated. Zoster (shingles) vaccine  You may need this after age 25. Pneumococcal conjugate (PCV13) vaccine  One dose is recommended after age 67. Pneumococcal  polysaccharide (PPSV23) vaccine  One dose is recommended after age 67. Measles, mumps, and rubella (MMR) vaccine  You may need at least one dose of MMR if you were born in 1957 or later. You may also need a second dose. Meningococcal conjugate (MenACWY) vaccine  You may need this if you have certain conditions. Hepatitis A vaccine  You may need this if you have certain conditions or if you travel or work in places where you may be exposed to hepatitis A. Hepatitis B vaccine  You may need this if you have certain conditions or if you travel or work in places where you may be exposed to hepatitis B. Haemophilus influenzae type b (Hib) vaccine  You may need this if you have certain conditions. You may receive vaccines as individual doses or as more than one vaccine together in one shot (combination vaccines). Talk with your health care provider about the risks and benefits of combination vaccines. What tests do I need? Blood tests  Lipid and cholesterol levels. These may be checked every 5 years, or more frequently depending on your overall health.  Hepatitis C test.  Hepatitis B test. Screening  Lung cancer screening. You may have this screening every year starting at age 67 if you have a 30-pack-year history of smoking and currently smoke or have quit within the past 15 years.  Colorectal cancer screening. All adults should have this screening starting at age 67 and continuing until age 23. Your health care provider may recommend screening at age 35 if you are at increased risk. You  will have tests every 1-10 years, depending on your results and the type of screening test.  Diabetes screening. This is done by checking your blood sugar (glucose) after you have not eaten for a while (fasting). You may have this done every 1-3 years.  Mammogram. This may be done every 1-2 years. Talk with your health care provider about how often you should have regular mammograms.  BRCA-related  cancer screening. This may be done if you have a family history of breast, ovarian, tubal, or peritoneal cancers. Other tests  Sexually transmitted disease (STD) testing.  Bone density scan. This is done to screen for osteoporosis. You may have this done starting at age 67. Follow these instructions at home: Eating and drinking  Eat a diet that includes fresh fruits and vegetables, whole grains, lean protein, and low-fat dairy products. Limit your intake of foods with high amounts of sugar, saturated fats, and salt.  Take vitamin and mineral supplements as recommended by your health care provider.  Do not drink alcohol if your health care provider tells you not to drink.  If you drink alcohol: ? Limit how much you have to 0-1 drink a day. ? Be aware of how much alcohol is in your drink. In the [place] one drink equals one 12 oz bottle of beer (355 mL), one 5 oz glass of wine (148 mL), or one 1 oz glass of hard liquor (44 mL). Lifestyle  Take daily care of your teeth and gums.  Stay active. Exercise for at least 30 minutes on 5 or more days each week.  Do not use any products that contain nicotine or tobacco, such as cigarettes, e-cigarettes, and chewing tobacco. If you need help quitting, ask your health care provider.  If you are sexually active, practice safe sex. Use a condom or other form of protection in order to prevent STIs (sexually transmitted infections).  Talk with your health care provider about taking a low-dose aspirin or statin. What's next?  Go to your health care provider once a year for a well check visit.  Ask your health care provider how often you should have your eyes and teeth checked.  Stay up to date on all vaccines. This information is not intended to replace advice given to you by your health care provider. Make sure you discuss any questions you have with your health care provider. Document Revised: 06/14/2018 Document Reviewed: 06/14/2018 Elsevier  Patient Education  2020 [employer].

## 2020-02-11 NOTE — Progress Notes (Signed)
Rachael Nguyen is a 69 y.o. female presents to office today for annual physical exam examination.    Concerns today include: 1. none  Marital status: Married, Substance use: none Diet: fair. Occasionally diets, Exercise: no structured.   Last eye exam: needs Last dental exam: UTD Last colonoscopy: UTD Last mammogram: UTD Last pap smear: UTD  Past Medical History:  Diagnosis Date  . Hyperlipidemia   . Osteopenia 04/2015 T score -2.4   FRAX not calculated   Social History   Socioeconomic History  . Marital status: Married    Spouse name: Not on file  . Number of children: 0  . Years of education: Not on file  . Highest education level: High school graduate  Occupational History  . Occupation: Retired  Tobacco Use  . Smoking status: Never Smoker  . Smokeless tobacco: Never Used  Vaping Use  . Vaping Use: Never used  Substance and Sexual Activity  . Alcohol use: No    Alcohol/week: 0.0 standard drinks  . Drug use: No  . Sexual activity: Yes  Other Topics Concern  . Not on file  Social History Narrative  . Not on file   Social Determinants of Health   Financial Resource Strain:   . Difficulty of Paying Living Expenses:   Food Insecurity:   . Worried About Charity fundraiser in the Last Year:   . Arboriculturist in the Last Year:   Transportation Needs:   . Film/video editor (Medical):   Marland Kitchen Lack of Transportation (Non-Medical):   Physical Activity:   . Days of Exercise per Week:   . Minutes of Exercise per Session:   Stress:   . Feeling of Stress :   Social Connections:   . Frequency of Communication with Friends and Family:   . Frequency of Social Gatherings with Friends and Family:   . Attends Religious Services:   . Active Member of Clubs or Organizations:   . Attends Archivist Meetings:   Marland Kitchen Marital Status:   Intimate Partner Violence:   . Fear of Current or Ex-Partner:   . Emotionally Abused:   Marland Kitchen Physically Abused:   .  Sexually Abused:    Past Surgical History:  Procedure Laterality Date  . COMBINED HYSTEROSCOPY DIAGNOSTIC / D&C  2011   polyp  . DENTAL SURGERY     3 teeth extracted   Family History  Problem Relation Age of Onset  . Breast cancer Paternal Aunt   . Hypertension Mother   . Dementia Mother   . Leukemia Father   . Colon cancer Neg Hx   . Esophageal cancer Neg Hx   . Pancreatic cancer Neg Hx   . Rectal cancer Neg Hx   . Stomach cancer Neg Hx   . Ulcerative colitis Neg Hx   . Crohn's disease Neg Hx     Current Outpatient Medications:  .  Ascorbic Acid (VITAMIN C ADULT GUMMIES PO), Take by mouth. Takes 2 daily, Disp: , Rfl:  .  CALCIUM PO, Take by mouth 2 (two) times daily., Disp: , Rfl:  .  Cholecalciferol (VITAMIN D3) 1000 UNITS CAPS, Take by mouth., Disp: , Rfl:  .  famotidine (PEPCID) 40 MG tablet, Take 40 mg by mouth daily., Disp: , Rfl:  .  ibuprofen (ADVIL) 600 MG tablet, Take 1 tablet (600 mg total) by mouth every 8 (eight) hours as needed for moderate pain., Disp: 30 tablet, Rfl: 1 .  pravastatin (PRAVACHOL) 40  MG tablet, TAKE 1 TABLET BY MOUTH EVERY DAY, Disp: 90 tablet, Rfl: 0  Allergies  Allergen Reactions  . Vicodin [Hydrocodone-Acetaminophen] Nausea Only    dizziness  . Allegra-D [Fexofenadine-Pseudoephed Er] Nausea Only  . Codeine Other (See Comments)    GI upset; dizziness     ROS: Review of Systems A comprehensive review of systems was negative except for: Musculoskeletal: positive for left knee baker's cyst flare a few days ago Motion sickness with computers, certain lights    Physical exam BP (!) 142/80   Pulse 84   Temp 97.9 F (36.6 C) (Temporal)   Ht _0  (1.626 m)   Wt 225 lb (102.1 kg)   SpO2 94%   BMI 38.62 kg/m  General appearance: alert, cooperative, appears stated age, no distress and moderately obese Head: Normocephalic, without obvious abnormality, atraumatic Eyes: negative findings: lids and lashes normal, conjunctivae and sclerae  normal, corneas clear and pupils equal, round, reactive to light and accomodation Ears: normal TM's and external ear canals both ears Nose: Nares normal. Septum midline. Mucosa normal. No drainage or sinus tenderness. Throat: lips, mucosa, and tongue normal; teeth and gums normal Neck: no adenopathy, supple, symmetrical, trachea midline and thyroid not enlarged, symmetric, no tenderness/mass/nodules Back: symmetric, no curvature. ROM normal. No CVA tenderness. Lungs: clear to auscultation bilaterally Heart: regular rate and rhythm, S1, S2 normal, no murmur, click, rub or gallop Abdomen: soft, non-tender; bowel sounds normal; no masses,  no organomegaly Extremities: extremities normal, atraumatic, no cyanosis or edema Pulses: 2+ and symmetric Skin: Multiple areas of sun damage along bilateral upper extremities Lymph nodes: Cervical, supraclavicular, and axillary nodes normal. Neurologic: Alert and oriented X 3, normal strength and tone. Normal symmetric reflexes. Normal coordination and gait Psych: Mood stable, speech normal, affect appropriate, pleasant and interactive Depression screen [facility] 2/9 02/11/2020 10/01/2019 02/21/2019  Decreased Interest 0 0 0  Down, Depressed, Hopeless 0 0 0  PHQ - 2 Score 0 0 0  Altered sleeping 0 0 0  Tired, decreased energy 0 0 0  Change in appetite 0 0 0  Feeling bad or failure about yourself  0 0 0  Trouble concentrating 0 0 0  Moving slowly or fidgety/restless 0 0 0  Suicidal thoughts 0 0 0  PHQ-9 Score 0 0 0    Assessment/ Plan: [name] here for annual physical exam.   1. Annual physical exam  2. Mixed hyperlipidemia Check fasting lipid - Lipid Panel - CMP14+EGFR  3. Morbid obesity ([place]) Working on lifestyle modification.  Low-carb, high-fiber diet reinforced - Lipid Panel - CMP14+EGFR - TSH - Bayer DCA Hb A1c Waived  4. White coat syndrome without hypertension Repeat blood pressure much better - CMP14+EGFR  5. Osteopenia,  unspecified location Check vitamin D, calcium - CMP14+EGFR - VITAMIN D 25 Hydroxy (Vit-D Deficiency, Fractures)  6. Screening, anemia, deficiency, iron - CBC   Counseled on healthy lifestyle choices, including diet (rich in fruits, vegetables and lean meats and low in salt and simple carbohydrates) and exercise (at least 30 minutes of moderate physical activity daily).  Patient to follow up in 1 year for annual exam or sooner if needed.  Mckyle Solanki M. Lajuana Ripple, DO

## 2020-02-12 LAB — LIPID PANEL
Chol/HDL Ratio: 2.5 ratio (ref 0.0–4.4)
Cholesterol, Total: 159 mg/dL (ref 100–199)
HDL: 64 mg/dL (ref >39)
LDL Chol Calc (NIH): 82 mg/dL (ref 0–99)
Triglycerides: 69 mg/dL (ref 0–149)
VLDL Cholesterol Cal: 13 mg/dL (ref 5–40)

## 2020-02-12 LAB — CMP14+EGFR
ALT: 13 IU/L (ref 0–32)
AST: 14 IU/L (ref 0–40)
Albumin/Globulin Ratio: 1.9 (ref 1.2–2.2)
Albumin: 4.2 g/dL (ref 3.8–4.8)
Alkaline Phosphatase: 73 IU/L (ref 48–121)
BUN/Creatinine Ratio: 20 (ref 12–28)
BUN: 15 mg/dL (ref 8–27)
Bilirubin Total: 0.6 mg/dL (ref 0.0–1.2)
CO2: 23 mmol/L (ref 20–29)
Calcium: 9.2 mg/dL (ref 8.7–10.3)
Chloride: 104 mmol/L (ref 96–106)
Creatinine, Ser: 0.74 mg/dL (ref 0.57–1.00)
GFR calc Af Amer: 98 mL/min/{1.73_m2} (ref >59)
GFR calc non Af Amer: 85 mL/min/{1.73_m2} (ref >59)
Globulin, Total: 2.2 g/dL (ref 1.5–4.5)
Glucose: 91 mg/dL (ref 65–99)
Potassium: 4.2 mmol/L (ref 3.5–5.2)
Sodium: 141 mmol/L (ref 134–144)
Total Protein: 6.4 g/dL (ref 6.0–8.5)

## 2020-02-12 LAB — CBC
Hematocrit: 41 % (ref 34.0–46.6)
Hemoglobin: 13.5 g/dL (ref 11.1–15.9)
MCH: 29 pg (ref 26.6–33.0)
MCHC: 32.9 g/dL (ref 31.5–35.7)
MCV: 88 fL (ref 79–97)
Platelets: 262 10*3/uL (ref 150–450)
RBC: 4.66 x10E6/uL (ref 3.77–5.28)
RDW: 13.7 % (ref 11.7–15.4)
WBC: 5 10*3/uL (ref 3.4–10.8)

## 2020-02-12 LAB — VITAMIN D 25 HYDROXY (VIT D DEFICIENCY, FRACTURES): Vit D, 25-Hydroxy: 45.7 ng/mL (ref 30.0–100.0)

## 2020-02-12 LAB — TSH: TSH: 2.94 u[IU]/mL (ref 0.450–4.500)

## 2020-02-21 ENCOUNTER — Telehealth: Payer: Self-pay | Admitting: Family Medicine

## 2020-02-21 NOTE — Telephone Encounter (Signed)
Pt aware of results 

## 2020-02-25 ENCOUNTER — Telehealth: Payer: Self-pay | Admitting: Family Medicine

## 2020-02-25 NOTE — Telephone Encounter (Signed)
Pt already knows her results and would like a copy mailed to her. Copy printed and mailed to pt.

## 2020-03-06 DIAGNOSIS — K023 Arrested dental caries: Secondary | ICD-10-CM | POA: Diagnosis not present

## 2020-03-11 DIAGNOSIS — R69 Illness, unspecified: Secondary | ICD-10-CM | POA: Diagnosis not present

## 2020-03-19 DIAGNOSIS — R69 Illness, unspecified: Secondary | ICD-10-CM | POA: Diagnosis not present

## 2020-05-05 DIAGNOSIS — M8580 Other specified disorders of bone density and structure, unspecified site: Secondary | ICD-10-CM | POA: Diagnosis not present

## 2020-05-05 DIAGNOSIS — Z833 Family history of diabetes mellitus: Secondary | ICD-10-CM | POA: Diagnosis not present

## 2020-05-05 DIAGNOSIS — Z6837 Body mass index (BMI) 37.0-37.9, adult: Secondary | ICD-10-CM | POA: Diagnosis not present

## 2020-05-05 DIAGNOSIS — Z8249 Family history of ischemic heart disease and other diseases of the circulatory system: Secondary | ICD-10-CM | POA: Diagnosis not present

## 2020-05-05 DIAGNOSIS — E669 Obesity, unspecified: Secondary | ICD-10-CM | POA: Diagnosis not present

## 2020-05-05 DIAGNOSIS — Z809 Family history of malignant neoplasm, unspecified: Secondary | ICD-10-CM | POA: Diagnosis not present

## 2020-05-05 DIAGNOSIS — E785 Hyperlipidemia, unspecified: Secondary | ICD-10-CM | POA: Diagnosis not present

## 2020-05-05 DIAGNOSIS — K219 Gastro-esophageal reflux disease without esophagitis: Secondary | ICD-10-CM | POA: Diagnosis not present

## 2020-06-30 DIAGNOSIS — Z20822 Contact with and (suspected) exposure to covid-19: Secondary | ICD-10-CM | POA: Diagnosis not present

## 2020-08-11 DIAGNOSIS — R69 Illness, unspecified: Secondary | ICD-10-CM | POA: Diagnosis not present

## 2020-08-11 DIAGNOSIS — I10 Essential (primary) hypertension: Secondary | ICD-10-CM | POA: Diagnosis not present

## 2020-08-12 ENCOUNTER — Other Ambulatory Visit: Payer: Self-pay

## 2020-08-12 ENCOUNTER — Encounter: Payer: Self-pay | Admitting: Family Medicine

## 2020-08-12 ENCOUNTER — Ambulatory Visit (INDEPENDENT_AMBULATORY_CARE_PROVIDER_SITE_OTHER): Payer: Medicare HMO | Admitting: Family Medicine

## 2020-08-12 VITALS — BP 172/82 | HR 68 | Temp 98.1°F | Ht 64.0 in | Wt 224.4 lb

## 2020-08-12 DIAGNOSIS — R0789 Other chest pain: Secondary | ICD-10-CM

## 2020-08-12 DIAGNOSIS — R42 Dizziness and giddiness: Secondary | ICD-10-CM

## 2020-08-12 DIAGNOSIS — R000 Tachycardia, unspecified: Secondary | ICD-10-CM

## 2020-08-12 DIAGNOSIS — R0602 Shortness of breath: Secondary | ICD-10-CM

## 2020-08-12 LAB — CBC WITH DIFFERENTIAL/PLATELET: Lymphs: 42 %

## 2020-08-12 LAB — CMP14+EGFR

## 2020-08-12 NOTE — Progress Notes (Signed)
Assessment & Plan:  1. Chest discomfort We will do a lab work-up and refer to cardiology. - CBC with Differential/Platelet - CMP14+EGFR - Thyroid Panel With TSH - Magnesium - Ambulatory referral to Cardiology  2. Tachycardia We will do a lab work-up and refer to cardiology. - EKG 12-Lead - sinus rhythm - CBC with Differential/Platelet - CMP14+EGFR - Thyroid Panel With TSH - Magnesium - Ambulatory referral to Cardiology  3. Light headedness We will do a lab work-up and refer to cardiology. - CBC with Differential/Platelet - CMP14+EGFR - Thyroid Panel With TSH - Magnesium  4. Exertional shortness of breath We will do a lab work-up and refer to cardiology. - Ambulatory referral to Cardiology   Follow up plan: Return if symptoms worsen or fail to improve.  Hendricks Limes, MSN, APRN, FNP-C [facility]  Subjective:   Patient ID: Rachael Nguyen, female    DOB: Aug 12, 1950, 70 y.o.   MRN: [id number]  HPI: Rachael Nguyen is a 70 y.o. female presenting on 08/12/2020 for Tachycardia (Patient states that Tuesday morning at 2am she felt some discomfort in her chest so she got up to take her bp and it was elevated and her HR was 140. EMS was called )  Patient reports Tuesday morning at 2 AM she rolled over in bed and noticed that she was having some discomfort in her chest.  It did not radiate anywhere.  She got up to check her blood pressure and got the following readings very close together:  BP 157/116, HR 136 BP 184/125, HR 149 BP 172/128, HR 142. She did call EMS and reports that once they calm down her anxiety her blood pressure went down.  She does not recall drinking anything that could have increased her anxiety.  She does report a 10 pound weight gain recently and exertional shortness of breath.  She reports last Friday she was nauseated and lightheaded all day.  She stopped taking her calcium as she reports sometimes it makes her sick.  Patient  also reports she feels like her heart flutters every once in a while.  She would like a full cardiac work-up.  Since that day patient has been keeping up with her blood pressure and reports the following readings:  BP 139/80, HR 66 BP 137/69, HR 72 BP 126/67, HR 70 BP 135/68, HR 69 BP 148/74, HR 59 BP 139/69, HR 73   ROS: Negative unless specifically indicated above in HPI.   Relevant past medical history reviewed and updated as indicated.   Allergies and medications reviewed and updated.   Current Outpatient Medications:  .  Ascorbic Acid (VITAMIN C ADULT GUMMIES PO), Take by mouth. Takes 2 daily, Disp: , Rfl:  .  CALCIUM PO, Take by mouth 2 (two) times daily., Disp: , Rfl:  .  Cholecalciferol (VITAMIN D3) 1000 UNITS CAPS, Take by mouth., Disp: , Rfl:  .  famotidine (PEPCID) 40 MG tablet, Take 40 mg by mouth daily., Disp: , Rfl:  .  ibuprofen (ADVIL) 600 MG tablet, Take 1 tablet (600 mg total) by mouth every 8 (eight) hours as needed for moderate pain., Disp: 30 tablet, Rfl: 1 .  pravastatin (PRAVACHOL) 40 MG tablet, TAKE 1 TABLET BY MOUTH EVERY DAY, Disp: 90 tablet, Rfl: 3  Allergies  Allergen Reactions  . Hydrocodone-Acetaminophen   . Vicodin [Hydrocodone-Acetaminophen] Nausea Only    dizziness  . Allegra-D [Fexofenadine-Pseudoephed Er] Nausea Only  . Codeine Other (See Comments)    GI upset;  dizziness    Objective:   BP (!) 172/82   Pulse 68   Temp 98.1 F (36.7 C) (Temporal)   Ht '5\' 4"'  (1.626 m)   Wt 224 lb 6.4 oz (101.8 kg)   SpO2 100%   BMI 38.52 kg/m    Physical Exam Vitals reviewed.  Constitutional:      General: She is not in acute distress.    Appearance: Normal appearance. She is obese. She is not ill-appearing, toxic-appearing or diaphoretic.  HENT:     Head: Normocephalic and atraumatic.  Eyes:     General: No scleral icterus.       Right eye: No discharge.        Left eye: No discharge.     Conjunctiva/sclera: Conjunctivae normal.   Cardiovascular:     Rate and Rhythm: Normal rate and regular rhythm.     Heart sounds: Normal heart sounds. No murmur heard. No friction rub. No gallop.   Pulmonary:     Effort: Pulmonary effort is normal. No respiratory distress.     Breath sounds: Normal breath sounds. No stridor. No wheezing, rhonchi or rales.  Musculoskeletal:        General: Normal range of motion.     Cervical back: Normal range of motion.  Skin:    General: Skin is warm and dry.     Capillary Refill: Capillary refill takes less than 2 seconds.  Neurological:     General: No focal deficit present.     Mental Status: She is alert and oriented to person, place, and time. Mental status is at baseline.  Psychiatric:        Mood and Affect: Mood normal.        Behavior: Behavior normal.        Thought Content: Thought content normal.        Judgment: Judgment normal.

## 2020-08-13 ENCOUNTER — Encounter: Payer: Self-pay | Admitting: Family Medicine

## 2020-08-13 LAB — CBC WITH DIFFERENTIAL/PLATELET
Basophils Absolute: 0 10*3/uL (ref 0.0–0.2)
Basos: 1 %
EOS (ABSOLUTE): 0.1 10*3/uL (ref 0.0–0.4)
Eos: 2 %
Hematocrit: 41 % (ref 34.0–46.6)
Hemoglobin: 13.7 g/dL (ref 11.1–15.9)
Immature Grans (Abs): 0 10*3/uL (ref 0.0–0.1)
Immature Granulocytes: 1 %
Lymphocytes Absolute: 2.5 10*3/uL (ref 0.7–3.1)
MCH: 29.1 pg (ref 26.6–33.0)
MCHC: 33.4 g/dL (ref 31.5–35.7)
MCV: 87 fL (ref 79–97)
Monocytes Absolute: 0.4 10*3/uL (ref 0.1–0.9)
Monocytes: 8 %
Neutrophils Absolute: 2.7 10*3/uL (ref 1.4–7.0)
Neutrophils: 46 %
Platelets: 273 10*3/uL (ref 150–450)
RBC: 4.71 x10E6/uL (ref 3.77–5.28)
RDW: 13.5 % (ref 11.7–15.4)
WBC: 5.8 10*3/uL (ref 3.4–10.8)

## 2020-08-13 LAB — CMP14+EGFR
ALT: 14 IU/L (ref 0–32)
AST: 17 IU/L (ref 0–40)
Albumin/Globulin Ratio: 1.8 (ref 1.2–2.2)
Albumin: 4.4 g/dL (ref 3.8–4.8)
Alkaline Phosphatase: 77 IU/L (ref 44–121)
BUN/Creatinine Ratio: 18 (ref 12–28)
BUN: 14 mg/dL (ref 8–27)
Bilirubin Total: 0.9 mg/dL (ref 0.0–1.2)
CO2: 20 mmol/L (ref 20–29)
Calcium: 9.8 mg/dL (ref 8.7–10.3)
Creatinine, Ser: 0.8 mg/dL (ref 0.57–1.00)
GFR calc Af Amer: 88 mL/min/{1.73_m2} (ref >59)
GFR calc non Af Amer: 77 mL/min/{1.73_m2} (ref >59)
Globulin, Total: 2.4 g/dL (ref 1.5–4.5)
Glucose: 85 mg/dL (ref 65–99)
Potassium: 4.2 mmol/L (ref 3.5–5.2)
Sodium: 141 mmol/L (ref 134–144)
Total Protein: 6.8 g/dL (ref 6.0–8.5)

## 2020-08-13 LAB — THYROID PANEL WITH TSH
Free Thyroxine Index: 2.4 (ref 1.2–4.9)
T3 Uptake Ratio: 25 % (ref 24–39)
T4, Total: 9.5 ug/dL (ref 4.5–12.0)
TSH: 2.69 u[IU]/mL (ref 0.450–4.500)

## 2020-08-13 LAB — MAGNESIUM: Magnesium: 1.9 mg/dL (ref 1.6–2.3)

## 2020-08-19 DIAGNOSIS — Z1283 Encounter for screening for malignant neoplasm of skin: Secondary | ICD-10-CM | POA: Diagnosis not present

## 2020-08-19 DIAGNOSIS — D225 Melanocytic nevi of trunk: Secondary | ICD-10-CM | POA: Diagnosis not present

## 2020-08-19 DIAGNOSIS — D235 Other benign neoplasm of skin of trunk: Secondary | ICD-10-CM | POA: Diagnosis not present

## 2020-08-19 DIAGNOSIS — L820 Inflamed seborrheic keratosis: Secondary | ICD-10-CM | POA: Diagnosis not present

## 2020-09-03 ENCOUNTER — Encounter: Payer: Self-pay | Admitting: Family Medicine

## 2020-09-03 ENCOUNTER — Ambulatory Visit (INDEPENDENT_AMBULATORY_CARE_PROVIDER_SITE_OTHER): Payer: Medicare HMO | Admitting: Family Medicine

## 2020-09-03 ENCOUNTER — Other Ambulatory Visit: Payer: Self-pay

## 2020-09-03 VITALS — Temp 98.0°F | Ht 64.0 in | Wt 219.5 lb

## 2020-09-03 DIAGNOSIS — F419 Anxiety disorder, unspecified: Secondary | ICD-10-CM

## 2020-09-03 DIAGNOSIS — R030 Elevated blood-pressure reading, without diagnosis of hypertension: Secondary | ICD-10-CM | POA: Diagnosis not present

## 2020-09-03 DIAGNOSIS — R42 Dizziness and giddiness: Secondary | ICD-10-CM

## 2020-09-03 DIAGNOSIS — R69 Illness, unspecified: Secondary | ICD-10-CM | POA: Diagnosis not present

## 2020-09-03 MED ORDER — HYDROXYZINE HCL 10 MG PO TABS: 10.0000 mg | ORAL_TABLET | Freq: Three times a day (TID) | ORAL | 0 refills | Status: DC | PRN

## 2020-09-03 MED ORDER — MECLIZINE HCL 12.5 MG PO TABS: 12.5000 mg | ORAL_TABLET | Freq: Three times a day (TID) | ORAL | 0 refills | Status: DC | PRN

## 2020-09-03 NOTE — Progress Notes (Signed)
Acute Office Visit  Subjective:    Patient ID: Rachael Nguyen, female    DOB: Feb 15, 1951, 70 y.o.   MRN: [id number]  Chief Complaint  Patient presents with  . Dizziness    HPI Patient is in today for dizziness that started this morning. She reports that she got out of bed quickly this morning and felt like she was dizzy "like I was drunk". She checked her BP this morning and it was elevated 140-170s/70-80. Her BP is normally 425-956 systolic. She checks it daily 5-6x a day and reports that this is the first day it has really been elevated.  Today in the office when laying down she felt dizzy. She has had a few crackers to eat this morning. Denies chest pain, shortness of breath, focal weakness, palpitations, edema, changes in vision, or vomiting this morning. She did feel a little nauseous this morning before eating.   She has been working to improve her diet and exercise. She has not been drinking water. She had an episode of chest discomfort and tachycardia about a month ago for which she was seen in our office. She had normal lab work and a normal EKG at that visit. She was referred to cardiology and has an appointment with cardiology on 3/30.  She feeling anxious this morning because she felt off. When she feels off she feels anxious, but does not have anxiety on a daily basis.  GAD 7 : Generalized Anxiety Score 09/03/2020  Nervous, Anxious, on Edge 0  Control/stop worrying 0  Worry too much - different things 0  Trouble relaxing 0  Restless 0  Easily annoyed or irritable 0  Afraid - awful might happen 2  Total GAD 7 Score 2     Past Medical History:  Diagnosis Date  . Hyperlipidemia   . Osteopenia 04/2015 T score -2.4   FRAX not calculated    Past Surgical History:  Procedure Laterality Date  . COMBINED HYSTEROSCOPY DIAGNOSTIC / D&C  2011   polyp  . DENTAL SURGERY     3 teeth extracted    Family History  Problem Relation Age of Onset  . Breast cancer Paternal  Aunt   . Hypertension Mother   . Dementia Mother   . Leukemia Father   . Colon cancer Neg Hx   . Esophageal cancer Neg Hx   . Pancreatic cancer Neg Hx   . Rectal cancer Neg Hx   . Stomach cancer Neg Hx   . Ulcerative colitis Neg Hx   . Crohn's disease Neg Hx     Social History   Socioeconomic History  . Marital status: Married    Spouse name: Not on file  . Number of children: 0  . Years of education: Not on file  . Highest education level: High school graduate  Occupational History  . Occupation: Retired  Tobacco Use  . Smoking status: Never Smoker  . Smokeless tobacco: Never Used  Vaping Use  . Vaping Use: Never used  Substance and Sexual Activity  . Alcohol use: No    Alcohol/week: 0.0 standard drinks  . Drug use: No  . Sexual activity: Yes  Other Topics Concern  . Not on file  Social History Narrative  . Not on file   Social Determinants of Health   Financial Resource Strain: Not on file  Food Insecurity: Not on file  Transportation Needs: Not on file  Physical Activity: Not on file  Stress: Not on file  Social Connections:  Not on file  Intimate Partner Violence: Not on file    Outpatient Medications Prior to Visit  Medication Sig Dispense Refill  . Ascorbic Acid (VITAMIN C ADULT GUMMIES PO) Take by mouth. Takes 2 daily    . Cholecalciferol (VITAMIN D3) 1000 UNITS CAPS Take by mouth.    . famotidine (PEPCID) 40 MG tablet Take 40 mg by mouth daily.    Marland Kitchen ibuprofen (ADVIL) 600 MG tablet Take 1 tablet (600 mg total) by mouth every 8 (eight) hours as needed for moderate pain. 30 tablet 1  . pravastatin (PRAVACHOL) 40 MG tablet TAKE 1 TABLET BY MOUTH EVERY DAY 90 tablet 3  . CALCIUM PO Take by mouth 2 (two) times daily. (Patient not taking: Reported on 09/03/2020)     No facility-administered medications prior to visit.    Allergies  Allergen Reactions  . Hydrocodone-Acetaminophen   . Vicodin [Hydrocodone-Acetaminophen] Nausea Only    dizziness  .  Allegra-D [Fexofenadine-Pseudoephed Er] Nausea Only  . Codeine Other (See Comments)    GI upset; dizziness    Review of Systems As per HPI.     Objective:    Physical Exam Vitals and nursing note reviewed.  Constitutional:      General: She is not in acute distress.    Appearance: Normal appearance. She is not ill-appearing, toxic-appearing or diaphoretic.  HENT:     Head: Normocephalic and atraumatic.  Eyes:     Extraocular Movements: Extraocular movements intact.     Conjunctiva/sclera: Conjunctivae normal.     Pupils: Pupils are equal, round, and reactive to light.  Neck:     Vascular: No JVD.  Cardiovascular:     Rate and Rhythm: Normal rate and regular rhythm.     Heart sounds: Normal heart sounds. No murmur heard.   Pulmonary:     Effort: Pulmonary effort is normal. No respiratory distress.     Breath sounds: Normal breath sounds.  Abdominal:     General: Bowel sounds are normal. There is no distension.     Palpations: Abdomen is soft.     Tenderness: There is no abdominal tenderness.  Musculoskeletal:     Cervical back: Neck supple. No tenderness.     Right lower leg: No edema.     Left lower leg: No edema.  Skin:    General: Skin is warm and dry.  Neurological:     General: No focal deficit present.     Mental Status: She is alert and oriented to person, place, and time.     Motor: No weakness.     Gait: Gait normal.  Psychiatric:        Mood and Affect: Mood normal.        Thought Content: Thought content normal.        Judgment: Judgment normal.     Temp 98 F (36.7 C) (Temporal)   Ht 5\' 4"  (1.626 m)   Wt 219 lb 8 oz (99.6 kg)   BMI 37.68 kg/m  Wt Readings from Last 3 Encounters:  09/03/20 219 lb 8 oz (99.6 kg)  08/12/20 224 lb 6.4 oz (101.8 kg)  02/11/20 225 lb (102.1 kg)    Health Maintenance Due  Topic Date Due  . COVID-19 Vaccine (3 - Booster for Moderna series) 03/11/2020    There are no preventive care reminders to display for this  patient.   Lab Results  Component Value Date   TSH 2.690 08/12/2020   Lab Results  Component Value Date  WBC 5.8 08/12/2020   HGB 13.7 08/12/2020   HCT 41.0 08/12/2020   MCV 87 08/12/2020   PLT 273 08/12/2020   Lab Results  Component Value Date   NA 141 08/12/2020   K 4.2 08/12/2020   CO2 20 08/12/2020   GLUCOSE 85 08/12/2020   BUN 14 08/12/2020   CREATININE 0.80 08/12/2020   BILITOT 0.9 08/12/2020   ALKPHOS 77 08/12/2020   AST 17 08/12/2020   ALT 14 08/12/2020   PROT 6.8 08/12/2020   ALBUMIN 4.4 08/12/2020   CALCIUM 9.8 08/12/2020   Lab Results  Component Value Date   CHOL 159 02/11/2020   Lab Results  Component Value Date   HDL 64 02/11/2020   Lab Results  Component Value Date   LDLCALC 82 02/11/2020   Lab Results  Component Value Date   TRIG 69 02/11/2020   Lab Results  Component Value Date   CHOLHDL 2.5 02/11/2020   Lab Results  Component Value Date   HGBA1C 5.6 02/11/2020       Assessment & Plan:   Lynnmarie was seen today for dizziness.  Diagnoses and all orders for this visit:  Elevated blood pressure reading without diagnosis of hypertension She checks her BP at home daily and today is the first time it has been elevated at home. 141/77 today in office. She has had a recent normal EKG and has a cardiology appointment scheduled on 3/30. She will continue to check BP at home and notify provider for persistent elevated readings. Discussed that elevated BP today could be due to increased anxiety.   Anxiety Try hydroxyzine for increased anxiety. GAD 7 score of 2 today.  -     hydrOXYzine (ATARAX/VISTARIL) 10 MG tablet; Take 1 tablet (10 mg total) by mouth 3 (three) times daily as needed.  Dizziness ? Vertigo. Orthostatic BPs were negative today in the office. Try meclizine.  -     meclizine (ANTIVERT) 12.5 MG tablet; Take 1 tablet (12.5 mg total) by mouth 3 (three) times daily as needed for dizziness.  Return to office for new or worsening  symptoms, or if symptoms persist.    Gwenlyn Perking, FNP

## 2020-09-03 NOTE — Patient Instructions (Signed)
Vertigo Vertigo is the feeling that you or your surroundings are moving when they are not. This feeling can come and go at any time. Vertigo often goes away on its own. Vertigo can be dangerous if it occurs while you are doing something that could endanger you or others, such as driving or operating machinery. Your health care provider will do tests to determine the cause of your vertigo. Tests will also help your health care provider decide how best to treat your condition. Follow these instructions at home: Eating and drinking  Drink enough fluid to keep your urine pale yellow.  Do not drink alcohol.      Activity  Return to your normal activities as told by your health care provider. Ask your health care provider what activities are safe for you.  In the morning, first sit up on the side of the bed. When you feel okay, stand slowly while you hold onto something until you know that your balance is fine.  Move slowly. Avoid sudden body or head movements or certain positions, as told by your health care provider.  If you have trouble walking or keeping your balance, try using a cane for stability. If you feel dizzy or unstable, sit down right away.  Avoid doing any tasks that would cause danger to you or others if vertigo occurs.  Avoid bending down if you feel dizzy. Place items in your home so that they are easy for you to reach without leaning over.  Do not drive or use heavy machinery if you feel dizzy. General instructions  Take over-the-counter and prescription medicines only as told by your health care provider.  Keep all follow-up visits as told by your health care provider. This is important. Contact a health care provider if:  Your medicines do not relieve your vertigo or they make it worse.  You have a fever.  Your condition gets worse or you develop new symptoms.  Your family or friends notice any behavioral changes.  Your nausea or vomiting gets worse.  You  have numbness or a prickling and tingling sensation in part of your body. Get help right away if you:  Have difficulty moving or speaking.  Are always dizzy.  Faint.  Develop severe headaches.  Have weakness in your hands, arms, or legs.  Have changes in your hearing or vision.  Develop a stiff neck.  Develop sensitivity to light. Summary  Vertigo is the feeling that you or your surroundings are moving when they are not.  Your health care provider will do tests to determine the cause of your vertigo.  Follow instructions for home care. You may be told to avoid certain tasks, positions, or movements.  Contact a health care provider if your medicines do not relieve your symptoms, or if you have a fever, nausea, vomiting, or changes in behavior.  Get help right away if you have severe headaches or difficulty speaking, or you develop hearing or vision problems. This information is not intended to replace advice given to you by your health care provider. Make sure you discuss any questions you have with your health care provider. Document Revised: 05/14/2018 Document Reviewed: 05/14/2018 Elsevier Patient Education  2021 [employer].

## 2020-09-29 DIAGNOSIS — R072 Precordial pain: Secondary | ICD-10-CM | POA: Insufficient documentation

## 2020-09-29 DIAGNOSIS — R000 Tachycardia, unspecified: Secondary | ICD-10-CM | POA: Insufficient documentation

## 2020-09-29 NOTE — Progress Notes (Signed)
Cardiology Office Note   Date:  09/30/2020   ID:  Rachael Nguyen, Rachael Nguyen 03/20/1951, MRN [id number]  PCP:  [name], DO  Cardiologist:   No primary care provider on file. Referring:  [name], DO  Chief Complaint  Patient presents with  . Chest Pain      History of Present Illness: Rachael Nguyen is a 70 y.o. female who is referred for evaluation of chest pain and tachycardia.  She has no past cardiac history.  She has no recent symptoms.  On 2 AM on 2/7 she awoke with some chest discomfort.  It was a dull heavy discomfort but it was 2 out of 10 in intensity.  However, her heart rate started to go up.  Her husband was recording it on pulse ox and went to 149.  She said she called EMS and did not want transportation.  She saw her primary provider couple of days later.  There was an EKG which I am reviewing today demonstrates left bundle branch block.  However, there were no other acute abnormalities.  She did mention apparently at the time EMS arrived that there was some high blood pressure but she brings me a blood pressure diary.  Blood pressures are between 110s to 140s with probably average at target therapy.  She has not had a tachycardia that she has at night.  She has since then felt well.  She is not having any chest pressure, neck or arm discomfort.  She is not having any shortness of breath, PND or orthopnea.  She does yard work and other activities.  She does not recall having an EKG before and has never been told about left bundle branch block.  Past Medical History:  Diagnosis Date  . Hyperlipidemia   . Osteopenia 04/2015 T score -2.4   FRAX not calculated    Past Surgical History:  Procedure Laterality Date  . COMBINED HYSTEROSCOPY DIAGNOSTIC / D&C  2011   polyp  . DENTAL SURGERY     3 teeth extracted     Current Outpatient Medications  Medication Sig Dispense Refill  . Ascorbic Acid (VITAMIN C ADULT GUMMIES PO) Take by mouth. Takes 2  daily    . Cholecalciferol (VITAMIN D3) 1000 UNITS CAPS Take by mouth.    . famotidine (PEPCID) 40 MG tablet Take 40 mg by mouth daily.    . hydrOXYzine (ATARAX/VISTARIL) 10 MG tablet Take 1 tablet (10 mg total) by mouth 3 (three) times daily as needed. 30 tablet 0  . ibuprofen (ADVIL) 600 MG tablet Take 1 tablet (600 mg total) by mouth every 8 (eight) hours as needed for moderate pain. 30 tablet 1  . meclizine (ANTIVERT) 12.5 MG tablet Take 1 tablet (12.5 mg total) by mouth 3 (three) times daily as needed for dizziness. 30 tablet 0  . pravastatin (PRAVACHOL) 40 MG tablet TAKE 1 TABLET BY MOUTH EVERY DAY 90 tablet 3  . CALCIUM PO Take by mouth 2 (two) times daily. (Patient not taking: No sig reported)     No current facility-administered medications for this visit.    Allergies:   Hydrocodone-acetaminophen, Vicodin [hydrocodone-acetaminophen], Allegra-d [fexofenadine-pseudoephed er], and Codeine    Social History:  The patient  reports that she has never smoked. She has never used smokeless tobacco. She reports that she does not drink alcohol and does not use drugs.   Family History:  The patient's family history includes Breast cancer in her paternal aunt; Dementia in  her mother; Hypertension in her mother; Leukemia in her father.    ROS:  Please see the history of present illness.   Otherwise, review of systems are positive for none.   All other systems are reviewed and negative.    PHYSICAL EXAM: VS:  BP (!) 158/100   Pulse 76   Ht 5\' 4"  (1.626 m)   Wt 216 lb (98 kg)   BMI 37.08 kg/m  , BMI Body mass index is 37.08 kg/m. GENERAL:  Well appearing HEENT:  Pupils equal round and reactive, fundi not visualized, oral mucosa unremarkable NECK:  No jugular venous distention, waveform within normal limits, carotid upstroke brisk and symmetric, no bruits, no thyromegaly LYMPHATICS:  No cervical, inguinal adenopathy LUNGS:  Clear to auscultation bilaterally BACK:  No CVA  tenderness CHEST:  Unremarkable HEART:  PMI not displaced or sustained,S1 and S2 within normal limits, no S3, no S4, no clicks, no rubs, no murmurs ABD:  Flat, positive bowel sounds normal in frequency in pitch, no bruits, no rebound, no guarding, no midline pulsatile mass, no hepatomegaly, no splenomegaly EXT:  2 plus pulses throughout, no edema, no cyanosis no clubbing SKIN:  No rashes no nodules NEURO:  Cranial nerves II through XII grossly intact, motor grossly intact throughout PSYCH:  Cognitively intact, oriented to person place and time    EKG:  EKG is not ordered today. The ekg ordered 08/12/2020 demonstrates sinus rhythm, rate 62, left bundle branch block   Recent Labs: 08/12/2020: ALT 14; BUN 14; Creatinine, Ser 0.80; Hemoglobin 13.7; Magnesium 1.9; Platelets 273; Potassium 4.2; Sodium 141; TSH 2.690    Lipid Panel    Component Value Date/Time   CHOL 159 02/11/2020 0921   TRIG 69 02/11/2020 0921   HDL 64 02/11/2020 0921   CHOLHDL 2.5 02/11/2020 0921   LDLCALC 82 02/11/2020 0921      Wt Readings from Last 3 Encounters:  09/30/20 216 lb (98 kg)  09/03/20 219 lb 8 oz (99.6 kg)  08/12/20 224 lb 6.4 oz (101.8 kg)      Other studies Reviewed: Additional studies/ records that were reviewed today include: Labs, EKG. Review of the above records demonstrates:  Please see elsewhere in the note.     ASSESSMENT AND PLAN:  CHEST PAIN:    Her chest pain sounds predominantly nonanginal but she also now has an abnormal EKG.  She does not have significant risk factors but with the EKG I need to make sure she has a structurally normal heart and then decide on testing to exclude obstructive coronary disease.  I am going to start with an echocardiogram.  If there are no wall motion abnormalities I will likely follow-up with perfusion study.  If there are wall motion abnormalities she might need cardiac cath or coronary CTA.  TACHYCARDIA: This has resolved.  She will follow this with  her device.  BORDERLINE HTN: I reviewed the blood pressure diary as it would not suggest changing medications at this point.  This can be followed as she increases her activity and loses some of the weight that she says she gained recently.  Current medicines are reviewed at length with the patient today.  The patient does not have concerns regarding medicines.  The following changes have been made:  no change  Labs/ tests ordered today include:   Orders Placed This Encounter  Procedures  . ECHOCARDIOGRAM COMPLETE     Disposition:   FU with me based on the results of the above  Signed, Minus Breeding, MD  09/30/2020 1:35 PM    [facility]

## 2020-09-30 ENCOUNTER — Ambulatory Visit: Payer: Medicare HMO | Admitting: Cardiology

## 2020-09-30 ENCOUNTER — Encounter: Payer: Self-pay | Admitting: Cardiology

## 2020-09-30 ENCOUNTER — Other Ambulatory Visit: Payer: Self-pay

## 2020-09-30 VITALS — BP 158/100 | HR 76 | Ht 64.0 in | Wt 216.0 lb

## 2020-09-30 DIAGNOSIS — I447 Left bundle-branch block, unspecified: Secondary | ICD-10-CM

## 2020-09-30 DIAGNOSIS — R072 Precordial pain: Secondary | ICD-10-CM | POA: Diagnosis not present

## 2020-09-30 DIAGNOSIS — R000 Tachycardia, unspecified: Secondary | ICD-10-CM

## 2020-09-30 NOTE — Patient Instructions (Addendum)
Medication Instructions:  The current medical regimen is effective;  continue present plan and medications.  *If you need a refill on your cardiac medications before your next appointment, please call your pharmacy*  Testing/Procedures: Your physician has requested that you have an echocardiogram. Echocardiography is a painless test that uses sound waves to create images of your heart. It provides your doctor with information about the size and shape of your heart and how well your heart's chambers and valves are working. This procedure takes approximately one hour. There are no restrictions for this procedure.  Follow-Up: At [facility], you and your health needs are our priority.  As part of our continuing mission to provide you with exceptional heart care, we have created designated Provider Care Teams.  These Care Teams include your primary Cardiologist (physician) and Advanced Practice Providers (APPs -  Physician Assistants and Nurse Practitioners) who all work together to provide you with the care you need, when you need it.  We recommend signing up for the patient portal called "MyChart".  Sign up information is provided on this After Visit Summary.  MyChart is used to connect with patients for Virtual Visits (Telemedicine).  Patients are able to view lab/test results, encounter notes, upcoming appointments, etc.  Non-urgent messages can be sent to your provider as well.   To learn more about what you can do with MyChart, go to [email or web address].    Your next appointment:   Follow up will be based on the results of the above testing.  Thank you for choosing [facility]!!    Left bundle branch block   [email or web address]">  DASH Eating Plan DASH stands for Dietary Approaches to Stop Hypertension. The DASH eating plan is a healthy eating plan that has been shown to:  Reduce high blood pressure (hypertension).  Reduce  your risk for type 2 diabetes, heart disease, and stroke.  Help with weight loss. What are tips for following this plan? Reading food labels  Check food labels for the amount of salt (sodium) per serving. Choose foods with less than 5 percent of the Daily Value of sodium. Generally, foods with less than 300 milligrams (mg) of sodium per serving fit into this eating plan.  To find whole grains, look for the word "whole" as the first word in the ingredient list. Shopping  Buy products labeled as "low-sodium" or "no salt added."  Buy fresh foods. Avoid canned foods and pre-made or frozen meals. Cooking  Avoid adding salt when cooking. Use salt-free seasonings or herbs instead of table salt or sea salt. Check with your health care provider or pharmacist before using salt substitutes.  Do not fry foods. Cook foods using healthy methods such as baking, boiling, grilling, roasting, and broiling instead.  Cook with heart-healthy oils, such as olive, canola, avocado, soybean, or sunflower oil. Meal planning  Eat a balanced diet that includes: ? 4 or more servings of fruits and 4 or more servings of vegetables each day. Try to fill one-half of your plate with fruits and vegetables. ? 6-8 servings of whole grains each day. ? Less than 6 oz (170 g) of lean meat, poultry, or fish each day. A 3-oz (85-g) serving of meat is about the same size as a deck of cards. One egg equals 1 oz (28 g). ? 2-3 servings of low-fat dairy each day. One serving is 1 cup (237 mL). ? 1 serving of nuts, seeds, or beans 5 times each week. ? 2-3 servings of heart-healthy fats.  Healthy fats called omega-3 fatty acids are found in foods such as walnuts, flaxseeds, fortified milks, and eggs. These fats are also found in cold-water fish, such as sardines, salmon, and mackerel.  Limit how much you eat of: ? Canned or prepackaged foods. ? Food that is high in trans fat, such as some fried foods. ? Food that is high in  saturated fat, such as fatty meat. ? Desserts and other sweets, sugary drinks, and other foods with added sugar. ? Full-fat dairy products.  Do not salt foods before eating.  Do not eat more than 4 egg yolks a week.  Try to eat at least 2 vegetarian meals a week.  Eat more home-cooked food and less restaurant, buffet, and fast food.   Lifestyle  When eating at a restaurant, ask that your food be prepared with less salt or no salt, if possible.  If you drink alcohol: ? Limit how much you use to:  0-1 drink a day for women who are not pregnant.  0-2 drinks a day for men. ? Be aware of how much alcohol is in your drink. In the [place] one drink equals one 12 oz bottle of beer (355 mL), one 5 oz glass of wine (148 mL), or one 1 oz glass of hard liquor (44 mL). General information  Avoid eating more than 2,300 mg of salt a day. If you have hypertension, you may need to reduce your sodium intake to 1,500 mg a day.  Work with your health care provider to maintain a healthy body weight or to lose weight. Ask what an ideal weight is for you.  Get at least 30 minutes of exercise that causes your heart to beat faster (aerobic exercise) most days of the week. Activities may include walking, swimming, or biking.  Work with your health care provider or dietitian to adjust your eating plan to your individual calorie needs. What foods should I eat? Fruits All fresh, dried, or frozen fruit. Canned fruit in natural juice (without added sugar). Vegetables Fresh or frozen vegetables (raw, steamed, roasted, or grilled). Low-sodium or reduced-sodium tomato and vegetable juice. Low-sodium or reduced-sodium tomato sauce and tomato paste. Low-sodium or reduced-sodium canned vegetables. Grains Whole-grain or whole-wheat bread. Whole-grain or whole-wheat pasta. Brown rice. [place]. Bulgur. Whole-grain and low-sodium cereals. Pita bread. Low-fat, low-sodium crackers. Whole-wheat flour  tortillas. Meats and other proteins Skinless chicken or [place]. Ground chicken or [place]. Pork with fat trimmed off. Fish and seafood. Egg whites. Dried beans, peas, or lentils. Unsalted nuts, nut butters, and seeds. Unsalted canned beans. Lean cuts of beef with fat trimmed off. Low-sodium, lean precooked or cured meat, such as sausages or meat loaves. Dairy Low-fat (1%) or fat-free (skim) milk. Reduced-fat, low-fat, or fat-free cheeses. Nonfat, low-sodium ricotta or cottage cheese. Low-fat or nonfat yogurt. Low-fat, low-sodium cheese. Fats and oils Soft margarine without trans fats. Vegetable oil. Reduced-fat, low-fat, or light mayonnaise and salad dressings (reduced-sodium). Canola, safflower, olive, avocado, soybean, and sunflower oils. Avocado. Seasonings and condiments Herbs. Spices. Seasoning mixes without salt. Other foods Unsalted popcorn and pretzels. Fat-free sweets. The items listed above may not be a complete list of foods and beverages you can eat. Contact a dietitian for more information. What foods should I avoid? Fruits Canned fruit in a light or heavy syrup. Fried fruit. Fruit in cream or butter sauce. Vegetables Creamed or fried vegetables. Vegetables in a cheese sauce. Regular canned vegetables (not low-sodium or reduced-sodium). Regular canned tomato sauce and paste (not low-sodium  or reduced-sodium). Regular tomato and vegetable juice (not low-sodium or reduced-sodium). Angie Fava. Olives. Grains Baked goods made with fat, such as croissants, muffins, or some breads. Dry pasta or rice meal packs. Meats and other proteins Fatty cuts of meat. Ribs. Fried meat. Bernie[place] Salines. Bologna, salami, and other precooked or cured meats, such as sausages or meat loaves. Fat from the back of a pig (fatback). Bratwurst. Salted nuts and seeds. Canned beans with added salt. Canned or smoked fish. Whole eggs or egg yolks. Chicken or [place] with skin. Dairy Whole or 2% milk, cream, and half-and-half.  Whole or full-fat cream cheese. Whole-fat or sweetened yogurt. Full-fat cheese. Nondairy creamers. Whipped toppings. Processed cheese and cheese spreads. Fats and oils Butter. Stick margarine. Lard. Shortening. Ghee. Bacon fat. Tropical oils, such as coconut, palm kernel, or palm oil. Seasonings and condiments Onion salt, garlic salt, seasoned salt, table salt, and sea salt. Worcestershire sauce. Tartar sauce. Barbecue sauce. Teriyaki sauce. Soy sauce, including reduced-sodium. Steak sauce. Canned and packaged gravies. Fish sauce. Oyster sauce. Cocktail sauce. Store-bought horseradish. Ketchup. Mustard. Meat flavorings and tenderizers. Bouillon cubes. Hot sauces. Pre-made or packaged marinades. Pre-made or packaged taco seasonings. Relishes. Regular salad dressings. Other foods Salted popcorn and pretzels. The items listed above may not be a complete list of foods and beverages you should avoid. Contact a dietitian for more information. Where to find more information  National Heart, Lung, and Blood Institute: https://wilson-eaton.com/  American Heart Association: www.heart.org  Academy of Nutrition and Dietetics: www.eatright.[place]: www.kidney.org Summary  The DASH eating plan is a healthy eating plan that has been shown to reduce high blood pressure (hypertension). It may also reduce your risk for type 2 diabetes, heart disease, and stroke.  When on the DASH eating plan, aim to eat more fresh fruits and vegetables, whole grains, lean proteins, low-fat dairy, and heart-healthy fats.  With the DASH eating plan, you should limit salt (sodium) intake to 2,300 mg a day. If you have hypertension, you may need to reduce your sodium intake to 1,500 mg a day.  Work with your health care provider or dietitian to adjust your eating plan to your individual calorie needs. This information is not intended to replace advice given to you by your health care provider. Make sure you  discuss any questions you have with your health care provider. Document Revised: 05/24/2019 Document Reviewed: 05/24/2019 Elsevier Patient Education  2021 [place] refers to food and lifestyle choices that are based on the traditions of countries located on the [employer]. This way of eating has been shown to help prevent certain conditions and improve outcomes for people who have chronic diseases, like kidney disease and heart disease. What are tips for following this plan? Lifestyle  Cook and eat meals together with your family, when possible.  Drink enough fluid to keep your urine clear or pale yellow.  Be physically active every day. This includes: ? Aerobic exercise like running or swimming. ? Leisure activities like gardening, walking, or housework.  Get 7-8 hours of sleep each night.  If recommended by your health care provider, drink red wine in moderation. This means 1 glass a day for nonpregnant women and 2 glasses a day for men. A glass of wine equals 5 oz (150 mL). Reading food labels  Check the serving size of packaged foods. For foods such as rice and pasta, the serving size refers to the amount of cooked product, not  dry.  Check the total fat in packaged foods. Avoid foods that have saturated fat or trans fats.  Check the ingredients list for added sugars, such as corn syrup.   Shopping  At the grocery store, buy most of your food from the areas near the walls of the store. This includes: ? Fresh fruits and vegetables (produce). ? Grains, beans, nuts, and seeds. Some of these may be available in unpackaged forms or large amounts (in bulk). ? Fresh seafood. ? Poultry and eggs. ? Low-fat dairy products.  Buy whole ingredients instead of prepackaged foods.  Buy fresh fruits and vegetables in-season from local farmers markets.  Buy frozen fruits and vegetables in resealable bags.  If you do not have access  to quality fresh seafood, buy precooked frozen shrimp or canned fish, such as tuna, salmon, or sardines.  Buy small amounts of raw or cooked vegetables, salads, or olives from the deli or salad bar at your store.  Stock your pantry so you always have certain foods on hand, such as olive oil, canned tuna, canned tomatoes, rice, pasta, and beans. Cooking  Cook foods with extra-virgin olive oil instead of using butter or other vegetable oils.  Have meat as a side dish, and have vegetables or grains as your main dish. This means having meat in small portions or adding small amounts of meat to foods like pasta or stew.  Use beans or vegetables instead of meat in common dishes like chili or lasagna.  Experiment with different cooking methods. Try roasting or broiling vegetables instead of steaming or sauteing them.  Add frozen vegetables to soups, stews, pasta, or rice.  Add nuts or seeds for added healthy fat at each meal. You can add these to yogurt, salads, or vegetable dishes.  Marinate fish or vegetables using olive oil, lemon juice, garlic, and fresh herbs. Meal planning  Plan to eat 1 vegetarian meal one day each week. Try to work up to 2 vegetarian meals, if possible.  Eat seafood 2 or more times a week.  Have healthy snacks readily available, such as: ? Vegetable sticks with hummus. ? [place] yogurt. ? Fruit and nut trail mix.  Eat balanced meals throughout the week. This includes: ? Fruit: 2-3 servings a day ? Vegetables: 4-5 servings a day ? Low-fat dairy: 2 servings a day ? Fish, poultry, or lean meat: 1 serving a day ? Beans and legumes: 2 or more servings a week ? Nuts and seeds: 1-2 servings a day ? Whole grains: 6-8 servings a day ? Extra-virgin olive oil: 3-4 servings a day  Limit red meat and sweets to only a few servings a month   What are my food choices?  Mediterranean diet ? Recommended  Grains: Whole-grain pasta. Brown rice. Bulgar wheat. Polenta.  Couscous. Whole-wheat bread. [place].  Vegetables: Artichokes. Beets. Broccoli. Cabbage. Carrots. Eggplant. Green beans. Chard. Kale. Spinach. Onions. Leeks. Peas. Squash. Tomatoes. Peppers. Radishes.  Fruits: Apples. Apricots. Avocado. Berries. Bananas. Cherries. Dates. Figs. Grapes. Lemons. Melon. Oranges. Peaches. Plums. Pomegranate.  Meats and other protein foods: Beans. Almonds. Sunflower seeds. Pine nuts. Peanuts. [place]. Salmon. Scallops. Shrimp. [place]. Tilapia. Clams. Oysters. Eggs.  Dairy: Low-fat milk. Cheese. Greek yogurt.  Beverages: Water. Red wine. Herbal tea.  Fats and oils: Extra virgin olive oil. Avocado oil. Grape seed oil.  Sweets and desserts: [place] yogurt with honey. Baked apples. Poached pears. Trail mix.  Seasoning and other foods: Basil. Cilantro. Coriander. Cumin. Mint. Parsley. Sage. Rosemary. Tarragon. Garlic. Oregano. Thyme.  Pepper. Balsalmic vinegar. Tahini. Hummus. Tomato sauce. Olives. Mushrooms. ? Limit these  Grains: Prepackaged pasta or rice dishes. Prepackaged cereal with added sugar.  Vegetables: Deep fried potatoes (french fries).  Fruits: Fruit canned in syrup.  Meats and other protein foods: Beef. Pork. Lamb. Poultry with skin. Hot dogs. Berniece Salines.  Dairy: Ice cream. Sour cream. Whole milk.  Beverages: Juice. Sugar-sweetened soft drinks. Beer. Liquor and spirits.  Fats and oils: Butter. Canola oil. Vegetable oil. Beef fat (tallow). Lard.  Sweets and desserts: Cookies. Cakes. Pies. Candy.  Seasoning and other foods: Mayonnaise. Premade sauces and marinades. The items listed may not be a complete list. Talk with your dietitian about what dietary choices are right for you. Summary  The Mediterranean diet includes both food and lifestyle choices.  Eat a variety of fresh fruits and vegetables, beans, nuts, seeds, and whole grains.  Limit the amount of red meat and sweets that you eat.  Talk with your health care provider about whether it  is safe for you to drink red wine in moderation. This means 1 glass a day for nonpregnant women and 2 glasses a day for men. A glass of wine equals 5 oz (150 mL). This information is not intended to replace advice given to you by your health care provider. Make sure you discuss any questions you have with your health care provider. Document Revised: 02/18/2016 Document Reviewed: 02/11/2016 Elsevier Patient Education  [place].

## 2020-10-15 ENCOUNTER — Ambulatory Visit (INDEPENDENT_AMBULATORY_CARE_PROVIDER_SITE_OTHER): Payer: Medicare HMO

## 2020-10-15 VITALS — Wt 213.0 lb

## 2020-10-15 DIAGNOSIS — Z0000 Encounter for general adult medical examination without abnormal findings: Secondary | ICD-10-CM | POA: Diagnosis not present

## 2020-10-15 NOTE — Progress Notes (Signed)
Subjective:   Sofi Bryars is a 70 y.o. female who presents for Medicare Annual (Subsequent) preventive examination.  Virtual Visit via Telephone Note  I connected with  Lashawndra Lampkins on 10/15/20 at 11:15 AM EDT by telephone and verified that I am speaking with the correct person using two identifiers.  Location: Patient: Home Provider: [facility] Persons participating in the virtual visit: patient/Nurse Health Advisor   I discussed the limitations, risks, security and privacy concerns of performing an evaluation and management service by telephone and the availability of in person appointments. The patient expressed understanding and agreed to proceed.  Interactive audio and video telecommunications were attempted between this nurse and patient, however failed, due to patient having technical difficulties OR patient did not have access to video capability.  We continued and completed visit with audio only.  Some vital signs may be absent or patient reported.   Laekyn Rayos E Eryn Krejci, LPN   Review of Systems     Cardiac Risk Factors include: advanced age (>48men, >45 women);dyslipidemia;obesity (BMI >30kg/m2)     Objective:    Today's Vitals   10/15/20 1102  Weight: 213 lb (96.6 kg)   Body mass index is 36.56 kg/m.  Advanced Directives 10/15/2020 10/15/2019 02/25/2019 01/28/2015  Does Patient Have a Medical Advance Directive? No No No No  Does patient want to make changes to medical advance directive? Yes (MAU/Ambulatory/Procedural Areas - Information given) - - -  Would patient like information on creating a medical advance directive? No - Patient declined Yes (ED - Information included in AVS) - No - patient declined information    Current Medications (verified) Outpatient Encounter Medications as of 10/15/2020  Medication Sig  . Ascorbic Acid (VITAMIN C ADULT GUMMIES PO) Take by mouth. Takes 2 daily  . CALCIUM PO Take by mouth 2 (two) times daily.  . Cholecalciferol  (VITAMIN D3) 1000 UNITS CAPS Take by mouth.  . famotidine (PEPCID) 40 MG tablet Take 40 mg by mouth daily.  Marland Kitchen ibuprofen (ADVIL) 600 MG tablet Take 1 tablet (600 mg total) by mouth every 8 (eight) hours as needed for moderate pain.  . pravastatin (PRAVACHOL) 40 MG tablet TAKE 1 TABLET BY MOUTH EVERY DAY  . hydrOXYzine (ATARAX/VISTARIL) 10 MG tablet Take 1 tablet (10 mg total) by mouth 3 (three) times daily as needed. (Patient not taking: Reported on 10/15/2020)  . meclizine (ANTIVERT) 12.5 MG tablet Take 1 tablet (12.5 mg total) by mouth 3 (three) times daily as needed for dizziness. (Patient not taking: Reported on 10/15/2020)   No facility-administered encounter medications on file as of 10/15/2020.    Allergies (verified) Hydrocodone-acetaminophen, Vicodin [hydrocodone-acetaminophen], Allegra-d [fexofenadine-pseudoephed er], and Codeine   History: Past Medical History:  Diagnosis Date  . Hyperlipidemia   . Osteopenia 04/2015 T score -2.4   FRAX not calculated   Past Surgical History:  Procedure Laterality Date  . COMBINED HYSTEROSCOPY DIAGNOSTIC / D&C  2011   polyp  . DENTAL SURGERY     3 teeth extracted   Family History  Problem Relation Age of Onset  . Breast cancer Paternal Aunt   . Hypertension Mother   . Dementia Mother   . Leukemia Father   . Colon cancer Neg Hx   . Esophageal cancer Neg Hx   . Pancreatic cancer Neg Hx   . Rectal cancer Neg Hx   . Stomach cancer Neg Hx   . Ulcerative colitis Neg Hx   . Crohn's disease Neg Hx  Social History   Socioeconomic History  . Marital status: Married    Spouse name: Not on file  . Number of children: 0  . Years of education: Not on file  . Highest education level: High school graduate  Occupational History  . Occupation: Retired  Tobacco Use  . Smoking status: Never Smoker  . Smokeless tobacco: Never Used  Vaping Use  . Vaping Use: Never used  Substance and Sexual Activity  . Alcohol use: No    Alcohol/week: 0.0  standard drinks  . Drug use: No  . Sexual activity: Yes  Other Topics Concern  . Not on file  Social History Narrative   Married.     Social Determinants of Health   Financial Resource Strain: Not on file  Food Insecurity: Not on file  Transportation Needs: Not on file  Physical Activity: Insufficiently Active  . Days of Exercise per Week: 3 days  . Minutes of Exercise per Session: 30 min  Stress: No Stress Concern Present  . Feeling of Stress : Not at all  Social Connections: Socially Integrated  . Frequency of Communication with Friends and Family: More than three times a week  . Frequency of Social Gatherings with Friends and Family: Three times a week  . Attends Religious Services: More than 4 times per year  . Active Member of Clubs or Organizations: Yes  . Attends Archivist Meetings: More than 4 times per year  . Marital Status: Married    Tobacco Counseling Counseling given: No   Clinical Intake:  Pre-visit preparation completed: Yes  Pain : No/denies pain     BMI - recorded: 36.56 Nutritional Status: BMI > 30  Obese Nutritional Risks: None Diabetes: No  How often do you need to have someone help you when you read instructions, pamphlets, or other written materials from your doctor or pharmacy?: 1 - Never  Diabetic? No  Interpreter Needed?: No  Information entered by :: Nevin Grizzle, LPN   Activities of Daily Living In your present state of health, do you have any difficulty performing the following activities: 10/15/2020  Hearing? N  Vision? N  Difficulty concentrating or making decisions? N  Walking or climbing stairs? N  Dressing or bathing? N  Doing errands, shopping? N  Preparing Food and eating ? N  Using the Toilet? N  In the past six months, have you accidently leaked urine? N  Do you have problems with loss of bowel control? N  Managing your Medications? N  Managing your Finances? N  Housekeeping or managing your  Housekeeping? N  Some recent data might be hidden    Patient Care Team: [name], DO as PCP - General (Family Medicine)  Indicate any recent Medical Services you may have received from other than [facility] providers in the past year (date may be approximate).     Assessment:   This is a routine wellness examination for [place].  Hearing/Vision screen  Hearing Screening   125Hz  250Hz  500Hz  1000Hz  2000Hz  3000Hz  4000Hz  6000Hz  8000Hz   Right ear:           Left ear:           Comments: Denies hearing difficulty  Vision Screening Comments: Behind on eye exams with [facility] in [place]  Dietary issues and exercise activities discussed: Current Exercise Habits: Home exercise routine, Type of exercise: walking, Time (Minutes): 30, Frequency (Times/Week): 3, Weekly Exercise (Minutes/Week): 90, Intensity: Mild, Exercise limited by: orthopedic condition(s)  Goals    .  DIET - EAT MORE FRUITS AND VEGETABLES    . Exercise 150 min/wk Moderate Activity     Exercise bike and/or walking 30 minutes each day      Depression Screen PHQ 2/9 Scores 10/15/2020 09/03/2020 02/11/2020 10/01/2019 02/21/2019 01/24/2019 12/04/2017  PHQ - 2 Score 0 0 0 0 0 0 0  PHQ- 9 Score - - 0 0 0 0 -    Fall Risk Fall Risk  09/03/2020 02/11/2020 10/15/2019 10/01/2019 12/04/2017  Falls in the past year? 0 0 0 0 No    FALL RISK PREVENTION PERTAINING TO THE HOME:  Any stairs in or around the home? Yes  If so, are there any without handrails? No  Home free of loose throw rugs in walkways, pet beds, electrical cords, etc? Yes  Adequate lighting in your home to reduce risk of falls? Yes   ASSISTIVE DEVICES UTILIZED TO PREVENT FALLS:  Life alert? No  Use of a cane, walker or w/c? No  Grab bars in the bathroom? Yes  Shower chair or bench in shower? Yes  Elevated toilet seat or a handicapped toilet? No   TIMED UP AND GO:  Was the test performed? No . Telephonic visit.   Cognitive Function: Normal cognitive status  assessed by direct observation by this Nurse Health Advisor. No abnormalities found.       6CIT Screen 10/15/2019  What Year? 0 points  What month? 0 points  What time? 0 points  Count back from 20 0 points  Months in reverse 0 points  Repeat phrase 0 points  Total Score 0    Immunizations Immunization History  Administered Date(s) Administered  . Moderna Sars-Covid-2 Vaccination 08/08/2019, 09/09/2019  . Tdap 05/17/2011    TDAP status: Up to date  Flu Vaccine status: Declined, Education has been provided regarding the importance of this vaccine but patient still declined. Advised may receive this vaccine at local pharmacy or Health Dept. Aware to provide a copy of the vaccination record if obtained from local pharmacy or Health Dept. Verbalized acceptance and understanding.  Pneumococcal vaccine status: Due, Education has been provided regarding the importance of this vaccine. Advised may receive this vaccine at local pharmacy or Health Dept. Aware to provide a copy of the vaccination record if obtained from local pharmacy or Health Dept. Verbalized acceptance and understanding.  Covid-19 vaccine status: Completed vaccines  Qualifies for Shingles Vaccine? Yes   Zostavax completed No   Shingrix Completed?: No.    Education has been provided regarding the importance of this vaccine. Patient has been advised to call insurance company to determine out of pocket expense if they have not yet received this vaccine. Advised may also receive vaccine at local pharmacy or Health Dept. Verbalized acceptance and understanding.  Screening Tests Health Maintenance  Topic Date Due  . PNA vac Low Risk Adult (1 of 2 - PCV13) Never done  . COVID-19 Vaccine (3 - Booster for Moderna series) 03/11/2020  . INFLUENZA VACCINE  02/01/2021  . TETANUS/TDAP  05/16/2021  . MAMMOGRAM  01/26/2022  . COLONOSCOPY (Pts 45-66yrs Insurance coverage will need to be confirmed)  02/09/2025  . DEXA SCAN  Completed   . Hepatitis C Screening  Completed  . HPV VACCINES  Aged Out    Health Maintenance  Health Maintenance Due  Topic Date Due  . PNA vac Low Risk Adult (1 of 2 - PCV13) Never done  . COVID-19 Vaccine (3 - Booster for Moderna series) 03/11/2020    Colorectal cancer  screening: Type of screening: Colonoscopy. Completed 02/10/2015. Repeat every 10 years  Mammogram status: Completed 01/27/2020. Repeat every year  Bone Density status: Completed 04/18/2017. Results reflect: Bone density results: OSTEOPENIA. Repeat every 2 years.  Lung Cancer Screening: (Low Dose CT Chest recommended if Age 48-80 years, 30 pack-year currently smoking OR have quit w/in 15years.) does not qualify.   Additional Screening:  Hepatitis C Screening: does qualify; Completed 02/19/2016  Vision Screening: Recommended annual ophthalmology exams for early detection of glaucoma and other disorders of the eye. Is the patient up to date with their annual eye exam?  No  Who is the provider or what is the name of the office in which the patient attends annual eye exams? [facility] in [place] If pt is not established with a provider, would they like to be referred to a provider to establish care? No .   Dental Screening: Recommended annual dental exams for proper oral hygiene  Community Resource Referral / Chronic Care Management: CRR required this visit?  No   CCM required this visit?  No      Plan:     I have personally reviewed and noted the following in the patient's chart:   . Medical and social history . Use of alcohol, tobacco or illicit drugs  . Current medications and supplements . Functional ability and status . Nutritional status . Physical activity . Advanced directives . List of other physicians . Hospitalizations, surgeries, and ER visits in previous 12 months . Vitals . Screenings to include cognitive, depression, and falls . Referrals and appointments  In addition, I have reviewed and discussed  with patient certain preventive protocols, quality metrics, and best practice recommendations. A written personalized care plan for preventive services as well as general preventive health recommendations were provided to patient.     Sandrea Hammond, LPN   09/15/9456   Nurse Notes: None

## 2020-10-15 NOTE — Patient Instructions (Signed)
Rachael Nguyen , Thank you for taking time to come for your Medicare Wellness Visit. I appreciate your ongoing commitment to your health goals. Please review the following plan we discussed and let me know if I can assist you in the future.   Screening recommendations/referrals: Colonoscopy: Done 02/10/2015 - Repeat in 10 years Mammogram: Done 01/27/2020 - Repeat annually at [facility]: Done 04/18/2017 - Repeat at next visit (every 2 years) Recommended yearly ophthalmology/optometry visit for glaucoma screening and checkup Recommended yearly dental visit for hygiene and checkup  Vaccinations: Influenza vaccine: Declined Pneumococcal vaccine: Due  Tdap vaccine: Done 05/17/2011 - Repeat every 10 years  Shingles vaccine: Shingrix discussed. Please contact your pharmacy for coverage information.    Covid-19:Done 08/08/19, 09/09/19, 05/13/2020  Advanced directives: Advance directive discussed with you today. I have provided a copy for you to complete at home and have notarized. Once this is complete please bring a copy in to our office so we can scan it into your chart.  Conditions/risks identified: Increase physical activity as tolerated - Try to go for a brisk walk or use recumbent bike for 20-30 minutes each day. Keep up the great work on the diet and weight loss journey!  Next appointment: Follow up in one year for your annual wellness visit    Preventive Care 65 Years and Older, Female Preventive care refers to lifestyle choices and visits with your health care provider that can promote health and wellness. What does preventive care include?  A yearly physical exam. This is also called an annual well check.  Dental exams once or twice a year.  Routine eye exams. Ask your health care provider how often you should have your eyes checked.  Personal lifestyle choices, including:  Daily care of your teeth and gums.  Regular physical activity.  Eating a healthy diet.  Avoiding  tobacco and drug use.  Limiting alcohol use.  Practicing safe sex.  Taking low-dose aspirin every day.  Taking vitamin and mineral supplements as recommended by your health care provider. What happens during an annual well check? The services and screenings done by your health care provider during your annual well check will depend on your age, overall health, lifestyle risk factors, and family history of disease. Counseling  Your health care provider may ask you questions about your:  Alcohol use.  Tobacco use.  Drug use.  Emotional well-being.  Home and relationship well-being.  Sexual activity.  Eating habits.  History of falls.  Memory and ability to understand (cognition).  Work and work Statistician.  Reproductive health. Screening  You may have the following tests or measurements:  Height, weight, and BMI.  Blood pressure.  Lipid and cholesterol levels. These may be checked every 5 years, or more frequently if you are over 49 years old.  Skin check.  Lung cancer screening. You may have this screening every year starting at age 30 if you have a 30-pack-year history of smoking and currently smoke or have quit within the past 15 years.  Fecal occult blood test (FOBT) of the stool. You may have this test every year starting at age 87.  Flexible sigmoidoscopy or colonoscopy. You may have a sigmoidoscopy every 5 years or a colonoscopy every 10 years starting at age 29.  Hepatitis C blood test.  Hepatitis B blood test.  Sexually transmitted disease (STD) testing.  Diabetes screening. This is done by checking your blood sugar (glucose) after you have not eaten for a while (fasting). You  may have this done every 1-3 years.  Bone density scan. This is done to screen for osteoporosis. You may have this done starting at age 68.  Mammogram. This may be done every 1-2 years. Talk to your health care provider about how often you should have regular  mammograms. Talk with your health care provider about your test results, treatment options, and if necessary, the need for more tests. Vaccines  Your health care provider may recommend certain vaccines, such as:  Influenza vaccine. This is recommended every year.  Tetanus, diphtheria, and acellular pertussis (Tdap, Td) vaccine. You may need a Td booster every 10 years.  Zoster vaccine. You may need this after age 32.  Pneumococcal 13-valent conjugate (PCV13) vaccine. One dose is recommended after age 60.  Pneumococcal polysaccharide (PPSV23) vaccine. One dose is recommended after age 2. Talk to your health care provider about which screenings and vaccines you need and how often you need them. This information is not intended to replace advice given to you by your health care provider. Make sure you discuss any questions you have with your health care provider. Document Released: 07/17/2015 Document Revised: 03/09/2016 Document Reviewed: 04/21/2015 Elsevier Interactive Patient Education  2017 [place] Prevention in the Home Falls can cause injuries. They can happen to people of all ages. There are many things you can do to make your home safe and to help prevent falls. What can I do on the outside of my home?  Regularly fix the edges of walkways and driveways and fix any cracks.  Remove anything that might make you trip as you walk through a door, such as a raised step or threshold.  Trim any bushes or trees on the path to your home.  Use bright outdoor lighting.  Clear any walking paths of anything that might make someone trip, such as rocks or tools.  Regularly check to see if handrails are loose or broken. Make sure that both sides of any steps have handrails.  Any raised decks and porches should have guardrails on the edges.  Have any leaves, snow, or ice cleared regularly.  Use sand or salt on walking paths during winter.  Clean up any spills in your garage  right away. This includes oil or grease spills. What can I do in the bathroom?  Use night lights.  Install grab bars by the toilet and in the tub and shower. Do not use towel bars as grab bars.  Use non-skid mats or decals in the tub or shower.  If you need to sit down in the shower, use a plastic, non-slip stool.  Keep the floor dry. Clean up any water that spills on the floor as soon as it happens.  Remove soap buildup in the tub or shower regularly.  Attach bath mats securely with double-sided non-slip rug tape.  Do not have throw rugs and other things on the floor that can make you trip. What can I do in the bedroom?  Use night lights.  Make sure that you have a light by your bed that is easy to reach.  Do not use any sheets or blankets that are too big for your bed. They should not hang down onto the floor.  Have a firm chair that has side arms. You can use this for support while you get dressed.  Do not have throw rugs and other things on the floor that can make you trip. What can I do in the kitchen?  Clean  up any spills right away.  Avoid walking on wet floors.  Keep items that you use a lot in easy-to-reach places.  If you need to reach something above you, use a strong step stool that has a grab bar.  Keep electrical cords out of the way.  Do not use floor polish or wax that makes floors slippery. If you must use wax, use non-skid floor wax.  Do not have throw rugs and other things on the floor that can make you trip. What can I do with my stairs?  Do not leave any items on the stairs.  Make sure that there are handrails on both sides of the stairs and use them. Fix handrails that are broken or loose. Make sure that handrails are as long as the stairways.  Check any carpeting to make sure that it is firmly attached to the stairs. Fix any carpet that is loose or worn.  Avoid having throw rugs at the top or bottom of the stairs. If you do have throw rugs,  attach them to the floor with carpet tape.  Make sure that you have a light switch at the top of the stairs and the bottom of the stairs. If you do not have them, ask someone to add them for you. What else can I do to help prevent falls?  Wear shoes that:  Do not have high heels.  Have rubber bottoms.  Are comfortable and fit you well.  Are closed at the toe. Do not wear sandals.  If you use a stepladder:  Make sure that it is fully opened. Do not climb a closed stepladder.  Make sure that both sides of the stepladder are locked into place.  Ask someone to hold it for you, if possible.  Clearly mark and make sure that you can see:  Any grab bars or handrails.  First and last steps.  Where the edge of each step is.  Use tools that help you move around (mobility aids) if they are needed. These include:  Canes.  Walkers.  Scooters.  Crutches.  Turn on the lights when you go into a dark area. Replace any light bulbs as soon as they burn out.  Set up your furniture so you have a clear path. Avoid moving your furniture around.  If any of your floors are uneven, fix them.  If there are any pets around you, be aware of where they are.  Review your medicines with your doctor. Some medicines can make you feel dizzy. This can increase your chance of falling. Ask your doctor what other things that you can do to help prevent falls. This information is not intended to replace advice given to you by your health care provider. Make sure you discuss any questions you have with your health care provider. Document Released: 04/16/2009 Document Revised: 11/26/2015 Document Reviewed: 07/25/2014 Elsevier Interactive Patient Education  2017 [employer].

## 2020-11-05 ENCOUNTER — Ambulatory Visit (INDEPENDENT_AMBULATORY_CARE_PROVIDER_SITE_OTHER): Payer: Medicare HMO

## 2020-11-05 DIAGNOSIS — I447 Left bundle-branch block, unspecified: Secondary | ICD-10-CM | POA: Diagnosis not present

## 2020-11-05 DIAGNOSIS — R000 Tachycardia, unspecified: Secondary | ICD-10-CM | POA: Diagnosis not present

## 2020-11-05 DIAGNOSIS — R072 Precordial pain: Secondary | ICD-10-CM | POA: Diagnosis not present

## 2020-11-05 LAB — ECHOCARDIOGRAM COMPLETE
Area-P 1/2: 4.29 cm2
Calc EF: 61.3 %
MV M vel: 3.6 m/s
MV Peak grad: 51.7 mmHg
S' Lateral: 2.92 cm
Single Plane A2C EF: 64.2 %
Single Plane A4C EF: 59.2 %

## 2020-11-10 ENCOUNTER — Telehealth: Payer: Self-pay | Admitting: Cardiology

## 2020-11-10 DIAGNOSIS — R9431 Abnormal electrocardiogram [ECG] [EKG]: Secondary | ICD-10-CM

## 2020-11-10 DIAGNOSIS — R072 Precordial pain: Secondary | ICD-10-CM

## 2020-11-10 NOTE — Telephone Encounter (Signed)
Minus Breeding, MD  11/06/2020 6:07 PM EDT      Echo is essentially normal. To further work-up chest discomfort and abnormal EKG Lexiscan Myoview is indicated. Please schedule.Call Ms. Wearing with the results and send results to [name], DO

## 2020-11-10 NOTE — Telephone Encounter (Signed)
Lm for pt to c/b for results of echo and to discuss need for Lexiscan.

## 2020-11-10 NOTE — Telephone Encounter (Signed)
Pt is returning a call in regards to her Echo results. Please advise

## 2020-11-11 ENCOUNTER — Telehealth: Payer: Self-pay | Admitting: Cardiology

## 2020-11-11 ENCOUNTER — Encounter: Payer: Self-pay | Admitting: *Deleted

## 2020-11-11 NOTE — Telephone Encounter (Signed)
Attempted to contact patient back as Pam, RN was out of office today.  Number was busy.  Will route to RN to make aware.

## 2020-11-11 NOTE — Telephone Encounter (Signed)
Spoke with patient and reviewed results and Dr Hochrein's order for [facility].  Reviewed testing procedure, location [facility]) and instructions.  Pt verbalized understanding.  She is aware is will be contacted to be scheduled.  She will need her instruction letter mailed to her home address as she reports she does not use her MyChart.

## 2020-11-11 NOTE — Telephone Encounter (Signed)
I called patient to schedule her for her Lexiscan per Hochrein. (5/20) While on the phone the patient stated she was told she has a Left Branch Bundle Block in Feb 22. She was wondering if she would keep having spells/if she would have that block forever. Will someone call her and advise?  Thanks in advance!

## 2020-11-11 NOTE — Telephone Encounter (Signed)
Left message to call back and ask for [facility], patient is a [place] patient

## 2020-11-11 NOTE — Telephone Encounter (Signed)
Pt called back in returning Pams call.    Best call back number is [phone]

## 2020-11-12 NOTE — Telephone Encounter (Signed)
PT is returning a call 

## 2020-11-12 NOTE — Telephone Encounter (Signed)
Called and spoke with pt.  Reviewed with her the possible causes of LBBB.  Advised her recent echo was structural normal.  Advised further testing that she is scheduled for will help determine if she possibly had any coronary artery disease.  She does not know of a previous EKG to determine if this is a normal varient for her.  Pt states understanding and was grateful for the call back and information.  She will c/b with any further questions/concerns.

## 2020-11-13 ENCOUNTER — Other Ambulatory Visit: Payer: Self-pay | Admitting: *Deleted

## 2020-11-13 DIAGNOSIS — R072 Precordial pain: Secondary | ICD-10-CM

## 2020-11-13 NOTE — Addendum Note (Signed)
Addended by: Minus Breeding on: 11/13/2020 05:20 PM   Modules accepted: Orders

## 2020-11-13 NOTE — Addendum Note (Signed)
Addended by: Alvina Filbert B on: 11/13/2020 01:58 PM   Modules accepted: Orders

## 2020-11-18 ENCOUNTER — Telehealth (HOSPITAL_COMMUNITY): Payer: Self-pay | Admitting: *Deleted

## 2020-11-18 NOTE — Telephone Encounter (Signed)
Left message on voicemail per DPR in reference to upcoming appointment scheduled on 11/20/20 at 10:30 with detailed instructions given per Myocardial Perfusion Study Information Sheet for the test. LM to arrive 15 minutes early, and that it is imperative to arrive on time for appointment to keep from having the test rescheduled. If you need to cancel or reschedule your appointment, please call the office within 24 hours of your appointment. Failure to do so may result in a cancellation of your appointment, and a $50 no show fee. Phone number given for call back for any questions.

## 2020-11-20 ENCOUNTER — Ambulatory Visit (INDEPENDENT_AMBULATORY_CARE_PROVIDER_SITE_OTHER): Payer: Medicare HMO | Admitting: Family

## 2020-11-20 ENCOUNTER — Ambulatory Visit (HOSPITAL_COMMUNITY): Payer: Medicare HMO

## 2020-11-20 ENCOUNTER — Encounter: Payer: Self-pay | Admitting: Family

## 2020-11-20 DIAGNOSIS — Z20822 Contact with and (suspected) exposure to covid-19: Secondary | ICD-10-CM | POA: Diagnosis not present

## 2020-11-20 MED ORDER — FLUTICASONE PROPIONATE 50 MCG/ACT NA SUSP: 2.0000 | Freq: Every day | NASAL | 6 refills | Status: DC

## 2020-11-20 MED ORDER — PREDNISONE 10 MG (21) PO TBPK: ORAL_TABLET | ORAL | 0 refills | Status: DC

## 2020-11-20 NOTE — Progress Notes (Signed)
Virtual Visit  Note Due to COVID-19 pandemic this visit was conducted virtually. This visit type was conducted due to national recommendations for restrictions regarding the COVID-19 Pandemic (e.g. social distancing, sheltering in place) in an effort to limit this patient's exposure and mitigate transmission in our community. All issues noted in this document were discussed and addressed.  A physical exam was not performed with this format.  I connected with Rachael Nguyen on 11/20/20 at 1:31 pm by telephone and verified that I am speaking with the correct person using two identifiers. Rachael Nguyen is currently located at home and husband is currently with her during visit. The provider, Evelina Dun, FNP is located in their office at time of visit.  I discussed the limitations, risks, security and privacy concerns of performing an evaluation and management service by telephone and the availability of in person appointments. I also discussed with the patient that there may be a patient responsible charge related to this service. The patient expressed understanding and agreed to proceed.   History and Present Illness:  Pt presents today with sore throat and congestion   that started yesterday. She reports her husband tested for COVID on Wednesday.  Sore Throat  This is a new problem. The current episode started yesterday. The problem has been waxing and waning. The pain is at a severity of 1/10. The pain is mild. Associated symptoms include congestion. Pertinent negatives include no ear discharge, ear pain, hoarse voice, shortness of breath or trouble swallowing. She has tried gargles and acetaminophen for the symptoms. The treatment provided mild relief.      Review of Systems  HENT: Positive for congestion. Negative for ear discharge, ear pain, hoarse voice and trouble swallowing.   Respiratory: Negative for shortness of breath.      Observations/Objective: No SOB or distress  noted   Assessment and Plan: 1. Exposure to COVID-19 virus COVID positive, rest, force fluids, tylenol as needed, Quarantine for at least 5 days and fever free, report any worsening symptoms such as increased shortness of breath, swelling, or continued high fevers.  - Novel Coronavirus, NAA ([facility]) - MyChart COVID-19 home monitoring program; Future - fluticasone (FLONASE) 50 MCG/ACT nasal spray; Place 2 sprays into both nostrils daily.  Dispense: 16 g; Refill: 6 - predniSONE (STERAPRED UNI-PAK 21 TAB) 10 MG (21) TBPK tablet; Use as directed  Dispense: 21 tablet; Refill: 0  2. Encounter by telehealth for suspected COVID-19 - predniSONE (STERAPRED UNI-PAK 21 TAB) 10 MG (21) TBPK tablet; Use as directed  Dispense: 21 tablet; Refill: 0     I discussed the assessment and treatment plan with the patient. The patient was provided an opportunity to ask questions and all were answered. The patient agreed with the plan and demonstrated an understanding of the instructions.   The patient was advised to call back or seek an in-person evaluation if the symptoms worsen or if the condition fails to improve as anticipated.  The above assessment and management plan was discussed with the patient. The patient verbalized understanding of and has agreed to the management plan. Patient is aware to call the clinic if symptoms persist or worsen. Patient is aware when to return to the clinic for a follow-up visit. Patient educated on when it is appropriate to go to the emergency department.   Time call ended:  1:42 pm   I provided 11 minutes of  non face-to-face time during this encounter.    Evelina Dun, FNP

## 2020-11-21 LAB — SARS-COV-2, NAA 2 DAY TAT

## 2020-11-21 LAB — NOVEL CORONAVIRUS, NAA: SARS-CoV-2, NAA: DETECTED — AB

## 2020-11-22 ENCOUNTER — Telehealth: Payer: Self-pay | Admitting: Nurse Practitioner

## 2020-11-22 NOTE — Telephone Encounter (Signed)
Called to discuss with patient about COVID-19 symptoms and the use of one of the available treatments for those with mild to moderate Covid symptoms and at a high risk of hospitalization.  Pt appears to qualify for outpatient treatment due to co-morbid conditions and/or a member of an at-risk group in accordance with the FDA Emergency Use Authorization.    Symptom onset: 11/19/20 Vaccinated: Yes Booster? Yes Immunocompromised? No  Qualifiers: obesity, tachycardia, hld NIH Criteria: 3  Unable to reach pt - Voicemail and Mychart message sent.   Alda Lea, NP [facility] [phone]

## 2020-11-25 ENCOUNTER — Telehealth (HOSPITAL_COMMUNITY): Payer: Self-pay | Admitting: *Deleted

## 2020-11-25 NOTE — Telephone Encounter (Signed)
Patient given detailed instructions per Myocardial Perfusion Study Information Sheet for the test on 12/01/20 at 11:00. Patient notified to arrive 15 minutes early and that it is imperative to arrive on time for appointment to keep from having the test rescheduled.  If you need to cancel or reschedule your appointment, please call the office within 24 hours of your appointment. . Patient verbalized understanding.Rachael Nguyen

## 2020-11-26 ENCOUNTER — Encounter (HOSPITAL_COMMUNITY): Payer: Medicare HMO

## 2020-11-27 ENCOUNTER — Telehealth: Payer: Self-pay | Admitting: Family Medicine

## 2020-11-27 NOTE — Telephone Encounter (Signed)
Patient had visit with Rachael Nguyen on 5/20 for covid symptoms.  She is upset that she was never notified of her Covid positive test.  She said the only way she found out was by the [facility] contacting her.  Patient does not use MyChart.  She is very upset and disappointed that we did not call her about it.  Please call.

## 2020-12-01 ENCOUNTER — Other Ambulatory Visit: Payer: Self-pay

## 2020-12-01 ENCOUNTER — Ambulatory Visit (HOSPITAL_COMMUNITY): Payer: Medicare HMO | Attending: Cardiovascular Disease

## 2020-12-01 DIAGNOSIS — R9431 Abnormal electrocardiogram [ECG] [EKG]: Secondary | ICD-10-CM | POA: Insufficient documentation

## 2020-12-01 DIAGNOSIS — R072 Precordial pain: Secondary | ICD-10-CM | POA: Insufficient documentation

## 2020-12-01 LAB — MYOCARDIAL PERFUSION IMAGING
LV dias vol: 58 mL (ref 46–106)
LV sys vol: 15 mL
Peak HR: 98 {beats}/min
Rest HR: 65 {beats}/min
SDS: 0
SRS: 2
SSS: 2
TID: 0.91

## 2020-12-01 MED ORDER — REGADENOSON 0.4 MG/5ML IV SOLN
0.4000 mg | Freq: Once | INTRAVENOUS | Status: AC
  Administered 2020-12-01: 0.4 mg via INTRAVENOUS

## 2020-12-01 MED ORDER — TECHNETIUM TC 99M TETROFOSMIN IV KIT
11.0000 | PACK | Freq: Once | INTRAVENOUS | Status: AC | PRN
  Administered 2020-12-01: 11 via INTRAVENOUS
  Filled 2020-12-01: qty 11

## 2020-12-01 MED ORDER — TECHNETIUM TC 99M TETROFOSMIN IV KIT
32.0000 | PACK | Freq: Once | INTRAVENOUS | Status: AC | PRN
  Administered 2020-12-01: 32 via INTRAVENOUS
  Filled 2020-12-01: qty 32

## 2020-12-01 MED ORDER — AMINOPHYLLINE 25 MG/ML IV SOLN
150.0000 mg | Freq: Once | INTRAVENOUS | Status: AC
  Administered 2020-12-01: 150 mg via INTRAVENOUS

## 2020-12-02 NOTE — Telephone Encounter (Signed)
Pt returned missed call. Wants to speak with nurse. Please call patient.

## 2020-12-09 NOTE — Telephone Encounter (Signed)
No return call 

## 2020-12-16 ENCOUNTER — Other Ambulatory Visit: Payer: Self-pay | Admitting: Family Medicine

## 2020-12-16 DIAGNOSIS — Z1231 Encounter for screening mammogram for malignant neoplasm of breast: Secondary | ICD-10-CM

## 2020-12-30 NOTE — Progress Notes (Signed)
Cardiology Office Note   Date:  01/01/2021   ID:  Prince, Olivier 1951-04-20, MRN [id number]  PCP:  [name], DO  Cardiologist:   Minus Breeding, MD Referring:  [name], DO  Chief Complaint  Patient presents with   Chest Pain       History of Present Illness: Rachael Nguyen is a 70 y.o. female who is referred for evaluation of chest pain and tachycardia.  She had a negative perfusion study.  Since that time she is not reporting any of the chest discomfort that she had before she presented.  She has not had any rapid heart rates.  She is been out in his yard doing a lot of yard work. The patient denies any new symptoms such as chest discomfort, neck or arm discomfort. There has been no new shortness of breath, PND or orthopnea. There have been no reported palpitations, presyncope or syncope   Past Medical History:  Diagnosis Date   Hyperlipidemia    Osteopenia 04/2015 T score -2.4   FRAX not calculated    Past Surgical History:  Procedure Laterality Date   COMBINED HYSTEROSCOPY DIAGNOSTIC / D&C  2011   polyp   DENTAL SURGERY     3 teeth extracted     Current Outpatient Medications  Medication Sig Dispense Refill   Ascorbic Acid (VITAMIN C ADULT GUMMIES PO) Take by mouth. Takes 2 daily     CALCIUM PO Take by mouth 2 (two) times daily.     Cholecalciferol (VITAMIN D3) 1000 UNITS CAPS Take by mouth.     famotidine (PEPCID) 40 MG tablet Take 40 mg by mouth daily.     ibuprofen (ADVIL) 600 MG tablet Take 1 tablet (600 mg total) by mouth every 8 (eight) hours as needed for moderate pain. 30 tablet 1   pravastatin (PRAVACHOL) 40 MG tablet TAKE 1 TABLET BY MOUTH EVERY DAY 90 tablet 3   No current facility-administered medications for this visit.    Allergies:   Hydrocodone-acetaminophen, Vicodin [hydrocodone-acetaminophen], Allegra-d [fexofenadine-pseudoephed er], and Codeine    ROS:  Please see the history of present illness.    Otherwise, review of systems are positive for none.   All other systems are reviewed and negative.    PHYSICAL EXAM: VS:  BP (!) 180/100 (BP Location: Left Arm)   Pulse 64   Ht 5\' 4"  (1.626 m)   Wt 209 lb 9.6 oz (95.1 kg)   SpO2 99%   BMI 35.98 kg/m  , BMI Body mass index is 35.98 kg/m. GENERAL:  Well appearing NECK:  No jugular venous distention, waveform within normal limits, carotid upstroke brisk and symmetric, no bruits, no thyromegaly LUNGS:  Clear to auscultation bilaterally CHEST:  Unremarkable HEART:  PMI not displaced or sustained,S1 and S2 within normal limits, no S3, no S4, no clicks, no rubs, no murmurs ABD:  Flat, positive bowel sounds normal in frequency in pitch, no bruits, no rebound, no guarding, no midline pulsatile mass, no hepatomegaly, no splenomegaly EXT:  2 plus pulses throughout, no edema, no cyanosis no clubbing       EKG:  EKG is not ordered today.   Recent Labs: 08/12/2020: ALT 14; BUN 14; Creatinine, Ser 0.80; Hemoglobin 13.7; Magnesium 1.9; Platelets 273; Potassium 4.2; Sodium 141; TSH 2.690    Lipid Panel    Component Value Date/Time   CHOL 159 02/11/2020 0921   TRIG 69 02/11/2020 0921   HDL 64 02/11/2020 0921  CHOLHDL 2.5 02/11/2020 0921   LDLCALC 82 02/11/2020 0921      Wt Readings from Last 3 Encounters:  01/01/21 209 lb 9.6 oz (95.1 kg)  12/01/20 213 lb (96.6 kg)  10/15/20 213 lb (96.6 kg)      Other studies Reviewed: Additional studies/ records that were reviewed today include: Lexiscan Myoview, echo Review of the above records demonstrates:  Please see elsewhere in the note.     ASSESSMENT AND PLAN:  CHEST PAIN:    There was no clear cardiac etiology to her pain.  She had no further episodes.  She had negative stress test and normal echo.  No change in therapy.   LBBB: She has a left bundle branch block with a structurally normal heart on echo and no evidence of ischemia or infarct.  No further work-up.  TACHYCARDIA:    She has had no further tachycardia episodes.  She can follow this with her wearable.   BORDERLINE HTN:   She showed me a blood pressure diary and her blood pressures are typically in the 130s over 80s.  She went to the wrong office today and was quite stressed.  She does not report blood pressures this high at home and she has a reliable cuff.  She will keep an eye on this and will manage it with weight control diet and exercise.   Current medicines are reviewed at length with the patient today.  The patient does not have concerns regarding medicines.  The following changes have been made:  None  Labs/ tests ordered today include: None  No orders of the defined types were placed in this encounter.    Disposition:   FU with me as needed.    Signed, Minus Breeding, MD  01/01/2021 10:14 AM    [place]

## 2021-01-01 ENCOUNTER — Other Ambulatory Visit: Payer: Self-pay

## 2021-01-01 ENCOUNTER — Encounter: Payer: Self-pay | Admitting: Cardiology

## 2021-01-01 ENCOUNTER — Ambulatory Visit: Payer: Medicare HMO | Admitting: Cardiology

## 2021-01-01 VITALS — BP 180/100 | HR 64 | Ht 64.0 in | Wt 209.6 lb

## 2021-01-01 DIAGNOSIS — R072 Precordial pain: Secondary | ICD-10-CM | POA: Diagnosis not present

## 2021-01-01 DIAGNOSIS — R000 Tachycardia, unspecified: Secondary | ICD-10-CM | POA: Diagnosis not present

## 2021-01-01 NOTE — Patient Instructions (Signed)
Medication Instructions:  Your physician recommends that you continue on your current medications as directed. Please refer to the Current Medication list given to you today.  *If you need a refill on your cardiac medications before your next appointment, please call your pharmacy*   Lab Work: NONE ordered at this time of appointment   If you have labs (blood work) drawn today and your tests are completely normal, you will receive your results only by: [place] (if you have MyChart) OR A paper copy in the mail If you have any lab test that is abnormal or we need to change your treatment, we will call you to review the results.   Testing/Procedures: NONE ordered at this time of appointment     Follow-Up: At [facility], you and your health needs are our priority.  As part of our continuing mission to provide you with exceptional heart care, we have created designated Provider Care Teams.  These Care Teams include your primary Cardiologist (physician) and Advanced Practice Providers (APPs -  Physician Assistants and Nurse Practitioners) who all work together to provide you with the care you need, when you need it.  We recommend signing up for the patient portal called "MyChart".  Sign up information is provided on this After Visit Summary.  MyChart is used to connect with patients for Virtual Visits (Telemedicine).  Patients are able to view lab/test results, encounter notes, upcoming appointments, etc.  Non-urgent messages can be sent to your provider as well.   To learn more about what you can do with MyChart, go to [email or web address].    Your next appointment:   As needed if symptoms worsen, fail to improve or develop new symptoms    The format for your next appointment:   In Person  Provider:   You may see Minus Breeding, MD or one of the following Advanced Practice Providers on your designated Care Team:   Rosaria Ferries, PA-C Jory Sims, DNP, ANP   Other  Instructions

## 2021-02-09 ENCOUNTER — Ambulatory Visit: Payer: Medicare HMO

## 2021-02-10 ENCOUNTER — Other Ambulatory Visit: Payer: Medicare HMO

## 2021-02-10 ENCOUNTER — Other Ambulatory Visit: Payer: Self-pay

## 2021-02-10 DIAGNOSIS — E782 Mixed hyperlipidemia: Secondary | ICD-10-CM

## 2021-02-10 LAB — CMP14+EGFR
ALT: 10 IU/L (ref 0–32)
AST: 16 IU/L (ref 0–40)
Albumin/Globulin Ratio: 1.8 (ref 1.2–2.2)
Albumin: 4.2 g/dL (ref 3.8–4.8)
Alkaline Phosphatase: 71 IU/L (ref 44–121)
BUN/Creatinine Ratio: 20 (ref 12–28)
BUN: 13 mg/dL (ref 8–27)
Bilirubin Total: 0.7 mg/dL (ref 0.0–1.2)
CO2: 23 mmol/L (ref 20–29)
Calcium: 9.3 mg/dL (ref 8.7–10.3)
Chloride: 105 mmol/L (ref 96–106)
Creatinine, Ser: 0.64 mg/dL (ref 0.57–1.00)
Globulin, Total: 2.3 g/dL (ref 1.5–4.5)
Glucose: 87 mg/dL (ref 65–99)
Potassium: 4.2 mmol/L (ref 3.5–5.2)
Sodium: 141 mmol/L (ref 134–144)
Total Protein: 6.5 g/dL (ref 6.0–8.5)
eGFR: 97 mL/min/{1.73_m2} (ref >59)

## 2021-02-10 LAB — CBC WITH DIFFERENTIAL/PLATELET
Basophils Absolute: 0.1 10*3/uL (ref 0.0–0.2)
Basos: 1 %
EOS (ABSOLUTE): 0.2 10*3/uL (ref 0.0–0.4)
Eos: 3 %
Hematocrit: 40.3 % (ref 34.0–46.6)
Hemoglobin: 12.9 g/dL (ref 11.1–15.9)
Immature Grans (Abs): 0 10*3/uL (ref 0.0–0.1)
Immature Granulocytes: 0 %
Lymphocytes Absolute: 2.3 10*3/uL (ref 0.7–3.1)
Lymphs: 45 %
MCH: 28.4 pg (ref 26.6–33.0)
MCHC: 32 g/dL (ref 31.5–35.7)
MCV: 89 fL (ref 79–97)
Monocytes Absolute: 0.4 10*3/uL (ref 0.1–0.9)
Monocytes: 7 %
Neutrophils Absolute: 2.2 10*3/uL (ref 1.4–7.0)
Neutrophils: 44 %
Platelets: 247 10*3/uL (ref 150–450)
RBC: 4.54 x10E6/uL (ref 3.77–5.28)
RDW: 13.6 % (ref 11.7–15.4)
WBC: 5.1 10*3/uL (ref 3.4–10.8)

## 2021-02-12 ENCOUNTER — Other Ambulatory Visit: Payer: Self-pay

## 2021-02-12 ENCOUNTER — Ambulatory Visit (INDEPENDENT_AMBULATORY_CARE_PROVIDER_SITE_OTHER): Payer: Medicare HMO | Admitting: Family Medicine

## 2021-02-12 ENCOUNTER — Encounter: Payer: Self-pay | Admitting: Family Medicine

## 2021-02-12 VITALS — BP 163/87 | HR 79 | Temp 97.6°F | Ht 64.0 in | Wt 204.2 lb

## 2021-02-12 DIAGNOSIS — R030 Elevated blood-pressure reading, without diagnosis of hypertension: Secondary | ICD-10-CM

## 2021-02-12 DIAGNOSIS — R69 Illness, unspecified: Secondary | ICD-10-CM | POA: Diagnosis not present

## 2021-02-12 DIAGNOSIS — Z0001 Encounter for general adult medical examination with abnormal findings: Secondary | ICD-10-CM

## 2021-02-12 DIAGNOSIS — F410 Panic disorder [episodic paroxysmal anxiety] without agoraphobia: Secondary | ICD-10-CM | POA: Diagnosis not present

## 2021-02-12 DIAGNOSIS — E782 Mixed hyperlipidemia: Secondary | ICD-10-CM | POA: Diagnosis not present

## 2021-02-12 DIAGNOSIS — F411 Generalized anxiety disorder: Secondary | ICD-10-CM

## 2021-02-12 DIAGNOSIS — Z0000 Encounter for general adult medical examination without abnormal findings: Secondary | ICD-10-CM

## 2021-02-12 MED ORDER — HYDROXYZINE HCL 10 MG PO TABS: 10.0000 mg | ORAL_TABLET | Freq: Three times a day (TID) | ORAL | 99 refills | Status: DC | PRN

## 2021-02-12 MED ORDER — DESVENLAFAXINE SUCCINATE ER 50 MG PO TB24: 50.0000 mg | ORAL_TABLET | Freq: Every day | ORAL | 1 refills | Status: DC

## 2021-02-12 MED ORDER — PRAVASTATIN SODIUM 40 MG PO TABS: ORAL_TABLET | ORAL | 3 refills | Status: DC

## 2021-02-12 NOTE — Patient Instructions (Signed)
Trial of pristiq for anxiety.  You can still use your hydroxyzine for breakthrough panic safely.  Continue monitoring BPs at home.  Bring this log into your next visit  How to Take Your Blood Pressure Blood pressure measures how strongly your blood is pressing against the walls of your arteries. Arteries are blood vessels that carry blood from your heartthroughout your body. You can take your blood pressure at home with a machine. You may need to check your blood pressure at home: To check if you have high blood pressure (hypertension). To check your blood pressure over time. To make sure your blood pressure medicine is working. Supplies needed: Blood pressure machine, or monitor. Dining room chair to sit in. Table or desk. Small notebook. Pencil or pen. How to prepare Avoid these things for 30 minutes before checking your blood pressure: Having drinks with caffeine in them, such as coffee or tea. Drinking alcohol. Eating. Smoking. Exercising. Do these things five minutes before checking your blood pressure: Go to the bathroom and pee (urinate). Sit in a dining chair. Do not sit on a soft couch or an armchair. Be quiet. Do not talk. How to take your blood pressure Follow the instructions that came with your machine. If you have a digital blood pressure monitor, these may be the instructions: Sit up straight. Place your feet on the floor. Do not cross your ankles or legs. Rest your left arm at the level of your heart. You may rest it on a table, desk, or chair. Pull up your shirt sleeve. Wrap the blood pressure cuff around the upper part of your left arm. The cuff should be 1 inch (2.5 cm) above your elbow. It is best to wrap the cuff around bare skin. Fit the cuff snugly around your arm. You should be able to place only one finger between the cuff and your arm. Place the cord so that it rests in the bend of your elbow. Press the power button. Sit quietly while the cuff fills with  air and loses air. Write down the numbers on the screen. Wait 2-3 minutes and then repeat steps 1-10. What do the numbers mean? Two numbers make up your blood pressure. The first number is called systolic pressure. The second is called diastolic pressure. An example of a bloodpressure reading is "120 over 80" (or 120/80). If you are an adult and do not have a medical condition, use this guide to findout if your blood pressure is normal: Normal First number: below 120. Second number: below 80. Elevated First number: 120-129. Second number: below 80. Hypertension stage 1 First number: 130-139. Second number: 80-89. Hypertension stage 2 First number: 140 or above. Second number: 102 or above. Your blood pressure is above normal even if only the first or only the secondnumber is above normal. Follow these instructions at home: Medicines Take over-the-counter and prescription medicines only as told by your doctor. Tell your doctor if your medicine is causing side effects. General instructions Check your blood pressure as often as your doctor tells you to. Check your blood pressure at the same time every day. Take your monitor to your next doctor's appointment. Your doctor will: Make sure you are using it correctly. Make sure it is working right. Understand what your blood pressure numbers should be. Keep all follow-up visits as told by your doctor. This is important. General tips You will need a blood pressure machine, or monitor. Your doctor can suggest a monitor. You can buy one at  a drugstore or online. When choosing one: Choose one with an arm cuff. Choose one that wraps around your upper arm. Only one finger should fit between your arm and the cuff. Do not choose one that measures your blood pressure from your wrist or finger. Where to find more information American Heart Association: www.heart.org Contact a doctor if: Your blood pressure keeps being high. Your blood pressure  is suddenly low. Get help right away if: Your first blood pressure number is higher than 180. Your second blood pressure number is higher than 120. Summary Check your blood pressure at the same time every day. Avoid caffeine, alcohol, smoking, and exercise for 30 minutes before checking your blood pressure. Make sure you understand what your blood pressure numbers should be. This information is not intended to replace advice given to you by your health care provider. Make sure you discuss any questions you have with your healthcare provider. Document Revised: 04/29/2020 Document Reviewed: 06/14/2019 Elsevier Patient Education  2022 [employer].

## 2021-02-12 NOTE — Progress Notes (Signed)
Rachael Nguyen is a 70 y.o. female presents to office today for annual physical exam examination.    Concerns today include: 1.  Elevated blood pressure, dizziness and nausea Patient reports that she has been having intermittent elevated blood pressures.  She recently woke up nauseated and dizzy and decided to check her blood pressure and systolics were reportedly in the 190s.  She has been seen by cardiology and has known left bundle branch block.  She notes that this weighs on her mind a lot and she worries about every little symptom being related to her heart.  She has had a full work-up and recognizes that it was normal but she still worries about this.  Recently when she was having one of her episodes she took one of the Atarax 10 mg that were prescribed by one of my colleagues.  She did feel that this helped and her blood pressure normalized.  She is confused as to whether or not she has high blood pressure or something else going on.  Her husband feel that she may benefit from something daily and preventative.  She is willing to try something like this.  Marital status: married, Substance use: none Diet: balanced (working hard to improve weight and health), Exercise: active Last eye exam: needs Last dental exam: UTD Last colonoscopy: UTD Last mammogram: UTD Last pap smear: UTD Refills needed today: n/a Immunizations needed: Immunization History  Administered Date(s) Administered   Moderna Sars-Covid-2 Vaccination 08/08/2019, 09/09/2019, 05/13/2020   Tdap 05/17/2011     Past Medical History:  Diagnosis Date   Hyperlipidemia    Osteopenia 04/2015 T score -2.4   FRAX not calculated   Social History   Socioeconomic History   Marital status: Married    Spouse name: Not on file   Number of children: 0   Years of education: Not on file   Highest education level: High school graduate  Occupational History   Occupation: Retired  Tobacco Use   Smoking status: Never    Smokeless tobacco: Never  Vaping Use   Vaping Use: Never used  Substance and Sexual Activity   Alcohol use: No    Alcohol/week: 0.0 standard drinks   Drug use: No   Sexual activity: Yes  Other Topics Concern   Not on file  Social History Narrative   Married.     Social Determinants of Health   Financial Resource Strain: Not on file  Food Insecurity: Not on file  Transportation Needs: Not on file  Physical Activity: Insufficiently Active   Days of Exercise per Week: 3 days   Minutes of Exercise per Session: 30 min  Stress: No Stress Concern Present   Feeling of Stress : Not at all  Social Connections: Socially Integrated   Frequency of Communication with Friends and Family: More than three times a week   Frequency of Social Gatherings with Friends and Family: Three times a week   Attends Religious Services: More than 4 times per year   Active Member of Clubs or Organizations: Yes   Attends Archivist Meetings: More than 4 times per year   Marital Status: Married  Human resources officer Violence: Not on file   Past Surgical History:  Procedure Laterality Date   COMBINED HYSTEROSCOPY DIAGNOSTIC / D&C  2011   polyp   DENTAL SURGERY     3 teeth extracted   Family History  Problem Relation Age of Onset   Breast cancer Paternal Aunt    Hypertension Mother  Dementia Mother    Leukemia Father    Colon cancer Neg Hx    Esophageal cancer Neg Hx    Pancreatic cancer Neg Hx    Rectal cancer Neg Hx    Stomach cancer Neg Hx    Ulcerative colitis Neg Hx    Crohn's disease Neg Hx     Current Outpatient Medications:    Ascorbic Acid (VITAMIN C ADULT GUMMIES PO), Take by mouth. Takes 2 daily, Disp: , Rfl:    Cholecalciferol (VITAMIN D3) 1000 UNITS CAPS, Take by mouth., Disp: , Rfl:    famotidine (PEPCID) 40 MG tablet, Take 40 mg by mouth daily., Disp: , Rfl:    ibuprofen (ADVIL) 600 MG tablet, Take 1 tablet (600 mg total) by mouth every 8 (eight) hours as needed for  moderate pain., Disp: 30 tablet, Rfl: 1   pravastatin (PRAVACHOL) 40 MG tablet, TAKE 1 TABLET BY MOUTH EVERY DAY, Disp: 90 tablet, Rfl: 3   CALCIUM PO, Take by mouth 2 (two) times daily. (Patient not taking: Reported on 02/12/2021), Disp: , Rfl:   Allergies  Allergen Reactions   Hydrocodone-Acetaminophen    Vicodin [Hydrocodone-Acetaminophen] Nausea Only    dizziness   Allegra-D [Fexofenadine-Pseudoephed Er] Nausea Only   Codeine Other (See Comments)    GI upset; dizziness     ROS: Review of Systems Pertinent items noted in HPI and remainder of comprehensive ROS otherwise negative.    Physical exam BP (!) 163/87   Pulse 79   Temp 97.6 F (36.4 C)   Ht '5\' 4"'$  (1.626 m)   Wt 204 lb 3.2 oz (92.6 kg)   SpO2 99%   BMI 35.05 kg/m  General appearance: alert, cooperative, appears stated age, mild distress, and mildly obese Head: Normocephalic, without obvious abnormality, atraumatic Eyes: negative findings: lids and lashes normal, conjunctivae and sclerae normal, corneas clear, and pupils equal, round, reactive to light and accomodation Ears: normal TM's and external ear canals both ears Nose: Nares normal. Septum midline. Mucosa normal. No drainage or sinus tenderness. Throat: lips, mucosa, and tongue normal; teeth and gums normal Neck: no adenopathy, no carotid bruit, supple, symmetrical, trachea midline, and thyroid not enlarged, symmetric, no tenderness/mass/nodules Back: symmetric, no curvature. ROM normal. No CVA tenderness. Lungs: clear to auscultation bilaterally Heart: regular rate and rhythm, S1, S2 normal, no murmur, click, rub or gallop Abdomen: soft, non-tender; bowel sounds normal; no masses,  no organomegaly Extremities: extremities normal, atraumatic, no cyanosis or edema Pulses: 2+ and symmetric Skin:  Various areas of hyperpigmentation and notable seborrheic keratoses.  She follows dermatology yearly Lymph nodes: Cervical, supraclavicular, and axillary nodes  normal. Neurologic: Alert and oriented X 3, normal strength and tone. Normal symmetric reflexes. Normal coordination and gait Psych: Somewhat anxious on exam but great eye contact.  Thought processes linear.  Her mood is stable and she does not appear to be responding to any internal stimuli Depression screen [facility] 2/9 02/12/2021 10/15/2020 09/03/2020  Decreased Interest 0 0 0  Down, Depressed, Hopeless 0 0 0  PHQ - 2 Score 0 0 0  Altered sleeping - - -  Tired, decreased energy - - -  Change in appetite - - -  Feeling bad or failure about yourself  - - -  Trouble concentrating - - -  Moving slowly or fidgety/restless - - -  Suicidal thoughts - - -  PHQ-9 Score - - -   GAD 7 : Generalized Anxiety Score 02/12/2021 09/03/2020  Nervous, Anxious, on Edge 0 0  Control/stop worrying 0 0  Worry too much - different things 0 0  Trouble relaxing 0 0  Restless 0 0  Easily annoyed or irritable 0 0  Afraid - awful might happen 0 2  Total GAD 7 Score 0 2  Anxiety Difficulty Not difficult at all -    Assessment/ Plan: [name] here for annual physical exam.   Annual physical exam  Mixed hyperlipidemia  Morbid obesity ([place])  White coat syndrome without hypertension  Generalized anxiety disorder with panic attacks - Plan: desvenlafaxine (PRISTIQ) 50 MG 24 hr tablet  We reviewed her labs which were normal.  She is actively working on lifestyle modification with successful weight loss thus far.  Cholesterol was controlled.  Blood pressure remains elevated but I do think that this is related to an underlying anxiety disorder with panic attacks.  Her GAD and PHQ do not reflect this but I wonder if she is a bit more stoic.  She has had a fairly thorough work-up with cardiology which really did not show any concerns for ischemia.  She has responded well to as needed anxiolytics but I agree that she would likely would respond well to a preventative medication.  For this reason we will start  Pristiq.  I would like her to continue monitoring blood pressures on a daily basis.  I hope that her blood pressure will start normalizing as her anxiety gets better.  Could consider referral to counseling services if she is willing to try coping strategies and stress management.  We will plan to see her back again in about 6 weeks, sooner if needed  Delynn Pursley M. Lajuana Ripple, DO

## 2021-02-15 ENCOUNTER — Telehealth: Payer: Self-pay

## 2021-02-15 NOTE — Telephone Encounter (Signed)
Rachael Nguyen Key: [id number] - PA Case ID: [id number] - Rx #: [id number]

## 2021-02-16 NOTE — Telephone Encounter (Signed)
We have denied coverage or payment under your Medicare Part D benefit for the following prescription drug(s)  that you or your prescriber requested: DESVENLAFAXINE SUCCINATE ER Tablet ER 24HR Why did we deny your request? We denied this request under Medicare Part D because: The information provided by your prescriber did not  meet the requirements for covering this medication (prior authorization).  Your plan does not allow coverage of this medication based on your prescriber answering No to the following  question(s): Is the requested drug being prescribed for the treatment of an adult patient with major depressive disorder  (MDD)? Has the patient experienced an inadequate treatment response, intolerance, or does the patient have a  contraindication to TWO of the following: A) serotonin and norepinephrine reuptake inhibitors (SNRIs), B)  selective serotonin reuptake inhibitors (SSRIs), C) mirtazapine, D) bupropion? You should share a copy of this decision with your prescriber so you and your prescriber can discuss next steps.  If your prescriber requested coverage on your behalf, we have shared this decision with your prescriber

## 2021-02-16 NOTE — Telephone Encounter (Signed)
Pristiq is SNRI. Do not recommend SSRI due to history of arrhythmia.   Can send in Effexor '75mg'$  daily if she wishes to try that instead.

## 2021-02-22 NOTE — Telephone Encounter (Signed)
$'75mg'c$  is the lowest dose available.

## 2021-02-22 NOTE — Telephone Encounter (Signed)
Pt is ok to try effexor but she was wondering a lower dosage could be sent it, says '75mg'$  sounds like a lot since she was taking hydroxyzine at '10mg'$ , states she did not want to start out at that dosage.

## 2021-02-22 NOTE — Telephone Encounter (Signed)
Agree with dc of Hydroxyzine.  Did she want me to send a replacement for the Pristiq which her insurance denied?

## 2021-02-22 NOTE — Telephone Encounter (Signed)
Pt has been taking hydroxyzine and thinks it has been giving her pressure in her chest. She is unsure if the meds have been causing this, says it has gotten better since not taking hydroxyzine daily

## 2021-02-23 NOTE — Telephone Encounter (Signed)
Pt would like to wait on meds for now - decided against Effexor - will discuss w/you at next OV

## 2021-03-26 ENCOUNTER — Ambulatory Visit: Payer: Medicare HMO | Admitting: Family Medicine

## 2021-03-30 ENCOUNTER — Ambulatory Visit
Admission: RE | Admit: 2021-03-30 | Discharge: 2021-03-30 | Disposition: A | Discharge status: Home / Self Care | Payer: Medicare HMO | Source: Ambulatory Visit | Attending: Family Medicine | Admitting: Family Medicine

## 2021-03-30 ENCOUNTER — Other Ambulatory Visit: Payer: Self-pay

## 2021-03-30 DIAGNOSIS — Z1231 Encounter for screening mammogram for malignant neoplasm of breast: Secondary | ICD-10-CM

## 2021-04-07 ENCOUNTER — Ambulatory Visit (INDEPENDENT_AMBULATORY_CARE_PROVIDER_SITE_OTHER): Payer: Medicare HMO | Admitting: Family Medicine

## 2021-04-07 ENCOUNTER — Other Ambulatory Visit: Payer: Self-pay

## 2021-04-07 ENCOUNTER — Encounter: Payer: Self-pay | Admitting: Family Medicine

## 2021-04-07 VITALS — BP 136/71 | HR 63 | Temp 97.1°F | Ht 64.0 in | Wt 206.0 lb

## 2021-04-07 DIAGNOSIS — F411 Generalized anxiety disorder: Secondary | ICD-10-CM

## 2021-04-07 DIAGNOSIS — Z0130 Encounter for examination of blood pressure without abnormal findings: Secondary | ICD-10-CM | POA: Diagnosis not present

## 2021-04-07 DIAGNOSIS — F410 Panic disorder [episodic paroxysmal anxiety] without agoraphobia: Secondary | ICD-10-CM

## 2021-04-07 DIAGNOSIS — R69 Illness, unspecified: Secondary | ICD-10-CM | POA: Diagnosis not present

## 2021-04-07 NOTE — Progress Notes (Signed)
Subjective: CC:GAD, recheck BP PCP: Rachael Norlander, DO UUV:OZDGUY Rachael Nguyen is a 70 y.o. female presenting to clinic today for:  1. GAD w/ panic attacks, elevated BP w/out dx of HTN At last visit patient was having some labile blood pressures, specifically having intermittent hypertension.  She had normal laboratory work-up.  Symptoms were thought to be secondary to uncontrolled anxiety disorder and therefore she was started on Pristiq 50 mg as well as given Atarax as needed.  She presents today and notes that blood pressures have been under total control since our visit.  Systolics running [id number] over diastolics of 59D.  She had no recurrence of dizziness or high blood pressures.  She never started the Pristiq.   ROS: Per HPI  Allergies  Allergen Reactions   Hydrocodone-Acetaminophen    Vicodin [Hydrocodone-Acetaminophen] Nausea Only    dizziness   Allegra-D [Fexofenadine-Pseudoephed Er] Nausea Only   Codeine Other (See Comments)    GI upset; dizziness   Past Medical History:  Diagnosis Date   Hyperlipidemia    Osteopenia 04/2015 T score -2.4   FRAX not calculated    Current Outpatient Medications:    Ascorbic Acid (VITAMIN C ADULT GUMMIES PO), Take by mouth. Takes 2 daily, Disp: , Rfl:    famotidine (PEPCID) 40 MG tablet, Take 40 mg by mouth daily., Disp: , Rfl:    ibuprofen (ADVIL) 600 MG tablet, Take 1 tablet (600 mg total) by mouth every 8 (eight) hours as needed for moderate pain., Disp: 30 tablet, Rfl: 1   pravastatin (PRAVACHOL) 40 MG tablet, TAKE 1 TABLET BY MOUTH EVERY DAY, Disp: 90 tablet, Rfl: 3   CALCIUM PO, Take by mouth 2 (two) times daily. (Patient not taking: No sig reported), Disp: , Rfl:    desvenlafaxine (PRISTIQ) 50 MG 24 hr tablet, Take 1 tablet (50 mg total) by mouth daily. (Patient not taking: Reported on 04/07/2021), Disp: 30 tablet, Rfl: 1   hydrOXYzine (ATARAX/VISTARIL) 10 MG tablet, Take 1 tablet (10 mg total) by mouth 3 (three) times daily as  needed (place on hold). (Patient not taking: Reported on 04/07/2021), Disp: 30 tablet, Rfl: PRN Social History   Socioeconomic History   Marital status: Married    Spouse name: Not on file   Number of children: 0   Years of education: Not on file   Highest education level: High school graduate  Occupational History   Occupation: Retired  Tobacco Use   Smoking status: Never   Smokeless tobacco: Never  Vaping Use   Vaping Use: Never used  Substance and Sexual Activity   Alcohol use: No    Alcohol/week: 0.0 standard drinks   Drug use: No   Sexual activity: Yes  Other Topics Concern   Not on file  Social History Narrative   Married.     Social Determinants of Health   Financial Resource Strain: Not on file  Food Insecurity: Not on file  Transportation Needs: Not on file  Physical Activity: Insufficiently Active   Days of Exercise per Week: 3 days   Minutes of Exercise per Session: 30 min  Stress: No Stress Concern Present   Feeling of Stress : Not at all  Social Connections: Socially Integrated   Frequency of Communication with Friends and Family: More than three times a week   Frequency of Social Gatherings with Friends and Family: Three times a week   Attends Religious Services: More than 4 times per year   Active Member of Clubs or Organizations:  Yes   Attends Archivist Meetings: More than 4 times per year   Marital Status: Married  Human resources officer Violence: Not on file   Family History  Problem Relation Age of Onset   Breast cancer Paternal Aunt    Hypertension Mother    Dementia Mother    Leukemia Father    Colon cancer Neg Hx    Esophageal cancer Neg Hx    Pancreatic cancer Neg Hx    Rectal cancer Neg Hx    Stomach cancer Neg Hx    Ulcerative colitis Neg Hx    Crohn's disease Neg Hx     Objective: Office vital signs reviewed. BP 136/71   Pulse 63   Temp (!) 97.1 F (36.2 C)   Ht 5\' 4"  (1.626 m)   Wt 206 lb (93.4 kg)   SpO2 98%   BMI  35.36 kg/m   Physical Examination:  General: Awake, alert, well nourished, No acute distress HEENT: Normal, sclera white, MMM Cardio: regular rate and rhythm, S1S2 heard, no murmurs appreciated Pulm: clear to auscultation bilaterally, no wheezes, rhonchi or rales; normal work of breathing on room air Extremities: warm, well perfused, No edema, cyanosis or clubbing; +2 pulses bilaterally Psych: mood stable speech normal.  Depression screen [facility] 2/9 04/07/2021 02/12/2021 10/15/2020  Decreased Interest 0 0 0  Down, Depressed, Hopeless 0 0 0  PHQ - 2 Score 0 0 0  Altered sleeping - - -  Tired, decreased energy - - -  Change in appetite - - -  Feeling bad or failure about yourself  - - -  Trouble concentrating - - -  Moving slowly or fidgety/restless - - -  Suicidal thoughts - - -  PHQ-9 Score - - -    Assessment/ Plan: 70 y.o. female   Generalized anxiety disorder with panic attacks  Blood pressure check  Seems to have resolved without medication.  Blood pressure is normal.  Uncertain etiology of symptoms initially but she seems to be doing okay without medications.  She will follow-up in 6 months for recheck, sooner if concerns arise  No orders of the defined types were placed in this encounter.  No orders of the defined types were placed in this encounter.    Rachael Norlander, DO [place] [phone]

## 2021-09-01 DIAGNOSIS — M62830 Muscle spasm of back: Secondary | ICD-10-CM | POA: Diagnosis not present

## 2021-09-01 DIAGNOSIS — M9904 Segmental and somatic dysfunction of sacral region: Secondary | ICD-10-CM | POA: Diagnosis not present

## 2021-09-01 DIAGNOSIS — M9905 Segmental and somatic dysfunction of pelvic region: Secondary | ICD-10-CM | POA: Diagnosis not present

## 2021-09-01 DIAGNOSIS — M9903 Segmental and somatic dysfunction of lumbar region: Secondary | ICD-10-CM | POA: Diagnosis not present

## 2021-09-02 DIAGNOSIS — M9903 Segmental and somatic dysfunction of lumbar region: Secondary | ICD-10-CM | POA: Diagnosis not present

## 2021-09-02 DIAGNOSIS — M62830 Muscle spasm of back: Secondary | ICD-10-CM | POA: Diagnosis not present

## 2021-09-02 DIAGNOSIS — M9905 Segmental and somatic dysfunction of pelvic region: Secondary | ICD-10-CM | POA: Diagnosis not present

## 2021-09-02 DIAGNOSIS — M9904 Segmental and somatic dysfunction of sacral region: Secondary | ICD-10-CM | POA: Diagnosis not present

## 2021-09-08 DIAGNOSIS — M9904 Segmental and somatic dysfunction of sacral region: Secondary | ICD-10-CM | POA: Diagnosis not present

## 2021-09-08 DIAGNOSIS — M9905 Segmental and somatic dysfunction of pelvic region: Secondary | ICD-10-CM | POA: Diagnosis not present

## 2021-09-08 DIAGNOSIS — M9903 Segmental and somatic dysfunction of lumbar region: Secondary | ICD-10-CM | POA: Diagnosis not present

## 2021-09-08 DIAGNOSIS — M62830 Muscle spasm of back: Secondary | ICD-10-CM | POA: Diagnosis not present

## 2021-09-09 DIAGNOSIS — M9904 Segmental and somatic dysfunction of sacral region: Secondary | ICD-10-CM | POA: Diagnosis not present

## 2021-09-09 DIAGNOSIS — M9905 Segmental and somatic dysfunction of pelvic region: Secondary | ICD-10-CM | POA: Diagnosis not present

## 2021-09-09 DIAGNOSIS — M62830 Muscle spasm of back: Secondary | ICD-10-CM | POA: Diagnosis not present

## 2021-09-09 DIAGNOSIS — M9903 Segmental and somatic dysfunction of lumbar region: Secondary | ICD-10-CM | POA: Diagnosis not present

## 2021-09-13 DIAGNOSIS — M9903 Segmental and somatic dysfunction of lumbar region: Secondary | ICD-10-CM | POA: Diagnosis not present

## 2021-09-13 DIAGNOSIS — M9905 Segmental and somatic dysfunction of pelvic region: Secondary | ICD-10-CM | POA: Diagnosis not present

## 2021-09-13 DIAGNOSIS — M62830 Muscle spasm of back: Secondary | ICD-10-CM | POA: Diagnosis not present

## 2021-09-13 DIAGNOSIS — M9904 Segmental and somatic dysfunction of sacral region: Secondary | ICD-10-CM | POA: Diagnosis not present

## 2021-09-15 DIAGNOSIS — M9905 Segmental and somatic dysfunction of pelvic region: Secondary | ICD-10-CM | POA: Diagnosis not present

## 2021-09-15 DIAGNOSIS — M62830 Muscle spasm of back: Secondary | ICD-10-CM | POA: Diagnosis not present

## 2021-09-15 DIAGNOSIS — M9903 Segmental and somatic dysfunction of lumbar region: Secondary | ICD-10-CM | POA: Diagnosis not present

## 2021-09-15 DIAGNOSIS — M9904 Segmental and somatic dysfunction of sacral region: Secondary | ICD-10-CM | POA: Diagnosis not present

## 2021-09-20 DIAGNOSIS — M9905 Segmental and somatic dysfunction of pelvic region: Secondary | ICD-10-CM | POA: Diagnosis not present

## 2021-09-20 DIAGNOSIS — M9904 Segmental and somatic dysfunction of sacral region: Secondary | ICD-10-CM | POA: Diagnosis not present

## 2021-09-20 DIAGNOSIS — M9903 Segmental and somatic dysfunction of lumbar region: Secondary | ICD-10-CM | POA: Diagnosis not present

## 2021-09-20 DIAGNOSIS — M62830 Muscle spasm of back: Secondary | ICD-10-CM | POA: Diagnosis not present

## 2021-09-22 DIAGNOSIS — M9903 Segmental and somatic dysfunction of lumbar region: Secondary | ICD-10-CM | POA: Diagnosis not present

## 2021-09-22 DIAGNOSIS — M9905 Segmental and somatic dysfunction of pelvic region: Secondary | ICD-10-CM | POA: Diagnosis not present

## 2021-09-22 DIAGNOSIS — M62830 Muscle spasm of back: Secondary | ICD-10-CM | POA: Diagnosis not present

## 2021-09-22 DIAGNOSIS — M9904 Segmental and somatic dysfunction of sacral region: Secondary | ICD-10-CM | POA: Diagnosis not present

## 2021-09-27 DIAGNOSIS — M62830 Muscle spasm of back: Secondary | ICD-10-CM | POA: Diagnosis not present

## 2021-09-27 DIAGNOSIS — M9905 Segmental and somatic dysfunction of pelvic region: Secondary | ICD-10-CM | POA: Diagnosis not present

## 2021-09-27 DIAGNOSIS — M9903 Segmental and somatic dysfunction of lumbar region: Secondary | ICD-10-CM | POA: Diagnosis not present

## 2021-09-27 DIAGNOSIS — M9904 Segmental and somatic dysfunction of sacral region: Secondary | ICD-10-CM | POA: Diagnosis not present

## 2021-09-29 DIAGNOSIS — M9905 Segmental and somatic dysfunction of pelvic region: Secondary | ICD-10-CM | POA: Diagnosis not present

## 2021-09-29 DIAGNOSIS — M9904 Segmental and somatic dysfunction of sacral region: Secondary | ICD-10-CM | POA: Diagnosis not present

## 2021-09-29 DIAGNOSIS — M62830 Muscle spasm of back: Secondary | ICD-10-CM | POA: Diagnosis not present

## 2021-09-29 DIAGNOSIS — M9903 Segmental and somatic dysfunction of lumbar region: Secondary | ICD-10-CM | POA: Diagnosis not present

## 2021-10-04 DIAGNOSIS — M62830 Muscle spasm of back: Secondary | ICD-10-CM | POA: Diagnosis not present

## 2021-10-04 DIAGNOSIS — M9905 Segmental and somatic dysfunction of pelvic region: Secondary | ICD-10-CM | POA: Diagnosis not present

## 2021-10-04 DIAGNOSIS — M9904 Segmental and somatic dysfunction of sacral region: Secondary | ICD-10-CM | POA: Diagnosis not present

## 2021-10-04 DIAGNOSIS — M9903 Segmental and somatic dysfunction of lumbar region: Secondary | ICD-10-CM | POA: Diagnosis not present

## 2021-10-06 ENCOUNTER — Ambulatory Visit (INDEPENDENT_AMBULATORY_CARE_PROVIDER_SITE_OTHER): Payer: Medicare HMO | Admitting: Family Medicine

## 2021-10-06 ENCOUNTER — Encounter: Payer: Self-pay | Admitting: Family Medicine

## 2021-10-06 VITALS — BP 167/82 | HR 85 | Temp 98.3°F | Ht 64.0 in | Wt 225.4 lb

## 2021-10-06 DIAGNOSIS — R030 Elevated blood-pressure reading, without diagnosis of hypertension: Secondary | ICD-10-CM

## 2021-10-06 DIAGNOSIS — M9904 Segmental and somatic dysfunction of sacral region: Secondary | ICD-10-CM | POA: Diagnosis not present

## 2021-10-06 DIAGNOSIS — Z130 Encounter for screening for diseases of the blood and blood-forming organs and certain disorders involving the immune mechanism: Secondary | ICD-10-CM | POA: Diagnosis not present

## 2021-10-06 DIAGNOSIS — E782 Mixed hyperlipidemia: Secondary | ICD-10-CM

## 2021-10-06 DIAGNOSIS — M9905 Segmental and somatic dysfunction of pelvic region: Secondary | ICD-10-CM | POA: Diagnosis not present

## 2021-10-06 DIAGNOSIS — M9903 Segmental and somatic dysfunction of lumbar region: Secondary | ICD-10-CM | POA: Diagnosis not present

## 2021-10-06 DIAGNOSIS — M62830 Muscle spasm of back: Secondary | ICD-10-CM | POA: Diagnosis not present

## 2021-10-06 MED ORDER — PRAVASTATIN SODIUM 40 MG PO TABS: ORAL_TABLET | ORAL | 3 refills | Status: DC

## 2021-10-06 NOTE — Progress Notes (Signed)
? ?Subjective: ?CC: 10-monthfollow-up ?PCP: GJanora Norlander DO ?HGBE:EFEOFHJSurah Pelleyis a 71y.o. female presenting to clinic today for: ? ?1.  Hyperlipidemia, obesity, whitecoat syndrome ?She notes that blood pressures are always normal at home but she gets worked up coming [facility] the [facility]office and therefore it is always high.  No chest pain, shortness of breath, dizziness.  She is not currently following a restricted diet but does desire weight loss.  Compliant with Pravachol ? ? ?ROS: Per HPI ? ?Allergies  ?Allergen Reactions  ? Hydrocodone-Acetaminophen   ? Vicodin [Hydrocodone-Acetaminophen] Nausea Only  ?  dizziness  ? Allegra-D [Fexofenadine-Pseudoephed Er] Nausea Only  ? Codeine Other (See Comments)  ?  GI upset; dizziness  ? ?Past Medical History:  ?Diagnosis Date  ? Hyperlipidemia   ? Osteopenia 04/2015 T score -2.4  ? FRAX not calculated  ? ? ?Current Outpatient Medications:  ?  Ascorbic Acid (VITAMIN C ADULT GUMMIES PO), Take by mouth. Takes 2 daily, Disp: , Rfl:  ?  famotidine (PEPCID) 40 MG tablet, Take 40 mg by mouth daily., Disp: , Rfl:  ?  ibuprofen (ADVIL) 600 MG tablet, Take 1 tablet (600 mg total) by mouth every 8 (eight) hours as needed for moderate pain., Disp: 30 tablet, Rfl: 1 ?  CALCIUM PO, Take by mouth 2 (two) times daily. (Patient not taking: Reported on 02/12/2021), Disp: , Rfl:  ?  pravastatin (PRAVACHOL) 40 MG tablet, TAKE 1 TABLET BY MOUTH EVERY DAY, Disp: 90 tablet, Rfl: 3 ?Social History  ? ?Socioeconomic History  ? Marital status: Married  ?  Spouse name: Not on file  ? Number of children: 0  ? Years of education: Not on file  ? Highest education level: High school graduate  ?Occupational History  ? Occupation: Retired  ?Tobacco Use  ? Smoking status: Never  ? Smokeless tobacco: Never  ?Vaping Use  ? Vaping Use: Never used  ?Substance and Sexual Activity  ? Alcohol use: No  ?  Alcohol/week: 0.0 standard drinks  ? Drug use: No  ? Sexual activity: Yes  ?Other Topics Concern   ? Not on file  ?Social History Narrative  ? Married.    ? ?Social Determinants of Health  ? ?Financial Resource Strain: Not on file  ?Food Insecurity: Not on file  ?Transportation Needs: Not on file  ?Physical Activity: Insufficiently Active  ? Days of Exercise per Week: 3 days  ? Minutes of Exercise per Session: 30 min  ?Stress: No Stress Concern Present  ? Feeling of Stress : Not at all  ?Social Connections: Socially Integrated  ? Frequency of Communication with Friends and Family: More than three times a week  ? Frequency of Social Gatherings with Friends and Family: Three times a week  ? Attends Religious Services: More than 4 times per year  ? Active Member of Clubs or Organizations: Yes  ? Attends CArchivistMeetings: More than 4 times per year  ? Marital Status: Married  ?Intimate Partner Violence: Not on file  ? ?Family History  ?Problem Relation Age of Onset  ? Breast cancer Paternal Aunt   ? Hypertension Mother   ? Dementia Mother   ? Leukemia Father   ? Colon cancer Neg Hx   ? Esophageal cancer Neg Hx   ? Pancreatic cancer Neg Hx   ? Rectal cancer Neg Hx   ? Stomach cancer Neg Hx   ? Ulcerative colitis Neg Hx   ? Crohn's disease Neg Hx   ? ? ?  Objective: ?Office vital signs reviewed. ?BP (!) 167/82   Pulse 85   Temp 98.3 ?F (36.8 ?C)   Ht 5' 4" (1.626 m)   Wt 225 lb 6.4 oz (102.2 kg)   SpO2 95%   BMI 38.69 kg/m?  ? ?Physical Examination:  ?General: Awake, alert, well nourished, No acute distress ?HEENT: Sclera white.  Moist mucous membranes ?Cardio: regular rate and rhythm, S1S2 heard, no murmurs appreciated ?Pulm: clear to auscultation bilaterally, no wheezes, rhonchi or rales; normal work of breathing on room air ? ? ?Assessment/ Plan: ?71 y.o. female  ? ?Mixed hyperlipidemia - Plan: CMP14+EGFR, Lipid panel, pravastatin (PRAVACHOL) 40 MG tablet ? ?Screening, anemia, deficiency, iron - Plan: CBC with Differential/Platelet ? ?White coat syndrome without hypertension ? ?Fasting labs  ordered.  Pravachol renewed ? ?Patient to contact us with home blood pressure readings as she has elevated blood pressure here in office ? ?Orders Placed This Encounter  ?Procedures  ? CBC with Differential/Platelet  ? CMP14+EGFR  ? Lipid panel  ? ?Meds ordered this encounter  ?Medications  ? pravastatin (PRAVACHOL) 40 MG tablet  ?  Sig: TAKE 1 TABLET BY MOUTH EVERY DAY  ?  Dispense:  90 tablet  ?  Refill:  3  ? ? ? ?Rachael Norlander, DO ?[place] ?[phone] ? ?

## 2021-10-11 DIAGNOSIS — M9903 Segmental and somatic dysfunction of lumbar region: Secondary | ICD-10-CM | POA: Diagnosis not present

## 2021-10-11 DIAGNOSIS — M62830 Muscle spasm of back: Secondary | ICD-10-CM | POA: Diagnosis not present

## 2021-10-11 DIAGNOSIS — M9905 Segmental and somatic dysfunction of pelvic region: Secondary | ICD-10-CM | POA: Diagnosis not present

## 2021-10-11 DIAGNOSIS — M9904 Segmental and somatic dysfunction of sacral region: Secondary | ICD-10-CM | POA: Diagnosis not present

## 2021-10-11 LAB — CMP14+EGFR
ALT: 14 IU/L (ref 0–32)
AST: 17 IU/L (ref 0–40)
Albumin/Globulin Ratio: 2.1 (ref 1.2–2.2)
Albumin: 4.1 g/dL (ref 3.8–4.8)
Alkaline Phosphatase: 78 IU/L (ref 44–121)
BUN/Creatinine Ratio: 18 (ref 12–28)
BUN: 14 mg/dL (ref 8–27)
Bilirubin Total: 0.7 mg/dL (ref 0.0–1.2)
CO2: 22 mmol/L (ref 20–29)
Calcium: 9.3 mg/dL (ref 8.7–10.3)
Chloride: 105 mmol/L (ref 96–106)
Creatinine, Ser: 0.78 mg/dL (ref 0.57–1.00)
Globulin, Total: 2 g/dL (ref 1.5–4.5)
Glucose: 88 mg/dL (ref 70–99)
Potassium: 4.3 mmol/L (ref 3.5–5.2)
Sodium: 142 mmol/L (ref 134–144)
Total Protein: 6.1 g/dL (ref 6.0–8.5)
eGFR: 83 mL/min/{1.73_m2} (ref >59)

## 2021-10-11 LAB — CBC WITH DIFFERENTIAL/PLATELET
Basophils Absolute: 0.1 10*3/uL (ref 0.0–0.2)
Basos: 1 %
EOS (ABSOLUTE): 0.2 10*3/uL (ref 0.0–0.4)
Eos: 3 %
Hematocrit: 38.5 % (ref 34.0–46.6)
Hemoglobin: 12.9 g/dL (ref 11.1–15.9)
Immature Grans (Abs): 0 10*3/uL (ref 0.0–0.1)
Immature Granulocytes: 0 %
Lymphocytes Absolute: 1.9 10*3/uL (ref 0.7–3.1)
Lymphs: 40 %
MCH: 28.9 pg (ref 26.6–33.0)
MCHC: 33.5 g/dL (ref 31.5–35.7)
MCV: 86 fL (ref 79–97)
Monocytes Absolute: 0.3 10*3/uL (ref 0.1–0.9)
Monocytes: 6 %
Neutrophils Absolute: 2.3 10*3/uL (ref 1.4–7.0)
Neutrophils: 50 %
Platelets: 244 10*3/uL (ref 150–450)
RBC: 4.46 x10E6/uL (ref 3.77–5.28)
RDW: 13.6 % (ref 11.7–15.4)
WBC: 4.8 10*3/uL (ref 3.4–10.8)

## 2021-10-11 LAB — LIPID PANEL
Chol/HDL Ratio: 2.4 ratio (ref 0.0–4.4)
Cholesterol, Total: 157 mg/dL (ref 100–199)
HDL: 66 mg/dL (ref >39)
LDL Chol Calc (NIH): 78 mg/dL (ref 0–99)
Triglycerides: 64 mg/dL (ref 0–149)
VLDL Cholesterol Cal: 13 mg/dL (ref 5–40)

## 2021-10-11 NOTE — Progress Notes (Signed)
Patient returned call.  Results given and patient understood.  Would like a copy mailed to her. Will send to day. ?

## 2021-10-18 ENCOUNTER — Ambulatory Visit: Payer: Medicare HMO

## 2021-10-18 DIAGNOSIS — M62830 Muscle spasm of back: Secondary | ICD-10-CM | POA: Diagnosis not present

## 2021-10-18 DIAGNOSIS — M9903 Segmental and somatic dysfunction of lumbar region: Secondary | ICD-10-CM | POA: Diagnosis not present

## 2021-10-18 DIAGNOSIS — M9904 Segmental and somatic dysfunction of sacral region: Secondary | ICD-10-CM | POA: Diagnosis not present

## 2021-10-18 DIAGNOSIS — M9905 Segmental and somatic dysfunction of pelvic region: Secondary | ICD-10-CM | POA: Diagnosis not present

## 2021-10-25 ENCOUNTER — Ambulatory Visit (INDEPENDENT_AMBULATORY_CARE_PROVIDER_SITE_OTHER): Payer: Medicare HMO

## 2021-10-25 VITALS — Ht 64.0 in | Wt 225.0 lb

## 2021-10-25 DIAGNOSIS — Z0000 Encounter for general adult medical examination without abnormal findings: Secondary | ICD-10-CM

## 2021-10-25 DIAGNOSIS — M9905 Segmental and somatic dysfunction of pelvic region: Secondary | ICD-10-CM | POA: Diagnosis not present

## 2021-10-25 DIAGNOSIS — M62830 Muscle spasm of back: Secondary | ICD-10-CM | POA: Diagnosis not present

## 2021-10-25 DIAGNOSIS — M9903 Segmental and somatic dysfunction of lumbar region: Secondary | ICD-10-CM | POA: Diagnosis not present

## 2021-10-25 DIAGNOSIS — M9904 Segmental and somatic dysfunction of sacral region: Secondary | ICD-10-CM | POA: Diagnosis not present

## 2021-10-25 NOTE — Patient Instructions (Signed)
Rachael Nguyen , ?Thank you for taking time to come for your Medicare Wellness Visit. I appreciate your ongoing commitment to your health goals. Please review the following plan we discussed and let me know if I can assist you in the future.  ? ?Screening recommendations/referrals: ?Colonoscopy: Done 02/10/2015 Repeat in 10 years ? ?Mammogram: Done 03/10/2021 Repeat annually ? ?Bone Density: Done 04/18/2017 Repeat every 2 years ? ?Recommended yearly ophthalmology/optometry visit for glaucoma screening and checkup ?Recommended yearly dental visit for hygiene and checkup ? ?Vaccinations: ?Influenza vaccine: Declined. ?Pneumococcal vaccine: Discussed. ?Tdap vaccine: Done 05/17/2011 Repeat in 10 years ? ?Shingles vaccine: Discussed.   ?Covid-19:Done 05/13/2020, 09/09/2019 and 08/08/2019 ? ?Advanced directives: Advance directive discussed with you today. Even though you declined this today, please call our office should you change your mind, and we can give you the proper paperwork for you to fill out. ? ? ?Conditions/risks identified: Aim for 30 minutes of exercise or brisk walking, 6-8 glasses of water, and 5 servings of fruits and vegetables each day. ?KEEP UP THE GOOD WORK! ? ?Next appointment: Follow up in one year for your annual wellness visit 2024. ? ? ?Preventive Care 68 Years and Older, Female ?Preventive care refers to lifestyle choices and visits with your health care provider that can promote health and wellness. ?What does preventive care include? ?A yearly physical exam. This is also called an annual well check. ?Dental exams once or twice a year. ?Routine eye exams. Ask your health care provider how often you should have your eyes checked. ?Personal lifestyle choices, including: ?Daily care of your teeth and gums. ?Regular physical activity. ?Eating a healthy diet. ?Avoiding tobacco and drug use. ?Limiting alcohol use. ?Practicing safe sex. ?Taking low-dose aspirin every day. ?Taking vitamin and mineral supplements as  recommended by your health care provider. ?What happens during an annual well check? ?The services and screenings done by your health care provider during your annual well check will depend on your age, overall health, lifestyle risk factors, and family history of disease. ?Counseling  ?Your health care provider may ask you questions about your: ?Alcohol use. ?Tobacco use. ?Drug use. ?Emotional well-being. ?Home and relationship well-being. ?Sexual activity. ?Eating habits. ?History of falls. ?Memory and ability to understand (cognition). ?Work and work Statistician. ?Reproductive health. ?Screening  ?You may have the following tests or measurements: ?Height, weight, and BMI. ?Blood pressure. ?Lipid and cholesterol levels. These may be checked every 5 years, or more frequently if you are over 44 years old. ?Skin check. ?Lung cancer screening. You may have this screening every year starting at age 45 if you have a 30-pack-year history of smoking and currently smoke or have quit within the past 15 years. ?Fecal occult blood test (FOBT) of the stool. You may have this test every year starting at age 43. ?Flexible sigmoidoscopy or colonoscopy. You may have a sigmoidoscopy every 5 years or a colonoscopy every 10 years starting at age 60. ?Hepatitis C blood test. ?Hepatitis B blood test. ?Sexually transmitted disease (STD) testing. ?Diabetes screening. This is done by checking your blood sugar (glucose) after you have not eaten for a while (fasting). You may have this done every 1-3 years. ?Bone density scan. This is done to screen for osteoporosis. You may have this done starting at age 45. ?Mammogram. This may be done every 1-2 years. Talk to your health care provider about how often you should have regular mammograms. ?Talk with your health care provider about your test results, treatment options, and if  necessary, the need for more tests. ?Vaccines  ?Your health care provider may recommend certain vaccines, such  as: ?Influenza vaccine. This is recommended every year. ?Tetanus, diphtheria, and acellular pertussis (Tdap, Td) vaccine. You may need a Td booster every 10 years. ?Zoster vaccine. You may need this after age 58. ?Pneumococcal 13-valent conjugate (PCV13) vaccine. One dose is recommended after age 79. ?Pneumococcal polysaccharide (PPSV23) vaccine. One dose is recommended after age 68. ?Talk to your health care provider about which screenings and vaccines you need and how often you need them. ?This information is not intended to replace advice given to you by your health care provider. Make sure you discuss any questions you have with your health care provider. ?Document Released: 07/17/2015 Document Revised: 03/09/2016 Document Reviewed: 04/21/2015 ?Elsevier Interactive Patient Education ? 2017 [employer]. ? ?Fall Prevention in the Home ?Falls can cause injuries. They can happen to people of all ages. There are many things you can do to make your home safe and to help prevent falls. ?What can I do on the outside of my home? ?Regularly fix the edges of walkways and driveways and fix any cracks. ?Remove anything that might make you trip as you walk through a door, such as a raised step or threshold. ?Trim any bushes or trees on the path to your home. ?Use bright outdoor lighting. ?Clear any walking paths of anything that might make someone trip, such as rocks or tools. ?Regularly check to see if handrails are loose or broken. Make sure that both sides of any steps have handrails. ?Any raised decks and porches should have guardrails on the edges. ?Have any leaves, snow, or ice cleared regularly. ?Use sand or salt on walking paths during winter. ?Clean up any spills in your garage right away. This includes oil or grease spills. ?What can I do in the bathroom? ?Use night lights. ?Install grab bars by the toilet and in the tub and shower. Do not use towel bars as grab bars. ?Use non-skid mats or decals in the tub or  shower. ?If you need to sit down in the shower, use a plastic, non-slip stool. ?Keep the floor dry. Clean up any water that spills on the floor as soon as it happens. ?Remove soap buildup in the tub or shower regularly. ?Attach bath mats securely with double-sided non-slip rug tape. ?Do not have throw rugs and other things on the floor that can make you trip. ?What can I do in the bedroom? ?Use night lights. ?Make sure that you have a light by your bed that is easy to reach. ?Do not use any sheets or blankets that are too big for your bed. They should not hang down onto the floor. ?Have a firm chair that has side arms. You can use this for support while you get dressed. ?Do not have throw rugs and other things on the floor that can make you trip. ?What can I do in the kitchen? ?Clean up any spills right away. ?Avoid walking on wet floors. ?Keep items that you use a lot in easy-to-reach places. ?If you need to reach something above you, use a strong step stool that has a grab bar. ?Keep electrical cords out of the way. ?Do not use floor polish or wax that makes floors slippery. If you must use wax, use non-skid floor wax. ?Do not have throw rugs and other things on the floor that can make you trip. ?What can I do with my stairs? ?Do not leave any items  on the stairs. ?Make sure that there are handrails on both sides of the stairs and use them. Fix handrails that are broken or loose. Make sure that handrails are as long as the stairways. ?Check any carpeting to make sure that it is firmly attached to the stairs. Fix any carpet that is loose or worn. ?Avoid having throw rugs at the top or bottom of the stairs. If you do have throw rugs, attach them to the floor with carpet tape. ?Make sure that you have a light switch at the top of the stairs and the bottom of the stairs. If you do not have them, ask someone to add them for you. ?What else can I do to help prevent falls? ?Wear shoes that: ?Do not have high heels. ?Have  rubber bottoms. ?Are comfortable and fit you well. ?Are closed at the toe. Do not wear sandals. ?If you use a stepladder: ?Make sure that it is fully opened. Do not climb a closed stepladder. ?Make sure that b

## 2021-10-25 NOTE — Progress Notes (Signed)
? ?Subjective:  ? Rachael Nguyen is a 71 y.o. female who presents for Medicare Annual (Subsequent) preventive examination. ?Virtual Visit via Telephone Note ? ?I connected with  Verania Salberg on 10/25/21 at  1:15 PM EDT by telephone and verified that I am speaking with the correct person using two identifiers. ? ?Location: ?Patient: HOME ?Provider: [facility] ?Persons participating in the virtual visit: patient/Nurse Health Advisor ?  ?I discussed the limitations, risks, security and privacy concerns of performing an evaluation and management service by telephone and the availability of in person appointments. The patient expressed understanding and agreed to proceed. ? ?Interactive audio and video telecommunications were attempted between this nurse and patient, however failed, due to patient having technical difficulties OR patient did not have access to video capability.  We continued and completed visit with audio only. ? ?Some vital signs may be absent or patient reported.  ? ?Chriss Driver, LPN ? ?Review of Systems    ? ?Cardiac Risk Factors include: advanced age (>36mn, >>32women);dyslipidemia;sedentary lifestyle;obesity (BMI >30kg/m2) ? ?   ?Objective:  ?  ?Today's Vitals  ? 10/25/21 1317 10/25/21 1319  ?Weight: 225 lb (102.1 kg)   ?Height: '5\' 4"'$  (1.626 m)   ?PainSc:  5   ? ?Body mass index is 38.62 kg/m?. ? ? ?  10/25/2021  ?  1:26 PM 10/15/2020  ? 11:10 AM 10/15/2019  ?  9:49 AM 02/25/2019  ? 12:59 PM 01/28/2015  ?  7:52 AM  ?Advanced Directives  ?Does Patient Have a Medical Advance Directive? No No No No No  ?Does patient want to make changes to medical advance directive?  Yes (MAU/Ambulatory/Procedural Areas - Information given)     ?Would patient like information on creating a medical advance directive? No - Patient declined No - Patient declined Yes (ED - Information included in AVS)  No - patient declined information  ? ? ?Current Medications (verified) ?Outpatient Encounter Medications as of  10/25/2021  ?Medication Sig  ? Ascorbic Acid (VITAMIN C ADULT GUMMIES PO) Take by mouth. Takes 2 daily  ? CALCIUM PO Take by mouth 2 (two) times daily.  ? famotidine (PEPCID) 40 MG tablet Take 40 mg by mouth daily.  ? ibuprofen (ADVIL) 600 MG tablet Take 1 tablet (600 mg total) by mouth every 8 (eight) hours as needed for moderate pain.  ? pravastatin (PRAVACHOL) 40 MG tablet TAKE 1 TABLET BY MOUTH EVERY DAY  ? ?No facility-administered encounter medications on file as of 10/25/2021.  ? ? ?Allergies (verified) ?Hydrocodone-acetaminophen, Vicodin [hydrocodone-acetaminophen], Allegra-d [fexofenadine-pseudoephed er], and Codeine  ? ?History: ?Past Medical History:  ?Diagnosis Date  ? Hyperlipidemia   ? Osteopenia 04/2015 T score -2.4  ? FRAX not calculated  ? ?Past Surgical History:  ?Procedure Laterality Date  ? COMBINED HYSTEROSCOPY DIAGNOSTIC / D&C  2011  ? polyp  ? DENTAL SURGERY    ? 3 teeth extracted  ? ?Family History  ?Problem Relation Age of Onset  ? Breast cancer Paternal Aunt   ? Hypertension Mother   ? Dementia Mother   ? Leukemia Father   ? Colon cancer Neg Hx   ? Esophageal cancer Neg Hx   ? Pancreatic cancer Neg Hx   ? Rectal cancer Neg Hx   ? Stomach cancer Neg Hx   ? Ulcerative colitis Neg Hx   ? Crohn's disease Neg Hx   ? ?Social History  ? ?Socioeconomic History  ? Marital status: Married  ?  Spouse name: Not on  file  ? Number of children: 0  ? Years of education: Not on file  ? Highest education level: High school graduate  ?Occupational History  ? Occupation: Retired  ?Tobacco Use  ? Smoking status: Never  ? Smokeless tobacco: Never  ?Vaping Use  ? Vaping Use: Never used  ?Substance and Sexual Activity  ? Alcohol use: No  ?  Alcohol/week: 0.0 standard drinks  ? Drug use: No  ? Sexual activity: Yes  ?Other Topics Concern  ? Not on file  ?Social History Narrative  ? Married.    ? ?Social Determinants of Health  ? ?Financial Resource Strain: Not on file  ?Food Insecurity: No Food Insecurity  ? Worried  About Charity fundraiser in the Last Year: Never true  ? Ran Out of Food in the Last Year: Never true  ?Transportation Needs: No Transportation Needs  ? Lack of Transportation (Medical): No  ? Lack of Transportation (Non-Medical): No  ?Physical Activity: Inactive  ? Days of Exercise per Week: 0 days  ? Minutes of Exercise per Session: 0 min  ?Stress: No Stress Concern Present  ? Feeling of Stress : Not at all  ?Social Connections: Socially Integrated  ? Frequency of Communication with Friends and Family: More than three times a week  ? Frequency of Social Gatherings with Friends and Family: More than three times a week  ? Attends Religious Services: More than 4 times per year  ? Active Member of Clubs or Organizations: Yes  ? Attends Archivist Meetings: More than 4 times per year  ? Marital Status: Married  ? ? ?Tobacco Counseling ?Counseling given: Not Answered ? ? ?Clinical Intake: ? ?Pre-visit preparation completed: Yes ? ?Pain : 0-10 ?Pain Score: 5  ?Pain Type: Chronic pain ?Pain Location: Back ?Pain Descriptors / Indicators: Aching, Dull ?Pain Onset: More than a month ago ?Pain Frequency: Intermittent ? ?  ? ?BMI - recorded: 38.62 ?Nutritional Status: BMI > 30  Obese ?Nutritional Risks: None ?Diabetes: No ? ?How often do you need to have someone help you when you read instructions, pamphlets, or other written materials from your doctor or pharmacy?: 1 - Never ? ?Diabetic?NO ? ?Interpreter Needed?: No ? ?Information entered by :: mj Alishia Lebo, lpn ? ? ?Activities of Daily Living ? ?  10/25/2021  ?  1:27 PM  ?In your present state of health, do you have any difficulty performing the following activities:  ?Hearing? 0  ?Vision? 0  ?Difficulty concentrating or making decisions? 0  ?Comment memory at times.  ?Walking or climbing stairs? 0  ?Dressing or bathing? 0  ?Doing errands, shopping? 0  ?Preparing Food and eating ? N  ?Using the Toilet? N  ?In the past six months, have you accidently leaked urine? Y   ?Comment at times.  ?Do you have problems with loss of bowel control? N  ?Managing your Medications? N  ?Managing your Finances? N  ?Housekeeping or managing your Housekeeping? N  ? ? ?Patient Care Team: ?[name], DO as PCP - General (Family Medicine) ?Minus Breeding, MD as PCP - Cardiology (Cardiology) ?Allyn Kenner, MD (Dermatology) ? ?Indicate any recent Medical Services you may have received from other than [facility] providers in the past year (date may be approximate). ? ?   ?Assessment:  ? This is a routine wellness examination for Brihany. ? ?Hearing/Vision screen ?Hearing Screening - Comments:: No hearing issues.  ?Vision Screening - Comments:: [facility] 2021. ? ?Dietary issues and exercise activities  discussed: ?Current Exercise Habits: The patient does not participate in regular exercise at present, Exercise limited by: cardiac condition(s);orthopedic condition(s) ? ? Goals Addressed   ? ?  ?  ?  ?  ? This Visit's Progress  ?  DIET - EAT MORE FRUITS AND VEGETABLES   On track  ?  Exercise 150 min/wk Moderate Activity   Not on track  ?  Exercise bike and/or walking 30 minutes each day ?  ? ?  ? ?Depression Screen ? ?  10/25/2021  ?  1:22 PM 04/07/2021  ?  9:05 AM 02/12/2021  ?  1:09 PM 10/15/2020  ? 11:09 AM 09/03/2020  ?  9:38 AM 02/11/2020  ?  9:01 AM 10/01/2019  ? 11:07 AM  ?PHQ 2/9 Scores  ?PHQ - 2 Score 0 0 0 0 0 0 0  ?PHQ- 9 Score      0 0  ?  ?Fall Risk ? ?  10/25/2021  ?  1:27 PM 04/07/2021  ?  9:05 AM 02/12/2021  ?  1:09 PM 09/03/2020  ?  9:38 AM 02/11/2020  ?  9:01 AM  ?Fall Risk   ?Falls in the past year? 0 0 0 0 0  ?Number falls in past yr: 0      ?Injury with Fall? 0      ?Risk for fall due to : Impaired balance/gait      ?Follow up Falls prevention discussed      ? ? ?FALL RISK PREVENTION PERTAINING TO THE HOME: ? ?Any stairs in or around the home? Yes  ?If so, are there any without handrails? No  ?Home free of loose throw rugs in walkways, pet beds, electrical cords, etc? Yes  ?Adequate  lighting in your home to reduce risk of falls? Yes  ? ?ASSISTIVE DEVICES UTILIZED TO PREVENT FALLS: ? ?Life alert? No  ?Use of a cane, walker or w/c? No  ?Grab bars in the bathroom? No  ?Shower chair or bench in

## 2021-12-15 ENCOUNTER — Encounter: Payer: Self-pay | Admitting: Family Medicine

## 2021-12-15 ENCOUNTER — Ambulatory Visit (INDEPENDENT_AMBULATORY_CARE_PROVIDER_SITE_OTHER): Payer: Medicare HMO | Admitting: Family Medicine

## 2021-12-15 VITALS — BP 170/90 | HR 71 | Temp 98.4°F | Ht 64.0 in | Wt 225.0 lb

## 2021-12-15 DIAGNOSIS — I10 Essential (primary) hypertension: Secondary | ICD-10-CM | POA: Insufficient documentation

## 2021-12-15 DIAGNOSIS — R42 Dizziness and giddiness: Secondary | ICD-10-CM

## 2021-12-15 DIAGNOSIS — R350 Frequency of micturition: Secondary | ICD-10-CM | POA: Diagnosis not present

## 2021-12-15 LAB — URINALYSIS, ROUTINE W REFLEX MICROSCOPIC
Bilirubin, UA: NEGATIVE
Glucose, UA: NEGATIVE
Ketones, UA: NEGATIVE
Leukocytes,UA: NEGATIVE
Nitrite, UA: NEGATIVE
Protein,UA: NEGATIVE
RBC, UA: NEGATIVE
Specific Gravity, UA: 1.02 (ref 1.005–1.030)
Urobilinogen, Ur: 1 mg/dL (ref 0.2–1.0)
pH, UA: 7 (ref 5.0–7.5)

## 2021-12-15 MED ORDER — AMLODIPINE BESYLATE 5 MG PO TABS: 5.0000 mg | ORAL_TABLET | Freq: Every day | ORAL | 0 refills | Status: DC

## 2021-12-15 NOTE — Progress Notes (Signed)
Established Patient Office Visit  Subjective   Patient ID: Rachael Nguyen, female    DOB: Aug 31, 1950  Age: 71 y.o. MRN: [id number]  Chief Complaint  Patient presents with   Dizziness    Dizziness This is a new problem. Episode onset: 2 weeks. The problem occurs intermittently. The problem has been unchanged. Pertinent negatives include no abdominal pain, chest pain, chills, congestion, coughing, diaphoresis, fever, headaches, nausea, neck pain, rash, sore throat, swollen glands, vertigo, visual change or vomiting. She has tried nothing for the symptoms. The treatment provided no relief.   She reports that it feels like a lightheadedness.  She has been checking her BP at home and has had had readings of 130s-150s/80-90s. She also reports increased urgency and frequency for the last few days, particularly at night.    Past Medical History:  Diagnosis Date   Hyperlipidemia    Osteopenia 04/2015 T score -2.4   FRAX not calculated      Review of Systems  Constitutional:  Negative for chills, diaphoresis, fever and weight loss.  HENT:  Negative for congestion, ear pain, hearing loss, sinus pain, sore throat and tinnitus.   Eyes:  Negative for blurred vision, double vision and photophobia.  Respiratory:  Negative for cough and wheezing.   Cardiovascular:  Negative for chest pain, palpitations and leg swelling.  Gastrointestinal:  Negative for abdominal pain, diarrhea, heartburn, nausea and vomiting.  Genitourinary:  Positive for frequency and urgency. Negative for dysuria.  Musculoskeletal:  Negative for back pain, joint pain and neck pain.  Skin:  Negative for itching and rash.  Neurological:  Positive for dizziness (upon awaking, sporatic). Negative for vertigo, tremors, sensory change, focal weakness and headaches.  Endo/Heme/Allergies:  Negative for environmental allergies. Does not bruise/bleed easily.  Psychiatric/Behavioral:  Negative for depression, hallucinations,  substance abuse and suicidal ideas. The patient is not nervous/anxious.       Objective:     BP (!) 170/90   Pulse 71   Temp 98.4 F (36.9 C) (Temporal)   Ht '5\' 4"'  (1.626 m)   Wt 102.1 kg   SpO2 97%   BMI 38.62 kg/m  BP Readings from Last 3 Encounters:  12/15/21 (!) 170/90  10/06/21 (!) 167/82  04/07/21 136/71      Physical Exam Constitutional:      Appearance: Normal appearance. She is obese.  HENT:     Head: Normocephalic and atraumatic.     Right Ear: Tympanic membrane, ear canal and external ear normal.     Left Ear: Tympanic membrane, ear canal and external ear normal.     Nose: Nose normal.     Mouth/Throat:     Mouth: Mucous membranes are moist.  Eyes:     Extraocular Movements: Extraocular movements intact.     Conjunctiva/sclera: Conjunctivae normal.  Cardiovascular:     Rate and Rhythm: Normal rate and regular rhythm.     Pulses: Normal pulses.     Heart sounds: Normal heart sounds.  Pulmonary:     Effort: Pulmonary effort is normal.     Breath sounds: Normal breath sounds.  Abdominal:     General: Bowel sounds are normal. There is no distension.     Palpations: Abdomen is soft.     Tenderness: There is no abdominal tenderness.  Musculoskeletal:        General: Normal range of motion.     Cervical back: Normal range of motion and neck supple.     Right lower leg: No  edema.     Left lower leg: No edema.  Skin:    General: Skin is warm and dry.  Neurological:     General: No focal deficit present.     Mental Status: She is alert and oriented to person, place, and time.  Psychiatric:        Mood and Affect: Mood normal.        Behavior: Behavior normal.        Thought Content: Thought content normal.        Judgment: Judgment normal.      No results found for any visits on 12/15/21.  Last CBC Lab Results  Component Value Date   WBC 4.8 10/06/2021   HGB 12.9 10/06/2021   HCT 38.5 10/06/2021   MCV 86 10/06/2021   MCH 28.9 10/06/2021    RDW 13.6 10/06/2021   PLT 244 28/20/6015   Last metabolic panel Lab Results  Component Value Date   GLUCOSE 88 10/06/2021   NA 142 10/06/2021   K 4.3 10/06/2021   CL 105 10/06/2021   CO2 22 10/06/2021   BUN 14 10/06/2021   CREATININE 0.78 10/06/2021   EGFR 83 10/06/2021   CALCIUM 9.3 10/06/2021   PROT 6.1 10/06/2021   ALBUMIN 4.1 10/06/2021   LABGLOB 2.0 10/06/2021   AGRATIO 2.1 10/06/2021   BILITOT 0.7 10/06/2021   ALKPHOS 78 10/06/2021   AST 17 10/06/2021   ALT 14 10/06/2021      The 10-year ASCVD risk score (Arnett DK, et al., 2019) is: 11.3%    Assessment & Plan:   Donicia was seen today for dizziness.  Diagnoses and all orders for this visit:  Primary hypertension Light headedness Last 2 BPs in office have been elevated with elevated home readings. Benign exam today, is reporting some intermittent lightheadedness. Start amlodipine as below. Keep BP log, follow up in 2 weeks with nurse for BP check. Bring BP log and home cuff to visit.  -     amLODipine (NORVASC) 5 MG tablet; Take 1 tablet (5 mg total) by mouth daily.  Urinary frequency Negative UA today.  -     Urinalysis, Routine w reflex microscopic  Follow up in 2 weeks, sooner for new or worsening symptoms.   Kathlen Mody, RN  I personally was present during the history, physical exam, and medical decision-making activities of this service and have verified that the service and findings are accurately documented in the nurse practitioner student's note.  Marjorie Smolder, FNP

## 2021-12-29 ENCOUNTER — Ambulatory Visit (INDEPENDENT_AMBULATORY_CARE_PROVIDER_SITE_OTHER): Payer: Medicare HMO | Admitting: Emergency Medicine

## 2021-12-29 DIAGNOSIS — I10 Essential (primary) hypertension: Secondary | ICD-10-CM

## 2021-12-29 NOTE — Progress Notes (Signed)
LM to RC  

## 2021-12-29 NOTE — Progress Notes (Signed)
Patient informed. 

## 2021-12-29 NOTE — Progress Notes (Signed)
Patient presents for Blood Pressure Check today. Patient brought in home cuff and BP was 159/82. Checked with ours and it was 124/78.   Patient has logged pressures at home and they ranged from 120-130's/ 60-70's

## 2021-12-29 NOTE — Progress Notes (Signed)
For blood pressure.  Have her purchase a new blood pressure cuff as it appears that her blood pressure cuff is not reliable given this 35 point difference in systolic blood pressure

## 2022-01-06 ENCOUNTER — Telehealth: Payer: Self-pay | Admitting: Family Medicine

## 2022-01-06 NOTE — Telephone Encounter (Signed)
Left message to call back  

## 2022-01-06 NOTE — Telephone Encounter (Addendum)
117/60's- Patient has been taking since 12/16/2021. She has noticed the swelling in the ankles and feet since she has been taking medication. She states if she eats with it the lightheadedness is manageable .

## 2022-01-06 NOTE — Telephone Encounter (Signed)
Have her stop the amlodipine and see if symptoms resolve. Monitor BP. If BP is elevated or if symptoms do not resolve, she will need an appt for evaluation.

## 2022-01-06 NOTE — Telephone Encounter (Signed)
What's her BP running? When did she start amlodipine? It looks like lightheadedness has been ongoing for her. She may need another appt for further evaluation.

## 2022-01-07 NOTE — Telephone Encounter (Signed)
Patient aware.  She wanted to go ahead and schedule an appointment on Friday 01/14/22 with Tiffany at 3:00 pm.

## 2022-01-14 ENCOUNTER — Encounter: Payer: Self-pay | Admitting: Family Medicine

## 2022-01-14 ENCOUNTER — Ambulatory Visit (INDEPENDENT_AMBULATORY_CARE_PROVIDER_SITE_OTHER): Payer: Medicare HMO | Admitting: Family Medicine

## 2022-01-14 VITALS — BP 135/79 | HR 72 | Temp 97.8°F | Resp 20 | Ht 64.0 in | Wt 226.0 lb

## 2022-01-14 DIAGNOSIS — I10 Essential (primary) hypertension: Secondary | ICD-10-CM

## 2022-01-14 DIAGNOSIS — I447 Left bundle-branch block, unspecified: Secondary | ICD-10-CM

## 2022-01-14 DIAGNOSIS — R42 Dizziness and giddiness: Secondary | ICD-10-CM

## 2022-01-14 NOTE — Patient Instructions (Signed)
Call Dr. Charlesetta Shanks at [phone]

## 2022-01-14 NOTE — Progress Notes (Signed)
Acute Office Visit  Subjective:     Patient ID: Rachael Nguyen, female    DOB: 1950-10-02, 71 y.o.   MRN: [id number]  Chief Complaint  Patient presents with   Discuss BP meds    Dizziness This is a recurrent problem. The current episode started 1 to 4 weeks ago. The problem occurs intermittently (a few times a week, in the morning for 1-2 hours). The problem has been gradually improving. Pertinent negatives include no abdominal pain, anorexia, arthralgias, change in bowel habit, chest pain, chills, congestion, coughing, diaphoresis, fatigue, fever, headaches, nausea, neck pain, numbness, rash, sore throat, urinary symptoms, vertigo, visual change, vomiting or weakness. Nothing aggravates the symptoms. She has tried drinking, position changes, rest, sleep, relaxation and eating for the symptoms. The treatment provided no relief.   She started on amlopidine and reports that this increased her lightheadedness. She stopped this 8 days ago and has been feeling better since. She reports a history of LBB. She was seen and evaluated by cardiology last year with a normal work up otherwise. She was instructed to follow up prn. She denies palpitations.    Review of Systems  Constitutional:  Negative for chills, diaphoresis, fatigue and fever.  HENT:  Negative for congestion, ear pain and sore throat.   Eyes:  Negative for blurred vision and double vision.  Respiratory:  Negative for cough, shortness of breath and wheezing.   Cardiovascular:  Negative for chest pain, palpitations, orthopnea, claudication, leg swelling and PND.  Gastrointestinal:  Negative for abdominal pain, anorexia, change in bowel habit, nausea and vomiting.  Musculoskeletal:  Negative for arthralgias and neck pain.  Skin:  Negative for rash.  Neurological:  Positive for dizziness. Negative for vertigo, tingling, tremors, sensory change, speech change, focal weakness, seizures, loss of consciousness, weakness, numbness and  headaches.      Objective:    BP 135/79   Pulse 72   Temp 97.8 F (36.6 C) (Temporal)   Resp 20   Ht '5\' 4"'  (1.626 m)   Wt 226 lb (102.5 kg)   SpO2 98%   BMI 38.79 kg/m    Physical Exam Vitals and nursing note reviewed.  Constitutional:      General: She is not in acute distress.    Appearance: She is not ill-appearing, toxic-appearing or diaphoretic.  HENT:     Head: Normocephalic and atraumatic.     Right Ear: Tympanic membrane, ear canal and external ear normal.     Left Ear: Tympanic membrane, ear canal and external ear normal.     Nose: Nose normal.     Mouth/Throat:     Mouth: Mucous membranes are moist.     Pharynx: Oropharynx is clear.  Eyes:     Extraocular Movements: Extraocular movements intact.     Pupils: Pupils are equal, round, and reactive to light.  Neck:     Thyroid: No thyroid mass, thyromegaly or thyroid tenderness.     Vascular: No carotid bruit or JVD.  Cardiovascular:     Rate and Rhythm: Normal rate and regular rhythm.     Heart sounds: Normal heart sounds. No murmur heard. Pulmonary:     Effort: Pulmonary effort is normal. No respiratory distress.     Breath sounds: Normal breath sounds.  Musculoskeletal:     Right lower leg: No edema.     Left lower leg: No edema.  Skin:    General: Skin is warm and dry.  Neurological:     General: No  focal deficit present.     Mental Status: She is alert and oriented to person, place, and time.     Motor: No weakness.     Gait: Gait normal.  Psychiatric:        Mood and Affect: Mood normal.     No results found for any visits on 01/14/22.      Assessment & Plan:   Rachael Nguyen was seen today for discuss bp meds.  Diagnoses and all orders for this visit:  Primary hypertension Discontinue amlodipine as she reports increased lightheadedness with this. BP is normal today, continue to monitor at home and notify for elevated readings.  -     CBC with Differential/Platelet -     CMP14+EGFR -      Thyroid Panel With TSH  Light headedness Benign exam. Denies other symptoms and light headedness is improving. Will check labs as below. She does have a LBB. Discussed to follow up with cardiology as a possible cause of her lightheadedness if lab work up is unremarkable.  -     CBC with Differential/Platelet -     CMP14+EGFR -     Thyroid Panel With TSH  Left bundle branch block   Return if symptoms worsen or fail to improve.  The patient indicates understanding of these issues and agrees with the plan.   Gwenlyn Perking, FNP

## 2022-01-15 LAB — CMP14+EGFR
ALT: 13 IU/L (ref 0–32)
AST: 15 IU/L (ref 0–40)
Albumin/Globulin Ratio: 1.7 (ref 1.2–2.2)
Albumin: 4.1 g/dL (ref 3.9–4.9)
Alkaline Phosphatase: 72 IU/L (ref 44–121)
BUN/Creatinine Ratio: 19 (ref 12–28)
BUN: 14 mg/dL (ref 8–27)
Bilirubin Total: 0.5 mg/dL (ref 0.0–1.2)
CO2: 23 mmol/L (ref 20–29)
Calcium: 9.4 mg/dL (ref 8.7–10.3)
Chloride: 105 mmol/L (ref 96–106)
Creatinine, Ser: 0.72 mg/dL (ref 0.57–1.00)
Globulin, Total: 2.4 g/dL (ref 1.5–4.5)
Glucose: 84 mg/dL (ref 70–99)
Potassium: 4.2 mmol/L (ref 3.5–5.2)
Sodium: 142 mmol/L (ref 134–144)
Total Protein: 6.5 g/dL (ref 6.0–8.5)
eGFR: 91 mL/min/{1.73_m2} (ref >59)

## 2022-01-15 LAB — CBC WITH DIFFERENTIAL/PLATELET
Basophils Absolute: 0.1 10*3/uL (ref 0.0–0.2)
Basos: 1 %
EOS (ABSOLUTE): 0.2 10*3/uL (ref 0.0–0.4)
Eos: 3 %
Hematocrit: 39.4 % (ref 34.0–46.6)
Hemoglobin: 13.2 g/dL (ref 11.1–15.9)
Immature Grans (Abs): 0 10*3/uL (ref 0.0–0.1)
Immature Granulocytes: 0 %
Lymphocytes Absolute: 2.3 10*3/uL (ref 0.7–3.1)
Lymphs: 36 %
MCH: 29.1 pg (ref 26.6–33.0)
MCHC: 33.5 g/dL (ref 31.5–35.7)
MCV: 87 fL (ref 79–97)
Monocytes Absolute: 0.5 10*3/uL (ref 0.1–0.9)
Monocytes: 7 %
Neutrophils Absolute: 3.4 10*3/uL (ref 1.4–7.0)
Neutrophils: 53 %
Platelets: 267 10*3/uL (ref 150–450)
RBC: 4.53 x10E6/uL (ref 3.77–5.28)
RDW: 14 % (ref 11.7–15.4)
WBC: 6.3 10*3/uL (ref 3.4–10.8)

## 2022-01-15 LAB — THYROID PANEL WITH TSH
Free Thyroxine Index: 2.1 (ref 1.2–4.9)
T3 Uptake Ratio: 23 % — ABNORMAL LOW (ref 24–39)
T4, Total: 9.2 ug/dL (ref 4.5–12.0)
TSH: 2.37 u[IU]/mL (ref 0.450–4.500)

## 2022-02-15 NOTE — Progress Notes (Unsigned)
Office Visit    Patient Name: Rachael Nguyen Date of Encounter: 02/17/2022  Primary Care Provider:  [name], DO Primary Cardiologist:  Minus Breeding, MD Primary Electrophysiologist: None Mildly recent antibiotic use prior dysfunctional gallbladder  Chief Complaint    Rachael Nguyen is a 71 y.o. female with PMH of LBBB, obesity, HTN, anxiety, HLD, who presents today for complaint of elevated blood pressures and lightheadedness.  Past Medical History    Past Medical History:  Diagnosis Date   Hyperlipidemia    Osteopenia 04/2015 T score -2.4   FRAX not calculated   Past Surgical History:  Procedure Laterality Date   COMBINED HYSTEROSCOPY DIAGNOSTIC / D&C  2011   polyp   DENTAL SURGERY     3 teeth extracted    Allergies  Allergies  Allergen Reactions   Hydrocodone-Acetaminophen    Vicodin [Hydrocodone-Acetaminophen] Nausea Only    dizziness   Allegra-D [Fexofenadine-Pseudoephed Er] Nausea Only   Codeine Other (See Comments)    GI upset; dizziness    History of Present Illness    Rachael Nguyen is a 71 year old female with the above-mentioned past medical history who presents today for evaluation of lightheadedness and hypertension.  She was seen initially by Dr. Percival Spanish in 2022 for complaint of chest pain and tachycardia.  She was found to have new LBBB and 2D echo was performed that revealed normal EF of 60-65%, no RWMA, no LVH, normal LV function, mild dilated LA with normal RA.  Lexiscan Myoview was also completed and was negative for ischemia.  No further work-up was completed at that time.  Patient was advised to monitor blood pressures at home.  Patient is noted to be treated for anxiety by her PCP.  She was started on amlodipine and experienced increased lightheadedness and was discontinued in July 2023.  Rachael Nguyen presents today for evaluation of lightheadedness and elevated blood pressure.  She is here alone for her appointment.  She  states that her lightheadedness is associated primarily with light and does not occur from activity or rising from sitting position.  Her blood pressure today is slightly elevated at 132/80.  She has been monitoring her pressures at home they have ranged from the 120s to 150s over 60s and 70s.  She is currently not exercising and is eating a high sodium diet.  She reports that her husband says she snores at night.  Patient denies chest pain, palpitations, dyspnea, PND, orthopnea, nausea, vomiting, dizziness, syncope, edema, weight gain, or early satiety.  Home Medications   Current Outpatient Medications  Medication Sig Dispense Refill   Ascorbic Acid (VITAMIN C ADULT GUMMIES PO) Take by mouth daily.     cholecalciferol (VITAMIN D3) 25 MCG (1000 UNIT) tablet Take 2,000 Units by mouth daily.     famotidine (PEPCID) 20 MG tablet Take 20 mg by mouth daily.     hydrochlorothiazide (MICROZIDE) 12.5 MG capsule Take 1 capsule (12.5 mg total) by mouth daily. 90 capsule 0   ibuprofen (ADVIL) 600 MG tablet Take 1 tablet (600 mg total) by mouth every 8 (eight) hours as needed for moderate pain. 30 tablet 1   pravastatin (PRAVACHOL) 40 MG tablet TAKE 1 TABLET BY MOUTH EVERY DAY 90 tablet 3   No current facility-administered medications for this visit.     Review of Systems  Please see the history of present illness.    (+) Lightheadedness (+) Nausea  All other systems reviewed and are otherwise negative except as  noted above.  Physical Exam    Wt Readings from Last 3 Encounters:  02/17/22 230 lb 3.2 oz (104.4 kg)  01/14/22 226 lb (102.5 kg)  12/15/21 225 lb (102.1 kg)   VS: Vitals:   02/17/22 0930  BP: 132/80  Pulse: 73  SpO2: 98%  ,Body mass index is 40.78 kg/m.  Constitutional:      Appearance: Healthy appearance. Not in distress.  Neck:     Vascular: JVD normal.  Pulmonary:     Effort: Pulmonary effort is normal.     Breath sounds: No wheezing. No rales. Diminished in the  bases Cardiovascular:     Normal rate. Regular rhythm. Normal S1. Normal S2.      Murmurs: There is no murmur.  Edema:    Peripheral edema absent.  Abdominal:     Palpations: Abdomen is soft non tender. There is no hepatomegaly.  Skin:    General: Skin is warm and dry.  Neurological:     General: No focal deficit present.     Mental Status: Alert and oriented to person, place and time.     Cranial Nerves: Cranial nerves are intact.  EKG/LABS/Other Studies Reviewed     ECG personally reviewed by me today -sinus rhythm with left bundle branch block and rate of 73 with no acute changes.  Lab Results  Component Value Date   WBC 6.3 01/14/2022   HGB 13.2 01/14/2022   HCT 39.4 01/14/2022   MCV 87 01/14/2022   PLT 267 01/14/2022   Lab Results  Component Value Date   CREATININE 0.72 01/14/2022   BUN 14 01/14/2022   NA 142 01/14/2022   K 4.2 01/14/2022   CL 105 01/14/2022   CO2 23 01/14/2022   Lab Results  Component Value Date   ALT 13 01/14/2022   AST 15 01/14/2022   ALKPHOS 72 01/14/2022   BILITOT 0.5 01/14/2022   Lab Results  Component Value Date   CHOL 157 10/06/2021   HDL 66 10/06/2021   LDLCALC 78 10/06/2021   TRIG 64 10/06/2021   CHOLHDL 2.4 10/06/2021    Lab Results  Component Value Date   HGBA1C 5.6 02/11/2020    Assessment & Plan    1.  Essential HTN: -Patient's blood pressure today was elevated at 132/80 and has also been elevated both at home recordings. -We will start HCTZ 12.5 mg daily -Creatinine and potassium were both checked on 01/2022 and are within normal limits. -Patient instructed to record blood pressures for the next 2 weeks and report back to office -She was encouraged to start DASH diet and increase physical activity as tolerated  2.  LBBB: -Patient is currently asymptomatic and EKG with no acute changes -She reports no shortness of breath or lower extremity swelling  3.  Atypical chest pain: -Resolved and patient denies any  ongoing chest pain currently  4.  Lightheadedness: -Patient was recently started on amlodipine and discontinued by PCP due to lightheadedness and increased lower extremity swelling.  She reports today that her swelling is much improved but lightheadedness is still occurring at times. -She reports that it may be associated with lites and is scheduled to see her ophthalmologist in the next few weeks -She was advised to report any increase in lightheadedness following initiation of HCTZ  5.  History of snoring: -Patient reports history of snoring reported by her husband and has been experiencing increase in elevated blood pressures. -We will refer for sleep study to rule out sleep apnea -  Stop bang score is 6   Disposition: Follow-up with Minus Breeding, MD or APP in 1 months    Medication Adjustments/Labs and Tests Ordered: Current medicines are reviewed at length with the patient today.  Concerns regarding medicines are outlined above.   Signed, Mable Fill, Marissa Nestle, NP 02/17/2022, 10:17 AM [place]

## 2022-02-17 ENCOUNTER — Ambulatory Visit (HOSPITAL_BASED_OUTPATIENT_CLINIC_OR_DEPARTMENT_OTHER): Payer: Medicare HMO | Admitting: Family

## 2022-02-17 ENCOUNTER — Other Ambulatory Visit: Payer: Self-pay | Admitting: Family Medicine

## 2022-02-17 ENCOUNTER — Ambulatory Visit: Payer: Medicare HMO | Admitting: Nurse Practitioner

## 2022-02-17 ENCOUNTER — Encounter: Payer: Self-pay | Admitting: Nurse Practitioner

## 2022-02-17 VITALS — BP 132/80 | HR 73 | Ht 63.0 in | Wt 230.2 lb

## 2022-02-17 DIAGNOSIS — R42 Dizziness and giddiness: Secondary | ICD-10-CM

## 2022-02-17 DIAGNOSIS — I447 Left bundle-branch block, unspecified: Secondary | ICD-10-CM | POA: Diagnosis not present

## 2022-02-17 DIAGNOSIS — R0683 Snoring: Secondary | ICD-10-CM | POA: Diagnosis not present

## 2022-02-17 DIAGNOSIS — R072 Precordial pain: Secondary | ICD-10-CM | POA: Diagnosis not present

## 2022-02-17 DIAGNOSIS — Z1231 Encounter for screening mammogram for malignant neoplasm of breast: Secondary | ICD-10-CM

## 2022-02-17 DIAGNOSIS — I10 Essential (primary) hypertension: Secondary | ICD-10-CM | POA: Diagnosis not present

## 2022-02-17 MED ORDER — HYDROCHLOROTHIAZIDE 12.5 MG PO CAPS: 12.5000 mg | ORAL_CAPSULE | Freq: Every day | ORAL | 0 refills | Status: DC

## 2022-02-17 NOTE — Patient Instructions (Signed)
Medication Instructions:  HYDROCHLOROTHIAZIDE 12.5 MG EVERY DAY  *If you need a refill on your cardiac medications before your next appointment, please call your pharmacy*   Lab Work: NONE If you have labs (blood work) drawn today and your tests are completely normal, you will receive your results only by: [place] (if you have MyChart) OR A paper copy in the mail If you have any lab test that is abnormal or we need to change your treatment, we will call you to review the results.   Testing/Procedures: /Your physician has recommended that you have a sleep study. This test records several body functions during sleep, including: brain activity, eye movement, oxygen and carbon dioxide blood levels, heart rate and rhythm, breathing rate and rhythm, the flow of air through your mouth and nose, snoring, body muscle movements, and chest and belly movement.    Follow-Up: At [facility], you and your health needs are our priority.  As part of our continuing mission to provide you with exceptional heart care, we have created designated Provider Care Teams.  These Care Teams include your primary Cardiologist (physician) and Advanced Practice Providers (APPs -  Physician Assistants and Nurse Practitioners) who all work together to provide you with the care you need, when you need it.  We recommend signing up for the patient portal called "MyChart".  Sign up information is provided on this After Visit Summary.  MyChart is used to connect with patients for Virtual Visits (Telemedicine).  Patients are able to view lab/test results, encounter notes, upcoming appointments, etc.  Non-urgent messages can be sent to your provider as well.   To learn more about what you can do with MyChart, go to [email or web address].    Your next appointment:   1 month(s)  The format for your next appointment:   In Person  Provider:   Ambrose Pancoast NP       Other Instructions MONITOR B/P FOR 2 WEEKS AND  CALL READINGS OR SEND VIA [place]   DASH Eating Plan DASH stands for Dietary Approaches to Stop Hypertension. The DASH eating plan is a healthy eating plan that has been shown to: Reduce high blood pressure (hypertension). Reduce your risk for type 2 diabetes, heart disease, and stroke. Help with weight loss. What are tips for following this plan? Reading food labels Check food labels for the amount of salt (sodium) per serving. Choose foods with less than 5 percent of the Daily Value of sodium. Generally, foods with less than 300 milligrams (mg) of sodium per serving fit into this eating plan. To find whole grains, look for the word "whole" as the first word in the ingredient list. Shopping Buy products labeled as "low-sodium" or "no salt added." Buy fresh foods. Avoid canned foods and pre-made or frozen meals. Cooking Avoid adding salt when cooking. Use salt-free seasonings or herbs instead of table salt or sea salt. Check with your health care provider or pharmacist before using salt substitutes. Do not fry foods. Cook foods using healthy methods such as baking, boiling, grilling, roasting, and broiling instead. Cook with heart-healthy oils, such as olive, canola, avocado, soybean, or sunflower oil. Meal planning  Eat a balanced diet that includes: 4 or more servings of fruits and 4 or more servings of vegetables each day. Try to fill one-half of your plate with fruits and vegetables. 6-8 servings of whole grains each day. Less than 6 oz (170 g) of lean meat, poultry, or fish each day. A 3-oz (85-g) serving of  meat is about the same size as a deck of cards. One egg equals 1 oz (28 g). 2-3 servings of low-fat dairy each day. One serving is 1 cup (237 mL). 1 serving of nuts, seeds, or beans 5 times each week. 2-3 servings of heart-healthy fats. Healthy fats called omega-3 fatty acids are found in foods such as walnuts, flaxseeds, fortified milks, and eggs. These fats are also found in  cold-water fish, such as sardines, salmon, and mackerel. Limit how much you eat of: Canned or prepackaged foods. Food that is high in trans fat, such as some fried foods. Food that is high in saturated fat, such as fatty meat. Desserts and other sweets, sugary drinks, and other foods with added sugar. Full-fat dairy products. Do not salt foods before eating. Do not eat more than 4 egg yolks a week. Try to eat at least 2 vegetarian meals a week. Eat more home-cooked food and less restaurant, buffet, and fast food. Lifestyle When eating at a restaurant, ask that your food be prepared with less salt or no salt, if possible. If you drink alcohol: Limit how much you use to: 0-1 drink a day for women who are not pregnant. 0-2 drinks a day for men. Be aware of how much alcohol is in your drink. In the [place] one drink equals one 12 oz bottle of beer (355 mL), one 5 oz glass of wine (148 mL), or one 1 oz glass of hard liquor (44 mL). General information Avoid eating more than 2,300 mg of salt a day. If you have hypertension, you may need to reduce your sodium intake to 1,500 mg a day. Work with your health care provider to maintain a healthy body weight or to lose weight. Ask what an ideal weight is for you. Get at least 30 minutes of exercise that causes your heart to beat faster (aerobic exercise) most days of the week. Activities may include walking, swimming, or biking. Work with your health care provider or dietitian to adjust your eating plan to your individual calorie needs. What foods should I eat? Fruits All fresh, dried, or frozen fruit. Canned fruit in natural juice (without added sugar). Vegetables Fresh or frozen vegetables (raw, steamed, roasted, or grilled). Low-sodium or reduced-sodium tomato and vegetable juice. Low-sodium or reduced-sodium tomato sauce and tomato paste. Low-sodium or reduced-sodium canned vegetables. Grains Whole-grain or whole-wheat bread. Whole-grain or  whole-wheat pasta. Brown rice. [place]. Bulgur. Whole-grain and low-sodium cereals. Pita bread. Low-fat, low-sodium crackers. Whole-wheat flour tortillas. Meats and other proteins Skinless chicken or [place]. Ground chicken or [place]. Pork with fat trimmed off. Fish and seafood. Egg whites. Dried beans, peas, or lentils. Unsalted nuts, nut butters, and seeds. Unsalted canned beans. Lean cuts of beef with fat trimmed off. Low-sodium, lean precooked or cured meat, such as sausages or meat loaves. Dairy Low-fat (1%) or fat-free (skim) milk. Reduced-fat, low-fat, or fat-free cheeses. Nonfat, low-sodium ricotta or cottage cheese. Low-fat or nonfat yogurt. Low-fat, low-sodium cheese. Fats and oils Soft margarine without trans fats. Vegetable oil. Reduced-fat, low-fat, or light mayonnaise and salad dressings (reduced-sodium). Canola, safflower, olive, avocado, soybean, and sunflower oils. Avocado. Seasonings and condiments Herbs. Spices. Seasoning mixes without salt. Other foods Unsalted popcorn and pretzels. Fat-free sweets. The items listed above may not be a complete list of foods and beverages you can eat. Contact a dietitian for more information. What foods should I avoid? Fruits Canned fruit in a light or heavy syrup. Fried fruit. Fruit in cream or butter  sauce. Vegetables Creamed or fried vegetables. Vegetables in a cheese sauce. Regular canned vegetables (not low-sodium or reduced-sodium). Regular canned tomato sauce and paste (not low-sodium or reduced-sodium). Regular tomato and vegetable juice (not low-sodium or reduced-sodium). Angie Fava. Olives. Grains Baked goods made with fat, such as croissants, muffins, or some breads. Dry pasta or rice meal packs. Meats and other proteins Fatty cuts of meat. Ribs. Fried meat. Berniece Salines. Bologna, salami, and other precooked or cured meats, such as sausages or meat loaves. Fat from the back of a pig (fatback). Bratwurst. Salted nuts and seeds. Canned  beans with added salt. Canned or smoked fish. Whole eggs or egg yolks. Chicken or [place] with skin. Dairy Whole or 2% milk, cream, and half-and-half. Whole or full-fat cream cheese. Whole-fat or sweetened yogurt. Full-fat cheese. Nondairy creamers. Whipped toppings. Processed cheese and cheese spreads. Fats and oils Butter. Stick margarine. Lard. Shortening. Ghee. Bacon fat. Tropical oils, such as coconut, palm kernel, or palm oil. Seasonings and condiments Onion salt, garlic salt, seasoned salt, table salt, and sea salt. Worcestershire sauce. Tartar sauce. Barbecue sauce. Teriyaki sauce. Soy sauce, including reduced-sodium. Steak sauce. Canned and packaged gravies. Fish sauce. Oyster sauce. Cocktail sauce. Store-bought horseradish. Ketchup. Mustard. Meat flavorings and tenderizers. Bouillon cubes. Hot sauces. Pre-made or packaged marinades. Pre-made or packaged taco seasonings. Relishes. Regular salad dressings. Other foods Salted popcorn and pretzels. The items listed above may not be a complete list of foods and beverages you should avoid. Contact a dietitian for more information. Where to find more information National Heart, Lung, and Blood Institute: https://wilson-eaton.com/ American Heart Association: www.heart.org Academy of Nutrition and Dietetics: www.eatright.[place]: www.kidney.org Summary The DASH eating plan is a healthy eating plan that has been shown to reduce high blood pressure (hypertension). It may also reduce your risk for type 2 diabetes, heart disease, and stroke. When on the DASH eating plan, aim to eat more fresh fruits and vegetables, whole grains, lean proteins, low-fat dairy, and heart-healthy fats. With the DASH eating plan, you should limit salt (sodium) intake to 2,300 mg a day. If you have hypertension, you may need to reduce your sodium intake to 1,500 mg a day. Work with your health care provider or dietitian to adjust your eating plan to your  individual calorie needs. This information is not intended to replace advice given to you by your health care provider. Make sure you discuss any questions you have with your health care provider. Document Revised: 05/24/2019 Document Reviewed: 05/24/2019 Elsevier Patient Education  [place]

## 2022-03-12 ENCOUNTER — Other Ambulatory Visit: Payer: Self-pay | Admitting: Family Medicine

## 2022-03-12 DIAGNOSIS — I10 Essential (primary) hypertension: Secondary | ICD-10-CM

## 2022-03-13 IMAGING — MG MM DIGITAL SCREENING BILAT W/ TOMO AND CAD
6 of 10 series · 6 of 30 positions shown · non-contrast
Comparison: Previous exam(s).

ACR Breast Density Category a: The breast tissue is almost entirely
fatty.

CLINICAL DATA: Screening.

EXAM:
DIGITAL SCREENING BILATERAL MAMMOGRAM WITH TOMOSYNTHESIS AND CAD
TECHNIQUE: Bilateral screening digital craniocaudal and mediolateral oblique
mammograms were obtained. Bilateral screening digital breast
tomosynthesis was performed. The images were evaluated with
computer-aided detection.

[L MLO synth-2D]
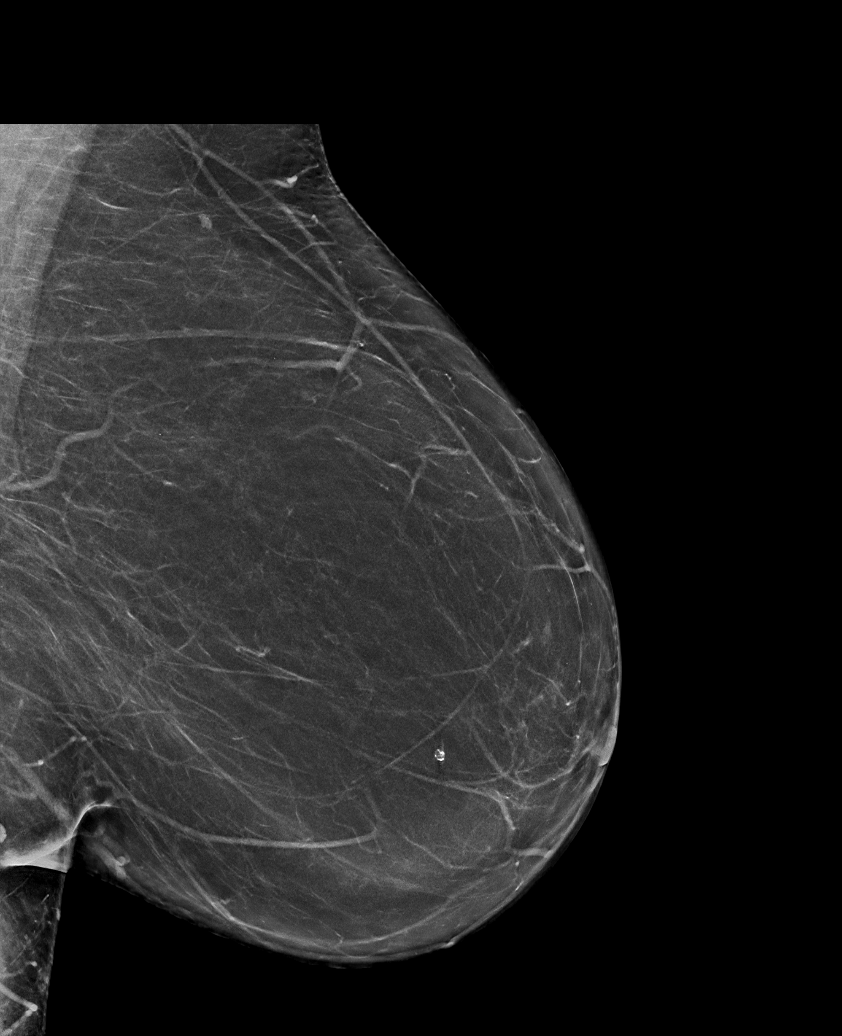

[R MLO synth-2D (1 of 2)]
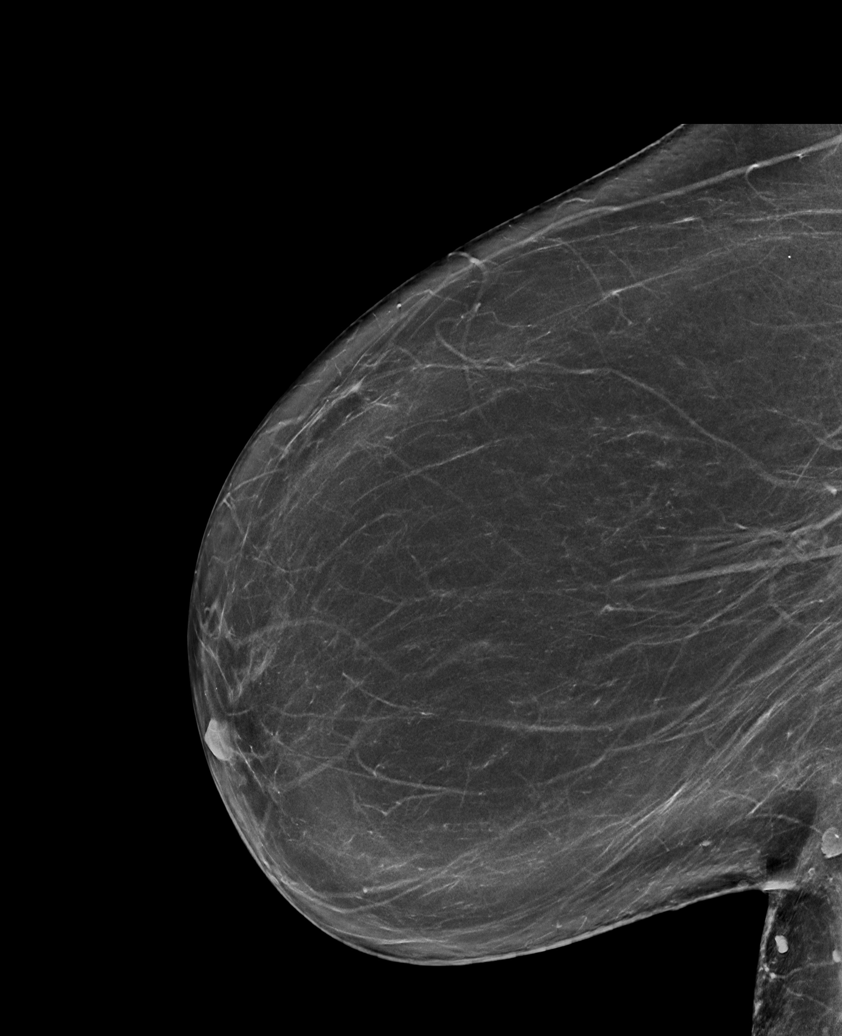

[L CC synth-2D]
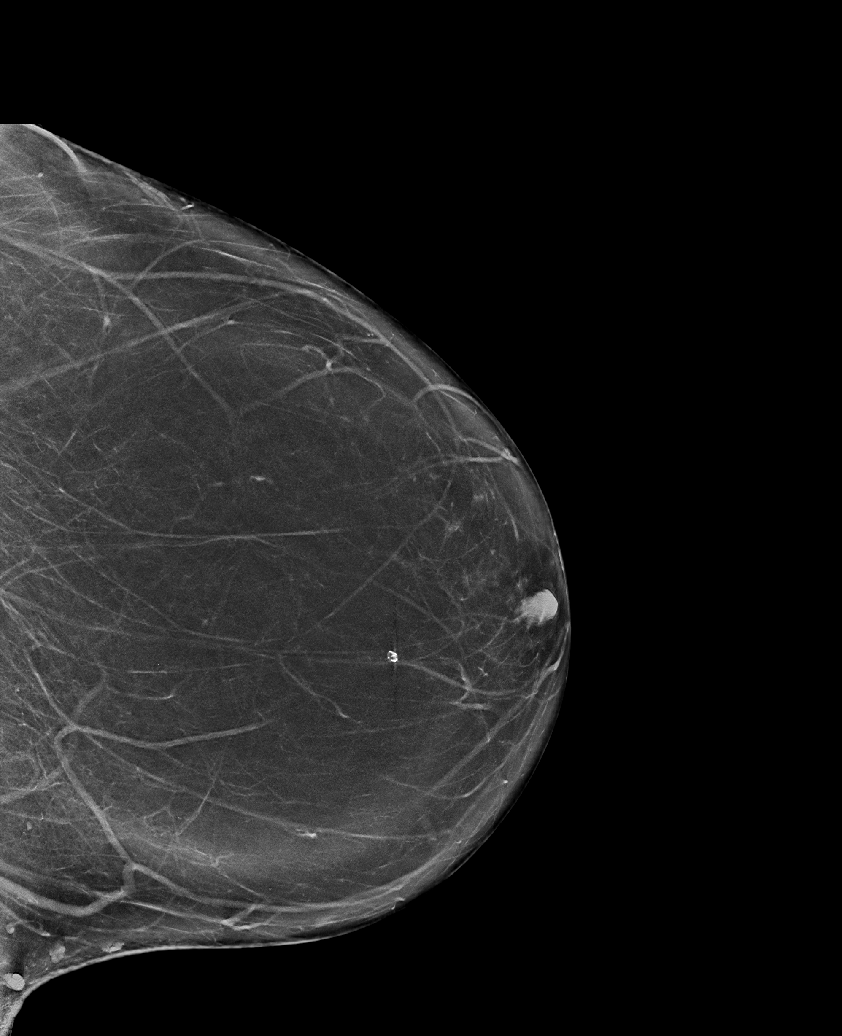

[R CC synth-2D]
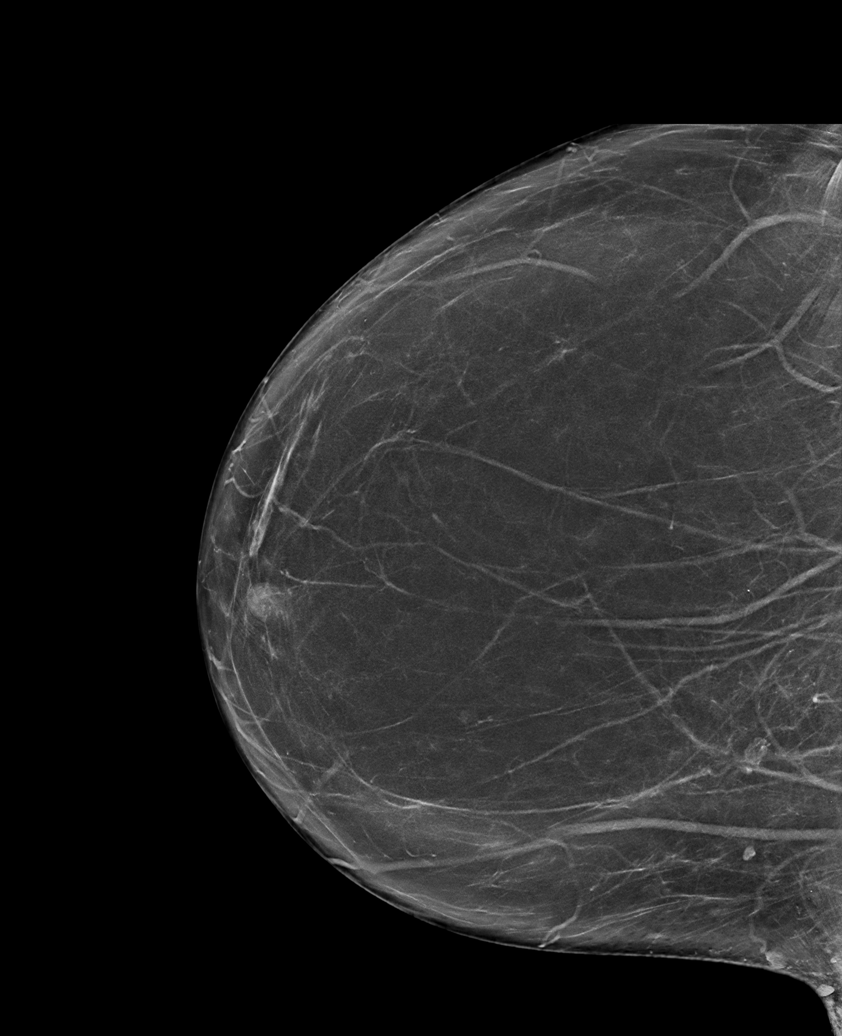

[R MLO synth-2D (2 of 2)]
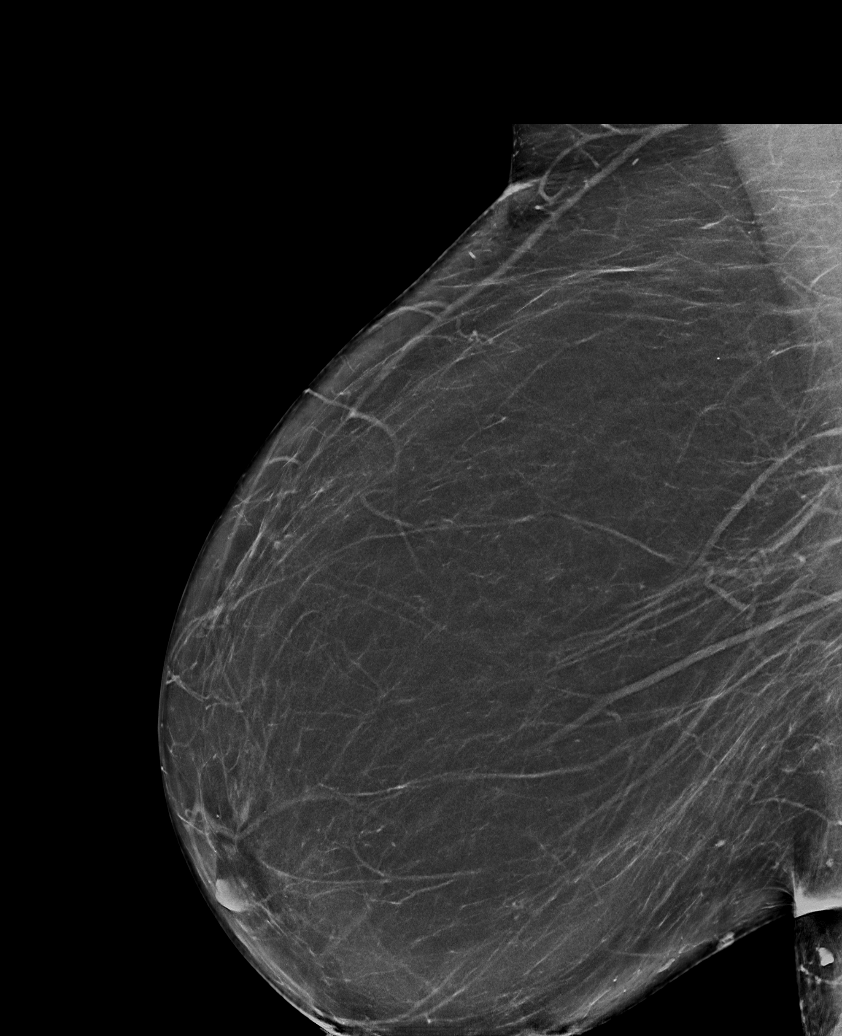

[R MLO tomo · tomo slice 41/80.0]
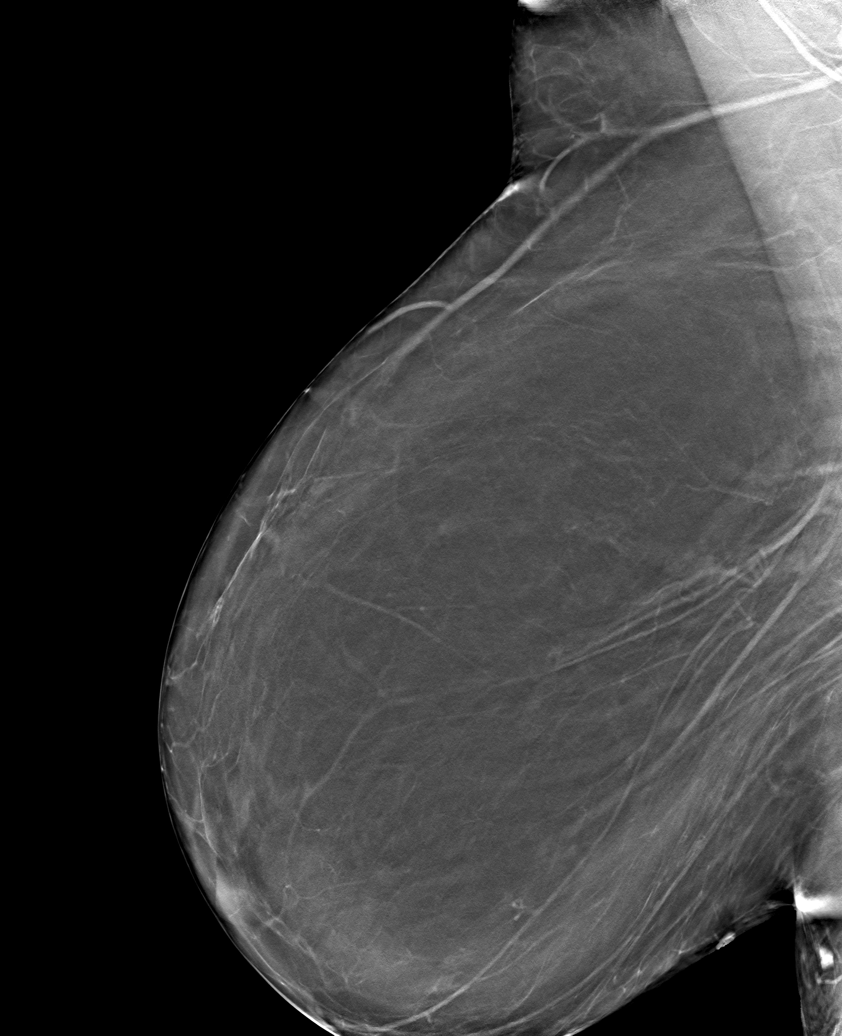

[6 of 30 positions shown; findings below may reference images not displayed]

FINDINGS: There are no findings suspicious for malignancy.
IMPRESSION: No mammographic evidence of malignancy. A result letter of this
screening mammogram will be mailed directly to the patient.

RECOMMENDATION:
Screening mammogram in one year. (Code:0E-3-N98)

BI-RADS CATEGORY  1: Negative.

## 2022-03-20 NOTE — Progress Notes (Unsigned)
Office Visit    Patient Name: Rachael Nguyen Date of Encounter: 03/22/2022  Primary Care Provider:  [name], DO Primary Cardiologist:  Minus Breeding, MD Primary Electrophysiologist: None  Chief Complaint    Rachael Nguyen is a 71 y.o. female with PMH of LBBB, obesity, HTN, anxiety, HLD, who presents today for complaint of elevated blood pressures and lightheadedness who presents today for 1 month follow-up.  Past Medical History    Past Medical History:  Diagnosis Date   Hyperlipidemia    Osteopenia 04/2015 T score -2.4   FRAX not calculated   Past Surgical History:  Procedure Laterality Date   COMBINED HYSTEROSCOPY DIAGNOSTIC / D&C  2011   polyp   DENTAL SURGERY     3 teeth extracted    Allergies  Allergies  Allergen Reactions   Hydrocodone-Acetaminophen    Vicodin [Hydrocodone-Acetaminophen] Nausea Only    dizziness   Allegra-D [Fexofenadine-Pseudoephed Er] Nausea Only   Codeine Other (See Comments)    GI upset; dizziness    History of Present Illness    Rachael Nguyen is a 71 year old female with the above-mentioned past medical history who presents today for evaluation of lightheadedness and hypertension.  She was seen initially by Dr. Percival Spanish in 2022 for complaint of chest pain and tachycardia.  She was found to have new LBBB and 2D echo was performed that revealed normal EF of 60-65%, no RWMA, no LVH, normal LV function, mild dilated LA with normal RA.  Lexiscan Myoview was also completed and was negative for ischemia.  No further work-up was completed at that time.  Patient was advised to monitor blood pressures at home.  Patient is noted to be treated for anxiety by her PCP.  She was started on amlodipine and experienced increased lightheadedness and was discontinued in July 2023.  She was seen most recently 02/2022 for follow-up.  During visit patient's blood pressure was 132/80 and patient had complaints of lightheadedness with rising  from sitting position.  She was started on HCTZ 12.5 mg.  Patient was scheduled to follow-up with ophthalmologist concerning flashing lights.  She was also referred for sleep study due to possible sleep apnea.   Rachael Nguyen presents today for 1 month follow-up of hypertension.  Since last being seen in the office patient reports she has been doing much better and has noticed an improvement in her blood pressures at home.  She has known whitecoat hypertension and blood pressure today was 142/84 in our office and 152/92 on recheck.  These numbers however are not consistent with her home blood pressures which were noted to have systolics in the 606 over 120s.  She is currently tolerating her blood pressure medications and other medicines without any adverse reactions or side effects.  She is currently exercising by stationary bike and is in the process of losing weight.  She was encouraged to continue exercising and watching her calorie intake.  Patient denies chest pain, palpitations, dyspnea, PND, orthopnea, nausea, vomiting, dizziness, syncope, edema, weight gain, or early satiety.   Home Medications    Current Outpatient Medications  Medication Sig Dispense Refill   Ascorbic Acid (VITAMIN C ADULT GUMMIES PO) Take by mouth daily.     cholecalciferol (VITAMIN D3) 25 MCG (1000 UNIT) tablet Take 2,000 Units by mouth daily.     famotidine (PEPCID) 20 MG tablet Take 20 mg by mouth daily.     hydrochlorothiazide (MICROZIDE) 12.5 MG capsule Take 1 capsule (12.5 mg total)  by mouth daily. 90 capsule 0   ibuprofen (ADVIL) 600 MG tablet Take 1 tablet (600 mg total) by mouth every 8 (eight) hours as needed for moderate pain. 30 tablet 1   pravastatin (PRAVACHOL) 40 MG tablet TAKE 1 TABLET BY MOUTH EVERY DAY 90 tablet 3   No current facility-administered medications for this visit.     Review of Systems  Please see the history of present illness.    ( All other systems reviewed and are otherwise negative  except as noted above.  Physical Exam    Wt Readings from Last 3 Encounters:  03/22/22 225 lb 9.6 oz (102.3 kg)  02/17/22 230 lb 3.2 oz (104.4 kg)  01/14/22 226 lb (102.5 kg)   VS: Vitals:   03/22/22 1510 03/22/22 1633  BP: (!) 142/84 (!) 152/92  Pulse: 74   SpO2: 98%   ,Body mass index is 39.96 kg/m.  Constitutional:      Appearance: Healthy appearance. Not in distress.  Neck:     Vascular: JVD normal.  Pulmonary:     Effort: Pulmonary effort is normal.     Breath sounds: No wheezing. No rales. Diminished in the bases Cardiovascular:     Normal rate. Regular rhythm. Normal S1. Normal S2.      Murmurs: There is no murmur.  Edema:    Peripheral edema absent.  Abdominal:     Palpations: Abdomen is soft non tender. There is no hepatomegaly.  Skin:    General: Skin is warm and dry.  Neurological:     General: No focal deficit present.     Mental Status: Alert and oriented to person, place and time.     Cranial Nerves: Cranial nerves are intact.  EKG/LABS/Other Studies Reviewed    ECG personally reviewed by me today -none completed today   STOP-Bang Score:  6        Lab Results  Component Value Date   WBC 6.3 01/14/2022   HGB 13.2 01/14/2022   HCT 39.4 01/14/2022   MCV 87 01/14/2022   PLT 267 01/14/2022   Lab Results  Component Value Date   CREATININE 0.72 01/14/2022   BUN 14 01/14/2022   NA 142 01/14/2022   K 4.2 01/14/2022   CL 105 01/14/2022   CO2 23 01/14/2022   Lab Results  Component Value Date   ALT 13 01/14/2022   AST 15 01/14/2022   ALKPHOS 72 01/14/2022   BILITOT 0.5 01/14/2022   Lab Results  Component Value Date   CHOL 157 10/06/2021   HDL 66 10/06/2021   LDLCALC 78 10/06/2021   TRIG 64 10/06/2021   CHOLHDL 2.4 10/06/2021    Lab Results  Component Value Date   HGBA1C 5.6 02/11/2020    Assessment & Plan    1.  Essential HTN:  HYPERTENSION CONTROL Vitals:   03/22/22 1510 03/22/22 1633  BP: (!) 142/84 (!) 152/92    The  patient's blood pressure is elevated above target today.  In order to address the patient's elevated BP: The blood pressure is usually elevated in clinic.  Blood pressures monitored at home have been optimal.; Blood pressure will be monitored at home to determine if medication changes need to be made.      -Patient's blood pressure today was elevated in office at 142/84 and was 152/92 on recheck -Patient was started on HCTZ 12.5 mg and reports today she is tolerating medication without any side effects. -We will continue current medication at 12.5 mg without  any increase at this time. -BMET today  2.  LBBB: -Patient is currently asymptomatic and EKG with no acute changes -She reports no shortness of breath or lower extremity swelling  3.  Precordial chest pain -Resolved and patient denies any ongoing chest pain currently  4.  Lightheadedness: -Patient was recently started on amlodipine and discontinued by PCP due to lightheadedness and increased lower extremity swelling.  She reports today that her swelling is much improved but lightheadedness is still occurring at times.  5.  History of snoring: -Please see previous STOP-BANG score of 6 -Patient is currently scheduled to have sleep study completed in [facility]    Disposition: Follow-up with Minus Breeding, MD or APP in 4 months   Medication Adjustments/Labs and Tests Ordered: Current medicines are reviewed at length with the patient today.  Concerns regarding medicines are outlined above.   Signed, Mable Fill, Marissa Nestle, NP 03/22/2022, 4:34 PM [place]

## 2022-03-22 ENCOUNTER — Ambulatory Visit: Payer: Medicare HMO | Attending: Nurse Practitioner | Admitting: Nurse Practitioner

## 2022-03-22 ENCOUNTER — Encounter: Payer: Self-pay | Admitting: Nurse Practitioner

## 2022-03-22 VITALS — BP 152/92 | HR 74 | Ht 63.0 in | Wt 225.6 lb

## 2022-03-22 DIAGNOSIS — R072 Precordial pain: Secondary | ICD-10-CM

## 2022-03-22 DIAGNOSIS — I447 Left bundle-branch block, unspecified: Secondary | ICD-10-CM

## 2022-03-22 DIAGNOSIS — R42 Dizziness and giddiness: Secondary | ICD-10-CM

## 2022-03-22 DIAGNOSIS — I10 Essential (primary) hypertension: Secondary | ICD-10-CM | POA: Diagnosis not present

## 2022-03-22 DIAGNOSIS — R0683 Snoring: Secondary | ICD-10-CM | POA: Diagnosis not present

## 2022-03-22 NOTE — Patient Instructions (Signed)
Medication Instructions:  Your physician recommends that you continue on your current medications as directed. Please refer to the Current Medication list given to you today.  *If you need a refill on your cardiac medications before your next appointment, please call your pharmacy*   Lab Work: BMET today If you have labs (blood work) drawn today and your tests are completely normal, you will receive your results only by: [place] (if you have MyChart) OR A paper copy in the mail If you have any lab test that is abnormal or we need to change your treatment, we will call you to review the results.   Follow-Up: At [facility], you and your health needs are our priority.  As part of our continuing mission to provide you with exceptional heart care, we have created designated Provider Care Teams.  These Care Teams include your primary Cardiologist (physician) and Advanced Practice Providers (APPs -  Physician Assistants and Nurse Practitioners) who all work together to provide you with the care you need, when you need it.  We recommend signing up for the patient portal called "MyChart".  Sign up information is provided on this After Visit Summary.  MyChart is used to connect with patients for Virtual Visits (Telemedicine).  Patients are able to view lab/test results, encounter notes, upcoming appointments, etc.  Non-urgent messages can be sent to your provider as well.   To learn more about what you can do with MyChart, go to [email or web address].    Your next appointment:   4 month(s)  The format for your next appointment:   In Person  Provider:   Minus Breeding, MD    Other Instructions Keep a log of your blood pressure readings over the next week and call and report the readings to [place].  Important Information About Sugar      Your physician recommends that you continue on your current medications as directed. Please refer to the Current Medication list given to  you today.

## 2022-03-23 LAB — BASIC METABOLIC PANEL
BUN/Creatinine Ratio: 15 (ref 12–28)
BUN: 13 mg/dL (ref 8–27)
CO2: 24 mmol/L (ref 20–29)
Calcium: 9.3 mg/dL (ref 8.7–10.3)
Chloride: 103 mmol/L (ref 96–106)
Creatinine, Ser: 0.84 mg/dL (ref 0.57–1.00)
Glucose: 95 mg/dL (ref 70–99)
Potassium: 4.2 mmol/L (ref 3.5–5.2)
Sodium: 142 mmol/L (ref 134–144)
eGFR: 76 mL/min/{1.73_m2} (ref >59)

## 2022-03-29 ENCOUNTER — Telehealth: Payer: Self-pay | Admitting: Nurse Practitioner

## 2022-03-29 NOTE — Telephone Encounter (Signed)
Spoke to patient Ambrose Pancoast NP's advice given.

## 2022-03-29 NOTE — Telephone Encounter (Signed)
Spoke to patient she was calling to report B/P readings to Ambrose Pancoast NP.Advised I will send message to him.

## 2022-03-29 NOTE — Telephone Encounter (Signed)
Pt c/o BP issue: STAT if pt c/o blurred vision, one-sided weakness or slurred speech  1. What are your last 5 BP readings?   9/20: 115/71 80 9/21: 109/70 74 9/22: 120/72 71 9/23: 119/71 71 9/24: 113/71 83 9/25: 113/67 80 9/26: 119/78 74          110/66 75  2. Are you having any other symptoms (ex. Dizziness, headache, blurred vision, passed out)?  No   3. What is your BP issue?  Patient is following up to report this past week's BP readings.

## 2022-03-31 ENCOUNTER — Ambulatory Visit
Admission: RE | Admit: 2022-03-31 | Discharge: 2022-03-31 | Disposition: A | Discharge status: Home / Self Care | Payer: Medicare HMO | Source: Ambulatory Visit | Attending: Family Medicine | Admitting: Family Medicine

## 2022-03-31 DIAGNOSIS — Z1231 Encounter for screening mammogram for malignant neoplasm of breast: Secondary | ICD-10-CM | POA: Diagnosis not present

## 2022-04-06 DIAGNOSIS — H43811 Vitreous degeneration, right eye: Secondary | ICD-10-CM | POA: Diagnosis not present

## 2022-04-06 DIAGNOSIS — H2513 Age-related nuclear cataract, bilateral: Secondary | ICD-10-CM | POA: Diagnosis not present

## 2022-04-11 ENCOUNTER — Ambulatory Visit (INDEPENDENT_AMBULATORY_CARE_PROVIDER_SITE_OTHER): Payer: Medicare HMO | Admitting: Family Medicine

## 2022-04-11 ENCOUNTER — Encounter: Payer: Self-pay | Admitting: Family Medicine

## 2022-04-11 VITALS — BP 139/79 | HR 73 | Temp 98.8°F | Ht 63.0 in | Wt 226.0 lb

## 2022-04-11 DIAGNOSIS — Z2821 Immunization not carried out because of patient refusal: Secondary | ICD-10-CM

## 2022-04-11 DIAGNOSIS — Z0000 Encounter for general adult medical examination without abnormal findings: Secondary | ICD-10-CM

## 2022-04-11 DIAGNOSIS — E782 Mixed hyperlipidemia: Secondary | ICD-10-CM

## 2022-04-11 DIAGNOSIS — Z0001 Encounter for general adult medical examination with abnormal findings: Secondary | ICD-10-CM | POA: Diagnosis not present

## 2022-04-11 DIAGNOSIS — I10 Essential (primary) hypertension: Secondary | ICD-10-CM

## 2022-04-11 DIAGNOSIS — Z6841 Body Mass Index (BMI) 40.0 and over, adult: Secondary | ICD-10-CM | POA: Diagnosis not present

## 2022-04-11 DIAGNOSIS — M85851 Other specified disorders of bone density and structure, right thigh: Secondary | ICD-10-CM

## 2022-04-11 MED ORDER — PRAVASTATIN SODIUM 40 MG PO TABS: ORAL_TABLET | ORAL | 3 refills | Status: DC

## 2022-04-11 MED ORDER — HYDROCHLOROTHIAZIDE 12.5 MG PO CAPS: 12.5000 mg | ORAL_CAPSULE | Freq: Every day | ORAL | 3 refills | Status: DC

## 2022-04-11 NOTE — Patient Instructions (Signed)
Osteopenia  Osteopenia is a loss of thickness (density) inside the bones. Another name for osteopenia is low bone mass. Mild osteopenia is a normal part of aging. It is not a disease, and it does not cause symptoms. However, if you have osteopenia and continue to lose bone mass, you could develop a condition that causes the bones to become thin and break more easily (osteoporosis). Osteoporosis can cause you to lose some height, have back pain, and have a stooped posture. Although osteopenia is not a disease, making changes to your lifestyle and diet can help to prevent osteopenia from developing into osteoporosis. What are the causes? Osteopenia is caused by loss of calcium in the bones. Bones are constantly changing. Old bone cells are continually being replaced with new bone cells. This process builds new bone. The mineral calcium is needed to build new bone and maintain bone density. Bone density is usually highest around age 35. After that, most people's bodies cannot replace all the bone they have lost with new bone. What increases the risk? You are more likely to develop this condition if: You are older than age 50. You are a woman who went through menopause early. You have a long illness that keeps you in bed. You do not get enough exercise. You lack certain nutrients (malnutrition). You have an overactive thyroid gland (hyperthyroidism). You use products that contain nicotine or tobacco, such as cigarettes, e-cigarettes and chewing tobacco, or you drink a lot of alcohol. You are taking medicines that weaken the bones, such as steroids. What are the signs or symptoms? This condition does not cause any symptoms. You may have a slightly higher risk for bone breaks (fractures), so getting fractures more easily than normal may be an indication of osteopenia. How is this diagnosed? This condition may be diagnosed based on an X-ray exam that measures bone density (dual-energy X-ray  absorptiometry, or DEXA). This test can measure bone density in your hips, spine, and wrists. Osteopenia has no symptoms, so this condition is usually diagnosed after a routine bone density screening test is done for osteoporosis. This routine screening is usually done for: Women who are age 65 or older. Men who are age 70 or older. If you have risk factors for osteopenia, you may have the screening test at an earlier age. How is this treated? Making dietary and lifestyle changes can lower your risk for osteoporosis. If you have severe osteopenia that is close to becoming osteoporosis, this condition can be treated with medicines and dietary supplements such as calcium and vitamin D. These supplements help to rebuild bone density. Follow these instructions at home: Eating and drinking Eat a diet that is high in calcium and vitamin D. Calcium is found in dairy products, beans, salmon, and leafy green vegetables like spinach and broccoli. Look for foods that have vitamin D and calcium added to them (fortified foods), such as orange juice, cereal, and bread.  Lifestyle Do 30 minutes or more of a weight-bearing exercise every day, such as walking, jogging, or playing a sport. These types of exercises strengthen the bones. Do not use any products that contain nicotine or tobacco, such as cigarettes, e-cigarettes, and chewing tobacco. If you need help quitting, ask your health care provider. Do not drink alcohol if: Your health care provider tells you not to drink. You are pregnant, may be pregnant, or are planning to become pregnant. If you drink alcohol: Limit how much you use to: 0-1 drink a day for women. 0-2   drinks a day for men. Be aware of how much alcohol is in your drink. In the [place] one drink equals one 12 oz bottle of beer (355 mL), one 5 oz glass of wine (148 mL), or one 1 oz glass of hard liquor (44 mL). General instructions Take over-the-counter and prescription medicines only as  told by your health care provider. These include vitamins and supplements. Take precautions at home to lower your risk of falling, such as: Keeping rooms well-lit and free of clutter, such as cords. Installing safety rails on stairs. Using rubber mats in the bathroom or other areas that are often wet or slippery. Keep all follow-up visits. This is important. Contact a health care provider if: You have not had a bone density screening for osteoporosis and you are: A woman who is age 65 or older. A man who is age 70 or older. You are a postmenopausal woman who has not had a bone density screening for osteoporosis. You are older than age 50 and you want to know if you should have bone density screening for osteoporosis. Summary Osteopenia is a loss of thickness (density) inside the bones. Another name for osteopenia is low bone mass. Osteopenia is not a disease, but it may increase your risk for a condition that causes the bones to become thin and break more easily (osteoporosis). You may be at risk for osteopenia if you are older than age 50 or if you are a woman who went through early menopause. Osteopenia does not cause any symptoms, but it can be diagnosed with a bone density screening test. Dietary and lifestyle changes are the first treatment for osteopenia. These may lower your risk for osteoporosis. This information is not intended to replace advice given to you by your health care provider. Make sure you discuss any questions you have with your health care provider. Document Revised: 12/05/2019 Document Reviewed: 12/05/2019 Elsevier Patient Education  2023 Elsevier Inc.  

## 2022-04-11 NOTE — Progress Notes (Signed)
Rachael Nguyen is a 71 y.o. female presents to office today for annual physical exam examination.    Concerns today include: 1.  Hypertension associated with obesity Patient is scheduled for a sleep study soon.  She saw her heart doctor and she was switched from calcium channel blocker to hydrochlorothiazide.  She has not really noticed a great deal of increase in her urination but does note that she lost about 10 pounds after being started on it.  She continues to struggle with weight and states that she recently started a program where she does exercise in a seated position but this caused some back pain after completion.  She is not sure what exercises she can do but notes that she does want to lose weight.  She really does not want to be on any medications to help her lose weight  Diet: Typical American, Exercise: Started new program as above Last colonoscopy: Up-to-date Last mammogram: Up-to-date Last pap smear: N/A Refills needed today: All Immunizations needed: PNA, shingles, tetanus but she declines all of these Immunization History  Administered Date(s) Administered   Moderna Sars-Covid-2 Vaccination 08/08/2019, 09/09/2019, 05/13/2020   Tdap 05/17/2011     Past Medical History:  Diagnosis Date   Hyperlipidemia    Osteopenia 04/2015 T score -2.4   FRAX not calculated   Social History   Socioeconomic History   Marital status: Married    Spouse name: Not on file   Number of children: 0   Years of education: Not on file   Highest education level: High school graduate  Occupational History   Occupation: Retired  Tobacco Use   Smoking status: Never   Smokeless tobacco: Never  Vaping Use   Vaping Use: Never used  Substance and Sexual Activity   Alcohol use: No    Alcohol/week: 0.0 standard drinks of alcohol   Drug use: No   Sexual activity: Yes  Other Topics Concern   Not on file  Social History Narrative   Married.     Social Determinants of Health    Financial Resource Strain: Not on file  Food Insecurity: No Food Insecurity (10/25/2021)   Hunger Vital Sign    Worried About Running Out of Food in the Last Year: Never true    Ran Out of Food in the Last Year: Never true  Transportation Needs: No Transportation Needs (10/25/2021)   PRAPARE - Hydrologist (Medical): No    Lack of Transportation (Non-Medical): No  Physical Activity: Inactive (10/25/2021)   Exercise Vital Sign    Days of Exercise per Week: 0 days    Minutes of Exercise per Session: 0 min  Stress: No Stress Concern Present (10/25/2021)   [place]    Feeling of Stress : Not at all  Social Connections: [place] (10/25/2021)   Social Connection and Isolation Panel [NHANES]    Frequency of Communication with Friends and Family: More than three times a week    Frequency of Social Gatherings with Friends and Family: More than three times a week    Attends Religious Services: More than 4 times per year    Active Member of [employer] or Organizations: Yes    Attends Archivist Meetings: More than 4 times per year    Marital Status: Married  Human resources officer Violence: Not At Risk (10/25/2021)   Humiliation, Afraid, Rape, and Kick questionnaire    Fear of Current or Ex-Partner:  No    Emotionally Abused: No    Physically Abused: No    Sexually Abused: No   Past Surgical History:  Procedure Laterality Date   COMBINED HYSTEROSCOPY DIAGNOSTIC / D&C  2011   polyp   DENTAL SURGERY     3 teeth extracted   Family History  Problem Relation Age of Onset   Breast cancer Paternal Aunt    Hypertension Mother    Dementia Mother    Leukemia Father    Colon cancer Neg Hx    Esophageal cancer Neg Hx    Pancreatic cancer Neg Hx    Rectal cancer Neg Hx    Stomach cancer Neg Hx    Ulcerative colitis Neg Hx    Crohn's disease Neg Hx     Current Outpatient Medications:     Ascorbic Acid (VITAMIN C ADULT GUMMIES PO), Take by mouth daily., Disp: , Rfl:    cholecalciferol (VITAMIN D3) 25 MCG (1000 UNIT) tablet, Take 2,000 Units by mouth daily., Disp: , Rfl:    famotidine (PEPCID) 20 MG tablet, Take 20 mg by mouth daily., Disp: , Rfl:    hydrochlorothiazide (MICROZIDE) 12.5 MG capsule, Take 1 capsule (12.5 mg total) by mouth daily., Disp: 90 capsule, Rfl: 0   ibuprofen (ADVIL) 600 MG tablet, Take 1 tablet (600 mg total) by mouth every 8 (eight) hours as needed for moderate pain., Disp: 30 tablet, Rfl: 1   pravastatin (PRAVACHOL) 40 MG tablet, TAKE 1 TABLET BY MOUTH EVERY DAY, Disp: 90 tablet, Rfl: 3  Allergies  Allergen Reactions   Hydrocodone-Acetaminophen    Vicodin [Hydrocodone-Acetaminophen] Nausea Only    dizziness   Allegra-D [Fexofenadine-Pseudoephed Er] Nausea Only   Codeine Other (See Comments)    GI upset; dizziness     ROS: Review of Systems A comprehensive review of systems was negative except for: Ears, nose, mouth, throat, and face: positive for dry nasal passages/ dry mouth and lips Musculoskeletal: positive for back pain    Physical exam BP 139/79   Pulse 73   Temp 98.8 F (37.1 C)   Ht '5\' 3"'$  (1.6 m)   Wt 226 lb (102.5 kg)   SpO2 98%   BMI 40.03 kg/m  General appearance: alert, cooperative, appears stated age, no distress, and morbidly obese Head: Normocephalic, without obvious abnormality, atraumatic Eyes: negative findings: lids and lashes normal, conjunctivae and sclerae normal, corneas clear, and pupils equal, round, reactive to light and accomodation Ears: normal TM's and external ear canals both ears Nose:  Dry nasal passages.  No purulence Throat: lips, mucosa, and tongue normal; teeth and gums normal Neck: no adenopathy, no carotid bruit, supple, symmetrical, trachea midline, and thyroid not enlarged, symmetric, no tenderness/mass/nodules Back: symmetric, no curvature. ROM normal. No CVA tenderness. Lungs: clear to  auscultation bilaterally Heart: regular rate and rhythm, S1, S2 normal, no murmur, click, rub or gallop Abdomen: soft, non-tender; bowel sounds normal; no masses,  no organomegaly Extremities: extremities normal, atraumatic, no cyanosis or edema Pulses: 2+ and symmetric Skin: Skin color, texture, turgor normal. No rashes or lesions Lymph nodes: Cervical, supraclavicular, and axillary nodes normal. Neurologic: Grossly normal Psych: Mood stable, speech normal, affect appropriate   Assessment/ Plan: [name] here for annual physical exam.   Annual physical exam  Primary hypertension - Plan: hydrochlorothiazide (MICROZIDE) 12.5 MG capsule  Mixed hyperlipidemia - Plan: pravastatin (PRAVACHOL) 40 MG tablet  Morbid obesity (HCC)  Osteopenia of neck of right femur - Plan: DG [facility] DEXA  Refuses tetanus,  diphtheria, and acellular pertussis (Tdap) vaccination  Influenza vaccination declined by patient  Pneumococcal vaccination declined by patient  Blood pressure is controlled upon recheck.  Continue with current regimen of hydrochlorothiazide.  This has been renewed  Up-to-date on labs.  These were not collected again today.  Continue pravastatin  We discussed ways to improve weight and back pain, particularly given central adiposity.  I 100% agree with pursuing sleep study as she does have symptoms suspicious for OSA.  We discussed the association of OSA with cardiovascular events including atrial fibrillation, hypertrophy of the heart, stroke.  DEXA scan ordered for follow-up on osteopenia  Counseled on healthy lifestyle choices, including diet (rich in fruits, vegetables and lean meats and low in salt and simple carbohydrates) and exercise (at least 30 minutes of moderate physical activity daily).  Patient to follow up in 1 year for annual exam or sooner if needed.  Yameli Delamater M. Lajuana Ripple, DO

## 2022-04-20 ENCOUNTER — Encounter: Payer: Medicare HMO | Admitting: Cardiovascular Disease

## 2022-04-26 ENCOUNTER — Ambulatory Visit: Payer: Medicare HMO | Attending: Nurse Practitioner | Admitting: Cardiology

## 2022-04-26 DIAGNOSIS — G4736 Sleep related hypoventilation in conditions classified elsewhere: Secondary | ICD-10-CM | POA: Diagnosis not present

## 2022-04-26 DIAGNOSIS — G4733 Obstructive sleep apnea (adult) (pediatric): Secondary | ICD-10-CM | POA: Diagnosis not present

## 2022-04-26 DIAGNOSIS — I10 Essential (primary) hypertension: Secondary | ICD-10-CM | POA: Insufficient documentation

## 2022-04-26 DIAGNOSIS — R0683 Snoring: Secondary | ICD-10-CM | POA: Diagnosis not present

## 2022-04-28 NOTE — Procedures (Signed)
   Patient Name: Rachael Nguyen, Rachael Nguyen Date:04/26/2022 Gender: Female D.O.B: 07-06-1950 Age (years): 65 Referring Provider: Tempie Donning NP Height (inches): 105 Interpreting Physician: Fransico Him MD, ABSM Weight (lbs): 226 RPSGT: Rosebud Poles BMI: 40 MRN: [id number] Neck Size: 14.00  CLINICAL INFORMATION Sleep Study Type: NPSG  Indication for sleep study: N/A  Epworth Sleepiness Score: 6  SLEEP STUDY TECHNIQUE As per the AASM Manual for the Scoring of Sleep and Associated Events v2.3 (April 2016) with a hypopnea requiring 4% desaturations.  The channels recorded and monitored were frontal, central and occipital EEG, electrooculogram (EOG), submentalis EMG (chin), nasal and oral airflow, thoracic and abdominal wall motion, anterior tibialis EMG, snore microphone, electrocardiogram, and pulse oximetry.  MEDICATIONS Medications self-administered by patient taken the night of the study : N/A  SLEEP ARCHITECTURE The study was initiated at 10:26:05 PM and ended at 5:11:04 AM.  Sleep onset time was 43.3 minutes and the sleep efficiency was 66.4%. The total sleep time was 269 minutes.  Stage REM latency was 343.5 minutes.  The patient spent 3.90% of the night in stage N1 sleep, 55.76% in stage N2 sleep, 33.64% in stage N3 and 6.7% in REM.  Alpha intrusion was absent.  Supine sleep was 6.88%.  RESPIRATORY PARAMETERS The overall apnea/hypopnea index (AHI) was 24.1 per hour. There were 13 total apneas, including 8 obstructive, 5 central and 0 mixed apneas. There were 95 hypopneas and 38 RERAs.  The AHI during Stage REM sleep was 46.7 per hour.  AHI while supine was 0.0 per hour.  The mean oxygen saturation was 91.12%. The minimum SpO2 during sleep was 81.00%.  moderate snoring was noted during this study.  CARDIAC DATA The 2 lead EKG demonstrated sinus rhythm. The mean heart rate was 64.73 beats per minute. Other EKG findings include: PVCs.  LEG MOVEMENT DATA The total  PLMS were 53 with a resulting PLMS index of 11.82. Associated arousal with leg movement index was 3.6 .  IMPRESSIONS - Moderate obstructive sleep apnea occurred during this study (AHI = 24.1/h). - No significant central sleep apnea occurred during this study (CAI = 1.1/h). - Mild oxygen desaturation was noted during this study (Min O2 = 81.00%). - The patient snored with moderate snoring volume. - EKG findings include PVCs. - Mild periodic limb movements of sleep occurred during the study. No significant associated arousals.  DIAGNOSIS - Obstructive Sleep Apnea (G47.33) - Nocturnal Hypoxemia (G47.36)  RECOMMENDATIONS - Therapeutic CPAP titration to determine optimal pressure required to alleviate sleep disordered breathing. - Avoid alcohol, sedatives and other CNS depressants that may worsen sleep apnea and disrupt normal sleep architecture. - Sleep hygiene should be reviewed to assess factors that may improve sleep quality. - Weight management and regular exercise should be initiated or continued if appropriate.  [Electronically signed] 04/28/2022 10:48 PM  Fransico Him MD, ABSM Diplomate, American Board of Sleep Medicine

## 2022-05-03 ENCOUNTER — Ambulatory Visit: Payer: Medicare HMO

## 2022-05-03 DIAGNOSIS — Z0130 Encounter for examination of blood pressure without abnormal findings: Secondary | ICD-10-CM

## 2022-05-03 NOTE — Progress Notes (Signed)
Patient here today for blood pressure check Blood pressure on our machine was 134/68, on her machine it was 159/88.  Recent blood pressure at home have been:  115/71 119/78 104/74 102/66 109/66 122/74 113/64  Yesterday she had two low readings of 97/64 and 95/61 which she is very concerned about.  She also is concerned that she has fluctuations in her blood pressure and does not understand why this is happening.

## 2022-05-23 ENCOUNTER — Telehealth: Payer: Self-pay | Admitting: Family Medicine

## 2022-05-23 NOTE — Telephone Encounter (Signed)
Pt states she smashed finger in between bird bath. Put ice on it to help with swelling. Pt states is was swollen instantly and turned blue. I advised pt otc pain medication and ice for swelling. I let patient know if she needed xray to give [place] a call and she declined. Aware of my name and will call if she needs [place]

## 2022-05-25 ENCOUNTER — Telehealth: Payer: Self-pay | Admitting: *Deleted

## 2022-05-25 NOTE — Telephone Encounter (Signed)
The patient has been notified of the result. Left detailed message on voicemail and informed patient to call back..Kwane Rohl Green, CMA   

## 2022-05-25 NOTE — Telephone Encounter (Signed)
[place]

## 2022-05-25 NOTE — Telephone Encounter (Signed)
Finger is much better and don't need the 4:00 appt. Wants to thank Dr. Darnell Level for checking on her. She is very Patent attorney.

## 2022-05-25 NOTE — Telephone Encounter (Signed)
-----   Message from Lauralee Evener, [place] sent at 04/29/2022  8:57 AM EDT -----  ----- Message ----- From: Sueanne Margarita, MD Sent: 04/28/2022  10:49 PM EDT To: [facility] Sleep Studies  Please let patient know that they have sleep apnea.  Recommend therapeutic CPAP titration for treatment of patient's sleep disordered breathing.  If unable to perform an in lab titration then initiate ResMed auto CPAP from 4 to 15cm H2O with heated humidity and mask of choice and overnight pulse ox on CPAP.

## 2022-06-15 ENCOUNTER — Other Ambulatory Visit (INDEPENDENT_AMBULATORY_CARE_PROVIDER_SITE_OTHER): Payer: Medicare HMO

## 2022-06-15 ENCOUNTER — Encounter: Payer: Self-pay | Admitting: Nurse Practitioner

## 2022-06-15 ENCOUNTER — Telehealth: Payer: Self-pay | Admitting: Family Medicine

## 2022-06-15 ENCOUNTER — Other Ambulatory Visit: Payer: Self-pay | Admitting: Nurse Practitioner

## 2022-06-15 ENCOUNTER — Ambulatory Visit (INDEPENDENT_AMBULATORY_CARE_PROVIDER_SITE_OTHER): Payer: Medicare HMO | Admitting: Nurse Practitioner

## 2022-06-15 DIAGNOSIS — R051 Acute cough: Secondary | ICD-10-CM

## 2022-06-15 DIAGNOSIS — R059 Cough, unspecified: Secondary | ICD-10-CM | POA: Diagnosis not present

## 2022-06-15 MED ORDER — GUAIFENESIN ER 600 MG PO TB12: 600.0000 mg | ORAL_TABLET | Freq: Two times a day (BID) | ORAL | 0 refills | Status: DC

## 2022-06-15 MED ORDER — BENZONATATE 100 MG PO CAPS: 100.0000 mg | ORAL_CAPSULE | Freq: Three times a day (TID) | ORAL | 0 refills | Status: DC | PRN

## 2022-06-15 MED ORDER — PREDNISONE 10 MG (21) PO TBPK: ORAL_TABLET | ORAL | 0 refills | Status: DC

## 2022-06-15 NOTE — Patient Instructions (Signed)

## 2022-06-15 NOTE — Telephone Encounter (Signed)
Prednisone sent to pharmacy

## 2022-06-15 NOTE — Progress Notes (Signed)
   Virtual Visit  Note Due to COVID-19 pandemic this visit was conducted virtually. This visit type was conducted due to national recommendations for restrictions regarding the COVID-19 Pandemic (e.g. social distancing, sheltering in place) in an effort to limit this patient's exposure and mitigate transmission in our community. All issues noted in this document were discussed and addressed.  A physical exam was not performed with this format.  I connected with Rachael Nguyen on 06/15/22 at 9;10 am  by telephone and verified that I am speaking with the correct person using two identifiers. Rachael Nguyen is currently located at home during visit. The provider, Ivy Lynn, NP is located in their office at time of visit.  I discussed the limitations, risks, security and privacy concerns of performing an evaluation and management service by telephone and the availability of in person appointments. I also discussed with the patient that there may be a patient responsible charge related to this service. The patient expressed understanding and agreed to proceed.   History and Present Illness:  Cough This is a new problem. The current episode started in the past 7 days. The problem has been gradually worsening. The problem occurs constantly. The cough is Non-productive. Pertinent negatives include no chills, ear pain, fever, rash or shortness of breath.      Review of Systems  Constitutional: Negative.  Negative for chills and fever.  HENT:  Negative for congestion, ear discharge and ear pain.   Respiratory:  Positive for cough. Negative for shortness of breath.   Skin: Negative.  Negative for itching and rash.  All other systems reviewed and are negative.    Observations/Objective: Televisit patient not in distress.  Assessment and Plan: Patient presents with unresolved cough in the past 4 to 5 days. Nothing over-the-counter has helped.  Advised patient to Take meds as  prescribed - Use a cool mist humidifier  -Use saline nose sprays frequently -Force fluids -For fever or aches or pains- take Tylenol or ibuprofen. -COVID-19, RSV, flu swab and chest x-ray completed results pending per patient request.  Follow Up Instructions: Follow-up for worsening or resolved symptoms.    I discussed the assessment and treatment plan with the patient. The patient was provided an opportunity to ask questions and all were answered. The patient agreed with the plan and demonstrated an understanding of the instructions.   The patient was advised to call back or seek an in-person evaluation if the symptoms worsen or if the condition fails to improve as anticipated.  The above assessment and management plan was discussed with the patient. The patient verbalized understanding of and has agreed to the management plan. Patient is aware to call the clinic if symptoms persist or worsen. Patient is aware when to return to the clinic for a follow-up visit. Patient educated on when it is appropriate to go to the emergency department.   Time call ended: 9:11 AM  I provided 11 minutes of  non face-to-face time during this encounter.    Ivy Lynn, NP

## 2022-06-16 ENCOUNTER — Telehealth: Payer: Self-pay | Admitting: Family Medicine

## 2022-06-17 NOTE — Telephone Encounter (Signed)
No acute cardiopulmonary findings on x-ray

## 2022-06-17 NOTE — Telephone Encounter (Signed)
Pt aware by vm. 

## 2022-06-30 ENCOUNTER — Encounter: Payer: Self-pay | Admitting: Family Medicine

## 2022-06-30 ENCOUNTER — Ambulatory Visit (INDEPENDENT_AMBULATORY_CARE_PROVIDER_SITE_OTHER): Payer: Medicare HMO | Admitting: Family Medicine

## 2022-06-30 VITALS — BP 135/74 | HR 89 | Temp 98.3°F | Ht 63.0 in | Wt 216.0 lb

## 2022-06-30 DIAGNOSIS — J069 Acute upper respiratory infection, unspecified: Secondary | ICD-10-CM

## 2022-06-30 DIAGNOSIS — R051 Acute cough: Secondary | ICD-10-CM

## 2022-06-30 MED ORDER — AMOXICILLIN-POT CLAVULANATE 875-125 MG PO TABS: 1.0000 | ORAL_TABLET | Freq: Two times a day (BID) | ORAL | 0 refills | Status: AC

## 2022-06-30 MED ORDER — FLUTICASONE PROPIONATE 50 MCG/ACT NA SUSP: 2.0000 | Freq: Every day | NASAL | 6 refills | Status: DC

## 2022-06-30 NOTE — Progress Notes (Signed)
Subjective:  Patient ID: Rachael Nguyen, female    DOB: [birth year], 71 y.o.   MRN: [id number]  Patient Care Team: [name], DO as PCP - General (Family Medicine) Minus Breeding, MD as PCP - Cardiology (Cardiology) Allyn Kenner, MD (Dermatology)   Chief Complaint:  Cough and chest congestion (X 3 days)   HPI: Rachael Nguyen is a 71 y.o. female presenting on 06/30/2022 for Cough and chest congestion (X 3 days)   Pt was seen and treated for URI symptoms on 06/15/2022 with prednisone and tessalon. States she got better for about 1 day and then symptoms returned and were worse. She has not been taking anything for new symptoms.   Cough This is a recurrent problem. Episode onset: 06/12/2022. The problem has been gradually worsening. The cough is Non-productive. Associated symptoms include chills, headaches, nasal congestion, postnasal drip and rhinorrhea. Pertinent negatives include no chest pain, ear congestion, ear pain, fever, heartburn, hemoptysis, myalgias, rash, sore throat, shortness of breath, sweats, weight loss or wheezing. She has tried oral steroids and prescription cough suppressant for the symptoms. The treatment provided mild relief.      Relevant past medical, surgical, family, and social history reviewed and updated as indicated.  Allergies and medications reviewed and updated. Data reviewed: Chart in Epic.   Past Medical History:  Diagnosis Date   Hyperlipidemia    Osteopenia 04/2015 T score -2.4   FRAX not calculated    Past Surgical History:  Procedure Laterality Date   COMBINED HYSTEROSCOPY DIAGNOSTIC / D&C  2011   polyp   DENTAL SURGERY     3 teeth extracted    Social History   Socioeconomic History   Marital status: Married    Spouse name: Not on file   Number of children: 0   Years of education: Not on file   Highest education level: High school graduate  Occupational History   Occupation: Retired  Tobacco Use   Smoking  status: Never   Smokeless tobacco: Never  Vaping Use   Vaping Use: Never used  Substance and Sexual Activity   Alcohol use: No    Alcohol/week: 0.0 standard drinks of alcohol   Drug use: No   Sexual activity: Yes  Other Topics Concern   Not on file  Social History Narrative   Married.     Social Determinants of Health   Financial Resource Strain: Not on file  Food Insecurity: No Food Insecurity (10/25/2021)   Hunger Vital Sign    Worried About Running Out of Food in the Last Year: Never true    Ran Out of Food in the Last Year: Never true  Transportation Needs: No Transportation Needs (10/25/2021)   PRAPARE - Hydrologist (Medical): No    Lack of Transportation (Non-Medical): No  Physical Activity: Inactive (10/25/2021)   Exercise Vital Sign    Days of Exercise per Week: 0 days    Minutes of Exercise per Session: 0 min  Stress: No Stress Concern Present (10/25/2021)   [place]    Feeling of Stress : Not at all  Social Connections: [place] (10/25/2021)   Social Connection and Isolation Panel [NHANES]    Frequency of Communication with Friends and Family: More than three times a week    Frequency of Social Gatherings with Friends and Family: More than three times a week    Attends Religious Services: More than 4  times per year    Active Member of Clubs or Organizations: Yes    Attends Archivist Meetings: More than 4 times per year    Marital Status: Married  Human resources officer Violence: Not At Risk (10/25/2021)   Humiliation, Afraid, Rape, and Kick questionnaire    Fear of Current or Ex-Partner: No    Emotionally Abused: No    Physically Abused: No    Sexually Abused: No    Outpatient Encounter Medications as of 06/30/2022  Medication Sig   amoxicillin-clavulanate (AUGMENTIN) 875-125 MG tablet Take 1 tablet by mouth 2 (two) times daily for 10 days.   Ascorbic  Acid (VITAMIN C ADULT GUMMIES PO) Take by mouth daily.   cholecalciferol (VITAMIN D3) 25 MCG (1000 UNIT) tablet Take 2,000 Units by mouth daily.   famotidine (PEPCID) 20 MG tablet Take 20 mg by mouth daily.   fluticasone (FLONASE) 50 MCG/ACT nasal spray Place 2 sprays into both nostrils daily.   guaiFENesin (MUCINEX) 600 MG 12 hr tablet Take 1 tablet (600 mg total) by mouth 2 (two) times daily.   hydrochlorothiazide (MICROZIDE) 12.5 MG capsule Take 1 capsule (12.5 mg total) by mouth daily.   ibuprofen (ADVIL) 600 MG tablet Take 1 tablet (600 mg total) by mouth every 8 (eight) hours as needed for moderate pain.   pravastatin (PRAVACHOL) 40 MG tablet TAKE 1 TABLET BY MOUTH EVERY DAY   [DISCONTINUED] benzonatate (TESSALON PERLES) 100 MG capsule Take 1 capsule (100 mg total) by mouth 3 (three) times daily as needed.   [DISCONTINUED] predniSONE (STERAPRED UNI-PAK 21 TAB) 10 MG (21) TBPK tablet 6 tablet day 1, 5 tablet day 2, 4 tablet day 3, 3 tablet day 4, 2 tablet day 5, 1 tablet day 6   No facility-administered encounter medications on file as of 06/30/2022.    Allergies  Allergen Reactions   Fexofenadine Other (See Comments)   Hydrocodone-Acetaminophen    Vicodin [Hydrocodone-Acetaminophen] Nausea Only    dizziness   Allegra-D [Fexofenadine-Pseudoephed Er] Nausea Only   Codeine Other (See Comments)    GI upset; dizziness    Review of Systems  Constitutional:  Positive for activity change, chills and fatigue. Negative for appetite change, diaphoresis, fever, unexpected weight change and weight loss.  HENT:  Positive for congestion, postnasal drip and rhinorrhea. Negative for dental problem, drooling, ear discharge, ear pain, facial swelling, hearing loss, mouth sores, nosebleeds, sinus pressure, sinus pain, sneezing, sore throat, tinnitus, trouble swallowing and voice change.   Respiratory:  Positive for cough. Negative for apnea, hemoptysis, choking, chest tightness, shortness of breath,  wheezing and stridor.   Cardiovascular:  Negative for chest pain, palpitations and leg swelling.  Gastrointestinal:  Negative for abdominal pain and heartburn.  Genitourinary:  Negative for decreased urine volume and difficulty urinating.  Musculoskeletal:  Negative for arthralgias, back pain and myalgias.  Skin:  Negative for rash.  Neurological:  Positive for headaches. Negative for dizziness, tremors, seizures, syncope, facial asymmetry, speech difficulty, weakness, light-headedness and numbness.  Psychiatric/Behavioral:  Negative for confusion.   All other systems reviewed and are negative.       Objective:  BP 135/74   Pulse 89   Temp 98.3 F (36.8 C) (Temporal)   Ht _0  (1.6 m)   Wt 216 lb (98 kg)   SpO2 96%   BMI 38.26 kg/m    Wt Readings from Last 3 Encounters:  06/30/22 216 lb (98 kg)  04/11/22 226 lb (102.5 kg)  03/22/22 225 lb 9.6 oz (  102.3 kg)    Physical Exam Vitals and nursing note reviewed.  Constitutional:      General: She is not in acute distress.    Appearance: Normal appearance. She is well-developed and well-groomed. She is obese. She is not ill-appearing, toxic-appearing or diaphoretic.  HENT:     Head: Normocephalic and atraumatic.     Jaw: There is normal jaw occlusion.     Right Ear: Hearing, tympanic membrane, ear canal and external ear normal.     Left Ear: Hearing, tympanic membrane, ear canal and external ear normal.     Nose: Congestion present.     Mouth/Throat:     Lips: Pink.     Mouth: Mucous membranes are moist.     Pharynx: Oropharynx is clear. Uvula midline. Posterior oropharyngeal erythema present. No pharyngeal swelling or oropharyngeal exudate.     Tonsils: No tonsillar exudate or tonsillar abscesses.     Comments: Cobblestoning of posterior oropharynx. Postnasal drainage. Eyes:     General: Lids are normal.     Extraocular Movements: Extraocular movements intact.     Conjunctiva/sclera: Conjunctivae normal.     Pupils:  Pupils are equal, round, and reactive to light.  Neck:     Thyroid: No thyroid mass, thyromegaly or thyroid tenderness.     Vascular: No carotid bruit or JVD.     Trachea: Trachea and phonation normal.  Cardiovascular:     Rate and Rhythm: Normal rate and regular rhythm.     Chest Wall: PMI is not displaced.     Pulses: Normal pulses.     Heart sounds: Normal heart sounds. No murmur heard.    No friction rub. No gallop.  Pulmonary:     Effort: Pulmonary effort is normal. No respiratory distress.     Breath sounds: Normal breath sounds. No wheezing or rhonchi.     Comments: Dry cough Abdominal:     General: Bowel sounds are normal. There is no distension or abdominal bruit.     Palpations: Abdomen is soft. There is no hepatomegaly or splenomegaly.     Tenderness: There is no abdominal tenderness. There is no right CVA tenderness or left CVA tenderness.     Hernia: No hernia is present.  Musculoskeletal:        General: Normal range of motion.     Cervical back: Normal range of motion and neck supple.     Right lower leg: No edema.     Left lower leg: No edema.  Lymphadenopathy:     Cervical: No cervical adenopathy.  Skin:    General: Skin is warm and dry.     Capillary Refill: Capillary refill takes less than 2 seconds.     Coloration: Skin is not cyanotic, jaundiced or pale.     Findings: No rash.  Neurological:     General: No focal deficit present.     Mental Status: She is alert and oriented to person, place, and time.     Sensory: Sensation is intact.     Motor: Motor function is intact.     Coordination: Coordination is intact.     Gait: Gait is intact.     Deep Tendon Reflexes: Reflexes are normal and symmetric.  Psychiatric:        Attention and Perception: Attention and perception normal.        Mood and Affect: Mood and affect normal.        Speech: Speech normal.  Behavior: Behavior normal. Behavior is cooperative.        Thought Content: Thought content  normal.        Cognition and Memory: Cognition and memory normal.        Judgment: Judgment normal.     Results for orders placed or performed in visit on 50/35/46  Basic metabolic panel  Result Value Ref Range   Glucose 95 70 - 99 mg/dL   BUN 13 8 - 27 mg/dL   Creatinine, Ser 0.84 0.57 - 1.00 mg/dL   eGFR 76 >59 mL/min/1.73   BUN/Creatinine Ratio 15 12 - 28   Sodium 142 134 - 144 mmol/L   Potassium 4.2 3.5 - 5.2 mmol/L   Chloride 103 96 - 106 mmol/L   CO2 24 20 - 29 mmol/L   Calcium 9.3 8.7 - 10.3 mg/dL       Pertinent labs & imaging results that were available during my care of the patient were reviewed by me and considered in my medical decision making.  Assessment & Plan:  Nadalie was seen today for cough and chest congestion.  Diagnoses and all orders for this visit:  Acute cough URI with cough and congestion Second sickening. Viral testing pending. Due to second sickening and failed treatment with prednisone and tessalon, will initiate below. Pt aware to take Mucinex with plenty of water along with below medications. Report new, worsening, or persistent symptoms.  -     COVID-19, Flu A+B and RSV -     amoxicillin-clavulanate (AUGMENTIN) 875-125 MG tablet; Take 1 tablet by mouth 2 (two) times daily for 10 days. -     fluticasone (FLONASE) 50 MCG/ACT nasal spray; Place 2 sprays into both nostrils daily.     Continue all other maintenance medications.  Follow up plan: Return if symptoms worsen or fail to improve.   Continue healthy lifestyle choices, including diet (rich in fruits, vegetables, and lean proteins, and low in salt and simple carbohydrates) and exercise (at least 30 minutes of moderate physical activity daily).  Educational handout given for URI  The above assessment and management plan was discussed with the patient. The patient verbalized understanding of and has agreed to the management plan. Patient is aware to call the clinic if they develop any new  symptoms or if symptoms persist or worsen. Patient is aware when to return to the clinic for a follow-up visit. Patient educated on when it is appropriate to go to the emergency department.   Monia Pouch, FNP-C [facility] [phone]

## 2022-07-01 ENCOUNTER — Telehealth: Payer: Self-pay | Admitting: Family Medicine

## 2022-07-01 NOTE — Telephone Encounter (Signed)
Patient aware and verbalizes understanding. 

## 2022-07-01 NOTE — Telephone Encounter (Signed)
Patient was seen on 12/28 and had a telephone visit on 12/13, she is still not feeling better and wants to know if something else can be called in. Please call back and advise.

## 2022-07-02 LAB — COVID-19, FLU A+B AND RSV
Influenza A, NAA: NOT DETECTED
Influenza B, NAA: NOT DETECTED
RSV, NAA: NOT DETECTED
SARS-CoV-2, NAA: NOT DETECTED

## 2022-07-06 ENCOUNTER — Telehealth: Payer: Self-pay | Admitting: Family Medicine

## 2022-07-06 NOTE — Telephone Encounter (Signed)
Pt aware.

## 2022-07-06 NOTE — Telephone Encounter (Signed)
Pt says that she has diarrhea while taking amoxicillin-clavulanate (AUGMENTIN) 875-125 MG tablet . She has 4 days of the medication and wants to know what should she do. Please call back

## 2022-07-06 NOTE — Telephone Encounter (Signed)
Yogurt/ Probiotics (align/ culturelle, etc), hydrate well.  If her symptoms are RESOLVED. Ok to stop after 7d of total meds.  Otherwise, finish off augmentin as prescribed.

## 2022-07-25 ENCOUNTER — Telehealth (INDEPENDENT_AMBULATORY_CARE_PROVIDER_SITE_OTHER): Payer: Medicare HMO | Admitting: Family Medicine

## 2022-07-25 ENCOUNTER — Encounter: Payer: Self-pay | Admitting: Family Medicine

## 2022-07-25 DIAGNOSIS — R0981 Nasal congestion: Secondary | ICD-10-CM | POA: Diagnosis not present

## 2022-07-25 DIAGNOSIS — J3489 Other specified disorders of nose and nasal sinuses: Secondary | ICD-10-CM

## 2022-07-25 MED ORDER — LEVOCETIRIZINE DIHYDROCHLORIDE 5 MG PO TABS: 5.0000 mg | ORAL_TABLET | Freq: Every evening | ORAL | 1 refills | Status: DC

## 2022-07-25 MED ORDER — PREDNISONE 20 MG PO TABS: 40.0000 mg | ORAL_TABLET | Freq: Every day | ORAL | 0 refills | Status: AC

## 2022-07-25 NOTE — Progress Notes (Signed)
Virtual Visit via telephone Note Due to COVID-19 pandemic this visit was conducted virtually. This visit type was conducted due to national recommendations for restrictions regarding the COVID-19 Pandemic (e.g. social distancing, sheltering in place) in an effort to limit this patient's exposure and mitigate transmission in our community. All issues noted in this document were discussed and addressed.  A physical exam was not performed with this format.   I connected with Rachael Nguyen on 07/25/2022 at 0830 by telephone and verified that I am speaking with the correct person using two identifiers. Rachael Nguyen is currently located at home and patient is currently with them during visit. The provider, Monia Pouch, FNP is located in their office at time of visit.  I discussed the limitations, risks, security and privacy concerns of performing an evaluation and management service by virtual visit and the availability of in person appointments. I also discussed with the patient that there may be a patient responsible charge related to this service. The patient expressed understanding and agreed to proceed.  Subjective:  Patient ID: Rachael Nguyen, female    DOB: 03/02/1951, 72 y.o.   MRN: [id number]  Chief Complaint:  Nasal Congestion   HPI: [name] is a 72 y.o. female presenting on 07/25/2022 for Nasal Congestion   Pt presents today with complaints of recurrent nasal congestion with rhinorrhea. She has been seen and treated for this in the past. She has not been on an antihistamine or using her Flonase as prescribed. States her nose is stopped up and she is unable to tolerate this. She has been using nasal saline with minimal relief of symptoms. Denies other associated symptoms.      Relevant past medical, surgical, family, and social history reviewed and updated as indicated.  Allergies and medications reviewed and updated.   Past Medical History:  Diagnosis Date    Hyperlipidemia    Osteopenia 04/2015 T score -2.4   FRAX not calculated    Past Surgical History:  Procedure Laterality Date   COMBINED HYSTEROSCOPY DIAGNOSTIC / D&C  2011   polyp   DENTAL SURGERY     3 teeth extracted    Social History   Socioeconomic History   Marital status: Married    Spouse name: Not on file   Number of children: 0   Years of education: Not on file   Highest education level: High school graduate  Occupational History   Occupation: Retired  Tobacco Use   Smoking status: Never   Smokeless tobacco: Never  Vaping Use   Vaping Use: Never used  Substance and Sexual Activity   Alcohol use: No    Alcohol/week: 0.0 standard drinks of alcohol   Drug use: No   Sexual activity: Yes  Other Topics Concern   Not on file  Social History Narrative   Married.     Social Determinants of Health   Financial Resource Strain: Not on file  Food Insecurity: No Food Insecurity (10/25/2021)   Hunger Vital Sign    Worried About Running Out of Food in the Last Year: Never true    Ran Out of Food in the Last Year: Never true  Transportation Needs: No Transportation Needs (10/25/2021)   PRAPARE - Hydrologist (Medical): No    Lack of Transportation (Non-Medical): No  Physical Activity: Inactive (10/25/2021)   Exercise Vital Sign    Days of Exercise per Week: 0 days    Minutes of Exercise per  Session: 0 min  Stress: No Stress Concern Present (10/25/2021)   [place]    Feeling of Stress : Not at all  Social Connections: [place] (10/25/2021)   Social Connection and Isolation Panel [NHANES]    Frequency of Communication with Friends and Family: More than three times a week    Frequency of Social Gatherings with Friends and Family: More than three times a week    Attends Religious Services: More than 4 times per year    Active Member of [employer] or Organizations: Yes     Attends Music therapist: More than 4 times per year    Marital Status: Married  Human resources officer Violence: Not At Risk (10/25/2021)   Humiliation, Afraid, Rape, and Kick questionnaire    Fear of Current or Ex-Partner: No    Emotionally Abused: No    Physically Abused: No    Sexually Abused: No    Outpatient Encounter Medications as of 07/25/2022  Medication Sig   levocetirizine (XYZAL) 5 MG tablet Take 1 tablet (5 mg total) by mouth every evening.   predniSONE (DELTASONE) 20 MG tablet Take 2 tablets (40 mg total) by mouth daily with breakfast for 5 days.   Ascorbic Acid (VITAMIN C ADULT GUMMIES PO) Take by mouth daily.   cholecalciferol (VITAMIN D3) 25 MCG (1000 UNIT) tablet Take 2,000 Units by mouth daily.   famotidine (PEPCID) 20 MG tablet Take 20 mg by mouth daily.   fluticasone (FLONASE) 50 MCG/ACT nasal spray Place 2 sprays into both nostrils daily.   guaiFENesin (MUCINEX) 600 MG 12 hr tablet Take 1 tablet (600 mg total) by mouth 2 (two) times daily.   hydrochlorothiazide (MICROZIDE) 12.5 MG capsule Take 1 capsule (12.5 mg total) by mouth daily.   ibuprofen (ADVIL) 600 MG tablet Take 1 tablet (600 mg total) by mouth every 8 (eight) hours as needed for moderate pain.   pravastatin (PRAVACHOL) 40 MG tablet TAKE 1 TABLET BY MOUTH EVERY DAY   No facility-administered encounter medications on file as of 07/25/2022.    Allergies  Allergen Reactions   Fexofenadine Other (See Comments)   Hydrocodone-Acetaminophen    Vicodin [Hydrocodone-Acetaminophen] Nausea Only    dizziness   Allegra-D [Fexofenadine-Pseudoephed Er] Nausea Only   Codeine Other (See Comments)    GI upset; dizziness    Review of Systems  Constitutional:  Negative for activity change, appetite change, chills, fatigue and fever.  HENT:  Positive for congestion, postnasal drip and rhinorrhea. Negative for dental problem, drooling, ear discharge, ear pain, facial swelling, hearing loss, mouth sores,  nosebleeds, sinus pressure, sinus pain, sneezing, sore throat, tinnitus, trouble swallowing and voice change.   Eyes: Negative.   Respiratory:  Negative for cough, chest tightness, shortness of breath and wheezing.   Cardiovascular:  Negative for chest pain, palpitations and leg swelling.  Gastrointestinal:  Negative for blood in stool, constipation, diarrhea, nausea and vomiting.  Endocrine: Negative.   Genitourinary:  Negative for decreased urine volume, difficulty urinating, dysuria, frequency and urgency.  Musculoskeletal:  Negative for arthralgias and myalgias.  Skin: Negative.   Allergic/Immunologic: Negative.   Neurological:  Negative for dizziness, weakness and headaches.  Hematological: Negative.   Psychiatric/Behavioral:  Negative for confusion, hallucinations, sleep disturbance and suicidal ideas.   All other systems reviewed and are negative.        Observations/Objective: No vital signs or physical exam, this was a virtual health encounter.  Pt alert and oriented, answers all  questions appropriately, and able to speak in full sentences.    Assessment and Plan: Khyleigh was seen today for nasal congestion.  Diagnoses and all orders for this visit:  Nasal congestion with rhinorrhea No red flags concerning for acute bacterial infection. Consistent with common cold and allergic rhinitis. Pt aware to restart Flonase, will burst with steroids for nasal congestion. Start antihistamine nightly as prescribed. Symptomatic care discussed in detail. Aware to report new, worsening, or persistent symptoms. Follow up if not improving.  -     predniSONE (DELTASONE) 20 MG tablet; Take 2 tablets (40 mg total) by mouth daily with breakfast for 5 days. -     levocetirizine (XYZAL) 5 MG tablet; Take 1 tablet (5 mg total) by mouth every evening.     Follow Up Instructions: Return if symptoms worsen or fail to improve.    I discussed the assessment and treatment plan with the patient.  The patient was provided an opportunity to ask questions and all were answered. The patient agreed with the plan and demonstrated an understanding of the instructions.   The patient was advised to call back or seek an in-person evaluation if the symptoms worsen or if the condition fails to improve as anticipated.  The above assessment and management plan was discussed with the patient. The patient verbalized understanding of and has agreed to the management plan. Patient is aware to call the clinic if they develop any new symptoms or if symptoms persist or worsen. Patient is aware when to return to the clinic for a follow-up visit. Patient educated on when it is appropriate to go to the emergency department.    I provided 12 minutes of time during this telephone encounter.   Monia Pouch, FNP-C [facility] [address] [place], [place] [address] [phone] 07/25/2022

## 2022-07-28 ENCOUNTER — Telehealth: Payer: Medicare HMO

## 2022-10-12 ENCOUNTER — Encounter: Payer: Self-pay | Admitting: Family Medicine

## 2022-10-12 ENCOUNTER — Ambulatory Visit (INDEPENDENT_AMBULATORY_CARE_PROVIDER_SITE_OTHER): Payer: Medicare HMO | Admitting: Family Medicine

## 2022-10-12 VITALS — BP 136/80 | HR 65 | Temp 98.6°F | Ht 63.0 in | Wt 221.0 lb

## 2022-10-12 DIAGNOSIS — E782 Mixed hyperlipidemia: Secondary | ICD-10-CM

## 2022-10-12 DIAGNOSIS — I10 Essential (primary) hypertension: Secondary | ICD-10-CM

## 2022-10-12 DIAGNOSIS — Z6839 Body mass index (BMI) 39.0-39.9, adult: Secondary | ICD-10-CM

## 2022-10-12 DIAGNOSIS — B372 Candidiasis of skin and nail: Secondary | ICD-10-CM | POA: Diagnosis not present

## 2022-10-12 DIAGNOSIS — R739 Hyperglycemia, unspecified: Secondary | ICD-10-CM | POA: Diagnosis not present

## 2022-10-12 DIAGNOSIS — G4733 Obstructive sleep apnea (adult) (pediatric): Secondary | ICD-10-CM

## 2022-10-12 LAB — BAYER DCA HB A1C WAIVED: HB A1C (BAYER DCA - WAIVED): 5.4 % (ref 4.8–5.6)

## 2022-10-12 MED ORDER — NYSTATIN 100000 UNIT/GM EX POWD: 1.0000 | Freq: Three times a day (TID) | CUTANEOUS | 2 refills | Status: AC

## 2022-10-12 MED ORDER — HYDROCHLOROTHIAZIDE 12.5 MG PO CAPS: 12.5000 mg | ORAL_CAPSULE | Freq: Every day | ORAL | 3 refills | Status: DC

## 2022-10-12 MED ORDER — FLUCONAZOLE 150 MG PO TABS: 150.0000 mg | ORAL_TABLET | Freq: Once | ORAL | 0 refills | Status: AC

## 2022-10-12 MED ORDER — PRAVASTATIN SODIUM 40 MG PO TABS: ORAL_TABLET | ORAL | 3 refills | Status: DC

## 2022-10-12 NOTE — Progress Notes (Signed)
Subjective: CC: Hypertension, hyperlipidemia associate with morbid obesity PCP: Raliegh IpGottschalk, Cosima Prentiss M, DO WUJ:[id number]:Shaylah Aundria MemsJoyce Nguyen is a 72 y.o. female presenting to clinic today for:  1.  Hypertension hyperlipidemia associate with morbid obesity Patient is compliant with hydrochlorothiazide, Pravachol.  She is been doing seated exercise and notes that this is actually been exacerbating some left hip pain.  She continues to have difficulty not losing weight.  Unfortunately the GLP and GIP's are not affordable for her at this time.  She was diagnosed with obstructive sleep apnea last fall  2.  Rash Patient reports a waxing waning rash that occurs under her breasts.  This is also occurring underneath her abdomen.  This seemingly started after she was on antibiotics but notes that it can happen often.  She is been utilizing a corticosteroid cream which does not seem to totally relieve this issue.  She has had issues with topical antifungals in the past causing some dizziness.   ROS: Per HPI  Allergies  Allergen Reactions   Fexofenadine Other (See Comments)   Hydrocodone-Acetaminophen    Vicodin [Hydrocodone-Acetaminophen] Nausea Only    dizziness   Allegra-D [Fexofenadine-Pseudoephed Er] Nausea Only   Codeine Other (See Comments)    GI upset; dizziness   Past Medical History:  Diagnosis Date   Hyperlipidemia    Osteopenia 04/2015 T score -2.4   FRAX not calculated    Current Outpatient Medications:    Ascorbic Acid (VITAMIN C ADULT GUMMIES PO), Take by mouth daily., Disp: , Rfl:    cholecalciferol (VITAMIN D3) 25 MCG (1000 UNIT) tablet, Take 2,000 Units by mouth daily., Disp: , Rfl:    famotidine (PEPCID) 20 MG tablet, Take 20 mg by mouth daily., Disp: , Rfl:    fluticasone (FLONASE) 50 MCG/ACT nasal spray, Place 2 sprays into both nostrils daily., Disp: 16 g, Rfl: 6   guaiFENesin (MUCINEX) 600 MG 12 hr tablet, Take 1 tablet (600 mg total) by mouth 2 (two) times daily., Disp: 30  tablet, Rfl: 0   hydrochlorothiazide (MICROZIDE) 12.5 MG capsule, Take 1 capsule (12.5 mg total) by mouth daily., Disp: 90 capsule, Rfl: 3   ibuprofen (ADVIL) 600 MG tablet, Take 1 tablet (600 mg total) by mouth every 8 (eight) hours as needed for moderate pain., Disp: 30 tablet, Rfl: 1   levocetirizine (XYZAL) 5 MG tablet, Take 1 tablet (5 mg total) by mouth every evening., Disp: 30 tablet, Rfl: 1   pravastatin (PRAVACHOL) 40 MG tablet, TAKE 1 TABLET BY MOUTH EVERY DAY, Disp: 90 tablet, Rfl: 3 Social History   Socioeconomic History   Marital status: Married    Spouse name: Not on file   Number of children: 0   Years of education: Not on file   Highest education level: High school graduate  Occupational History   Occupation: Retired  Tobacco Use   Smoking status: Never   Smokeless tobacco: Never  Vaping Use   Vaping Use: Never used  Substance and Sexual Activity   Alcohol use: No    Alcohol/week: 0.0 standard drinks of alcohol   Drug use: No   Sexual activity: Yes  Other Topics Concern   Not on file  Social History Narrative   Married.     Social Determinants of Health   Financial Resource Strain: Not on file  Food Insecurity: No Food Insecurity (10/25/2021)   Hunger Vital Sign    Worried About Running Out of Food in the Last Year: Never true    Ran Out of  Food in the Last Year: Never true  Transportation Needs: No Transportation Needs (10/25/2021)   PRAPARE - Administrator, Civil Service (Medical): No    Lack of Transportation (Non-Medical): No  Physical Activity: Inactive (10/25/2021)   Exercise Vital Sign    Days of Exercise per Week: 0 days    Minutes of Exercise per Session: 0 min  Stress: No Stress Concern Present (10/25/2021)   [employer] of Occupational Health - Occupational Stress Questionnaire    Feeling of Stress : Not at all  Social Connections: Socially Integrated (10/25/2021)   Social Connection and Isolation Panel [NHANES]    Frequency of  Communication with Friends and Family: More than three times a week    Frequency of Social Gatherings with Friends and Family: More than three times a week    Attends Religious Services: More than 4 times per year    Active Member of [employer] or Organizations: Yes    Attends Engineer, structural: More than 4 times per year    Marital Status: Married  Catering manager Violence: Not At Risk (10/25/2021)   Humiliation, Afraid, Rape, and Kick questionnaire    Fear of Current or Ex-Partner: No    Emotionally Abused: No    Physically Abused: No    Sexually Abused: No   Family History  Problem Relation Age of Onset   Breast cancer Paternal Aunt    Hypertension Mother    Dementia Mother    Leukemia Father    Colon cancer Neg Hx    Esophageal cancer Neg Hx    Pancreatic cancer Neg Hx    Rectal cancer Neg Hx    Stomach cancer Neg Hx    Ulcerative colitis Neg Hx    Crohn's disease Neg Hx     Objective: Office vital signs reviewed. BP 136/80   Pulse 65   Temp 98.6 F (37 C)   Ht 5\' 3"  (1.6 m)   Wt 221 lb (100.2 kg)   SpO2 98%   BMI 39.15 kg/m   Physical Examination:  General: Awake, alert, well nourished, morbidly obese. No acute distress HEENT: sclera white, MMM Cardio: regular rate and rhythm, S1S2 heard, no murmurs appreciated Pulm: clear to auscultation bilaterally, no wheezes, rhonchi or rales; normal work of breathing on room air Skin: Macerated, erythematous and warm rash noted along the inferior aspects of bilateral breasts consistent with intertrigo.  No vesicular formation or crusting  Assessment/ Plan: 72 y.o. female   Primary hypertension - Plan: CMP14+EGFR, hydrochlorothiazide (MICROZIDE) 12.5 MG capsule  Mixed hyperlipidemia - Plan: CMP14+EGFR, Lipid Panel, TSH, pravastatin (PRAVACHOL) 40 MG tablet  Morbid obesity - Plan: CMP14+EGFR, Lipid Panel, Bayer DCA Hb A1c Waived, TSH, VITAMIN D 25 Hydroxy (Vit-D Deficiency, Fractures)  OSA (obstructive sleep  apnea)  Intertriginous candidiasis - Plan: nystatin powder, fluconazole (DIFLUCAN) 150 MG tablet  Blood pressure well-controlled.  Continue current regimen.  Check fasting metabolic panel and lipid  Continue statin.  She is working on lifestyle modifications but really is not having much success.  I sure hope that we can somehow get her covered through her insurance with maybe a medication that might be of assist in weight loss in the future.  Check A1c given morbid obesity  Continue recommendations with CPAP for OSA treatment  Diflucan and nystatin powder ordered for intertriginous candidiasis.  We discussed how to reduce risk of this.  I do worry about her recurrent use of oral corticosteroids I have recommended against this.  She will get her DEXA scan scheduled prior to discharge today  No orders of the defined types were placed in this encounter.  No orders of the defined types were placed in this encounter.    Raliegh Ip, DO [facility] [phone]

## 2022-10-12 NOTE — Progress Notes (Unsigned)
Office Visit    Patient Name: Rachael Nguyen Date of Encounter: 10/13/2022  Primary Care Provider:  Raliegh Ip, DO Primary Cardiologist:  Rollene Rotunda, MD Primary Electrophysiologist: None  Chief Complaint    Rachael Nguyen is a 72 y.o. female with PMH of LBBB, obesity, HTN, anxiety, HLD, who presents today for follow-up of hypertension.  Past Medical History    Past Medical History:  Diagnosis Date   Hyperlipidemia    Osteopenia 04/2015 T score -2.4   FRAX not calculated   Past Surgical History:  Procedure Laterality Date   COMBINED HYSTEROSCOPY DIAGNOSTIC / D&C  2011   polyp   DENTAL SURGERY     3 teeth extracted    Allergies  Allergies  Allergen Reactions   Fexofenadine Other (See Comments)   Hydrocodone-Acetaminophen    Vicodin [Hydrocodone-Acetaminophen] Nausea Only    dizziness   Allegra-D [Fexofenadine-Pseudoephed Er] Nausea Only   Codeine Other (See Comments)    GI upset; dizziness    History of Present Illness    Rachael Nguyen  is a 72 year old female with the above mention past medical history who presents today for follow-up of hypertension. She was seen initially by Dr. Antoine Poche in 2022 for complaint of chest pain and tachycardia.  She was found to have new LBBB and 2D echo was performed that revealed normal EF of 60-65%, no RWMA, no LVH, normal LV function, mild dilated LA with normal RA.  Lexiscan Myoview was also completed and was negative for ischemia.  No further work-up was completed at that time.  Patient was advised to monitor blood pressures at home.  Patient is noted to be treated for anxiety by her PCP.  She was started on amlodipine and experienced increased lightheadedness and was discontinued in July 2023.  She was seen most recently 02/2022 for follow-up.  During visit patient's blood pressure was 132/80 and patient had complaints of lightheadedness with rising from sitting position.  She was started on HCTZ 12.5 mg.   Patient was scheduled to follow-up with ophthalmologist concerning flashing lights.  She was also referred for sleep study due to possible sleep apnea.  She was seen in follow-up 03/22/2022 and blood pressures were remaining elevated with plan to increase HCTZ to 25 mg.  Patient was instructed to monitor blood pressures.   Please Sertich presents today for 36-month follow-up alone.  Since last being seen in the office patient reports that her blood pressures have been well-controlled at home.  In our office however her blood pressures are elevated at 148/80 and 168/94 on recheck.  I believe she has some element of whitecoat hypertension.  She is tolerating her HCTZ 12.5 mg but does endorse some dry mouth nightly.  During today's visit we discussed starting a new medication due to her current side effects.  She elected to continue her current regimen at this time.  She is staying active by exercising 3 to 4 days a week [facility].  She endorses some indiscretions with salt but consciously tries to control her intake.  During today's visit we also reviewed the results from her sleep study.  She has elected to not pursue CPAP titration at this time.  Patient denies chest pain, palpitations, dyspnea, PND, orthopnea, nausea, vomiting, dizziness, syncope, edema, weight gain, or early satiety.   Home Medications    Current Outpatient Medications  Medication Sig Dispense Refill   Ascorbic Acid (VITAMIN C ADULT GUMMIES PO)  Take by mouth daily.     cholecalciferol (VITAMIN D3) 25 MCG (1000 UNIT) tablet Take 2,000 Units by mouth daily.     famotidine (PEPCID) 20 MG tablet Take 20 mg by mouth daily.     fluconazole (DIFLUCAN) 150 MG tablet Take 150 mg by mouth once. Pt is directed to take a second tablet 150 mg if showing signs of infection.     hydrochlorothiazide (MICROZIDE) 12.5 MG capsule Take 1 capsule (12.5 mg total) by mouth daily. 90 capsule 3   ibuprofen (ADVIL) 600 MG tablet Take 1  tablet (600 mg total) by mouth every 8 (eight) hours as needed for moderate pain. 30 tablet 1   nystatin powder Apply 1 Application topically 3 (three) times daily. X7-10 days per flareup of rash under breasts 60 g 2   pravastatin (PRAVACHOL) 40 MG tablet TAKE 1 TABLET BY MOUTH EVERY DAY 90 tablet 3   No current facility-administered medications for this visit.     Review of Systems  Please see the history of present illness.    (+)Dry Mouth (+)  All other systems reviewed and are otherwise negative except as noted above.  Physical Exam    Wt Readings from Last 3 Encounters:  10/13/22 222 lb (100.7 kg)  10/12/22 221 lb (100.2 kg)  06/30/22 216 lb (98 kg)   VS: Vitals:   10/13/22 1459  BP: (!) 148/80  Pulse: 76  SpO2: 96%  ,Body mass index is 39.33 kg/m.  Constitutional:      Appearance: Healthy appearance. Not in distress.  Neck:     Vascular: JVD normal.  Pulmonary:     Effort: Pulmonary effort is normal.     Breath sounds: No wheezing. No rales. Diminished in the bases Cardiovascular:     Normal rate. Regular rhythm. Normal S1. Normal S2.      Murmurs: There is no murmur.  Edema:    Peripheral edema absent.  Abdominal:     Palpations: Abdomen is soft non tender. There is no hepatomegaly.  Skin:    General: Skin is warm and dry.  Neurological:     General: No focal deficit present.     Mental Status: Alert and oriented to person, place and time.     Cranial Nerves: Cranial nerves are intact.  EKG/LABS/ Recent Cardiac Studies    ECG personally reviewed by me today -none completed today   STOP-Bang Score:  6        Lab Results  Component Value Date   WBC 6.3 01/14/2022   HGB 13.2 01/14/2022   HCT 39.4 01/14/2022   MCV 87 01/14/2022   PLT 267 01/14/2022   Lab Results  Component Value Date   CREATININE 0.76 10/12/2022   BUN 15 10/12/2022   NA 140 10/12/2022   K 3.9 10/12/2022   CL 102 10/12/2022   CO2 26 10/12/2022   Lab Results  Component  Value Date   ALT 16 10/12/2022   AST 18 10/12/2022   ALKPHOS 70 10/12/2022   BILITOT 0.8 10/12/2022   Lab Results  Component Value Date   CHOL 167 10/12/2022   HDL 68 10/12/2022   LDLCALC 86 10/12/2022   TRIG 65 10/12/2022   CHOLHDL 2.5 10/12/2022    Lab Results  Component Value Date   HGBA1C 5.4 10/12/2022    Cardiac Studies & Procedures     STRESS TESTS  MYOCARDIAL PERFUSION IMAGING 12/01/2020  Narrative  Nuclear stress EF: 74%. The left ventricular ejection fraction is  hyperdynamic (>65%).  This is a low risk study. There is no evidence of ischemia and no evidence of previous infarction.  The study is normal.   ECHOCARDIOGRAM  ECHOCARDIOGRAM COMPLETE 11/05/2020  Narrative ECHOCARDIOGRAM REPORT    Patient Name:   BROOKLEN THOMANN Date of Exam: 11/05/2020 Medical Rec #:  [id number]         Height:       64.0 in Accession #:    [id number]        Weight:       213.0 lb Date of Birth:  1950/12/30        BSA:          2.009 m Patient Age:    69 years          BP:           158/100 mmHg Patient Gender: F                 HR:           65 bpm. Exam Location:  [facility]  Procedure: 2D Echo, Cardiac Doppler and Color Doppler  Indications:    R07.2 Precordial chest pain, R00.0 Tachycardia, I44.7 LBBB  History:        Patient has no prior history of Echocardiogram examinations. Arrythmias:LBBB and Tachycardia, Signs/Symptoms:Chest Pain and Dyspnea; Risk Factors:Hypertension, Non-Smoker, Dyslipidemia and Obese.  Sonographer:    Jake Seats RDMS, RVT, RDCS Referring Phys: [id number] JAMES HOCHREIN  IMPRESSIONS   1. Left ventricular ejection fraction, by estimation, is 60 to 65%. The left ventricle has normal function. The left ventricle has no regional wall motion abnormalities. Left ventricular diastolic parameters were normal. Normal global longitudinal strain of -18.7%. 2. Right ventricular systolic function is normal. The right ventricular size is normal. Tricuspid  regurgitation signal is inadequate for assessing PA pressure. 3. Left atrial size was mildly dilated. 4. The mitral valve is grossly normal. Trivial mitral valve regurgitation. 5. The aortic valve is tricuspid. Aortic valve regurgitation is not visualized. 6. The inferior vena cava is normal in size with greater than 50% respiratory variability, suggesting right atrial pressure of 3 mmHg.  FINDINGS Left Ventricle: Left ventricular ejection fraction, by estimation, is 60 to 65%. The left ventricle has normal function. The left ventricle has no regional wall motion abnormalities. The left ventricular internal cavity size was normal in size. There is no left ventricular hypertrophy. Left ventricular diastolic parameters were normal.  Right Ventricle: The right ventricular size is normal. No increase in right ventricular wall thickness. Right ventricular systolic function is normal. Tricuspid regurgitation signal is inadequate for assessing PA pressure.  Left Atrium: Left atrial size was mildly dilated.  Right Atrium: Right atrial size was normal in size.  Pericardium: There is no evidence of pericardial effusion. Presence of pericardial fat pad.  Mitral Valve: The mitral valve is grossly normal. Trivial mitral valve regurgitation.  Tricuspid Valve: The tricuspid valve is grossly normal. Tricuspid valve regurgitation is trivial.  Aortic Valve: The aortic valve is tricuspid. Aortic valve regurgitation is not visualized.  Pulmonic Valve: The pulmonic valve was grossly normal. Pulmonic valve regurgitation is not visualized.  Aorta: The aortic root is normal in size and structure.  Venous: The inferior vena cava is normal in size with greater than 50% respiratory variability, suggesting right atrial pressure of 3 mmHg.  IAS/Shunts: No atrial level shunt detected by color flow Doppler.   LEFT VENTRICLE PLAX 2D LVIDd:         5.01  cm     Diastology LVIDs:         2.92 cm     LV e' medial:     6.74 cm/s LV PW:         0.83 cm     LV E/e' medial:  11.8 LV IVS:        0.91 cm     LV e' lateral:   9.79 cm/s LVOT diam:     2.00 cm     LV E/e' lateral: 8.1 LV SV:         70 LV SV Index:   35          2D Longitudinal Strain LVOT Area:     3.14 cm    2D Strain GLS (A2C):   18.9 % 2D Strain GLS (A3C):   20.2 % 2D Strain GLS (A4C):   16.9 % LV Volumes (MOD)           2D Strain GLS Avg:     18.7 % LV vol d, MOD A2C: 68.9 ml LV vol d, MOD A4C: 77.7 ml LV vol s, MOD A2C: 24.7 ml LV vol s, MOD A4C: 31.7 ml LV SV MOD A2C:     44.2 ml LV SV MOD A4C:     77.7 ml LV SV MOD BP:      44.8 ml  RIGHT VENTRICLE RV S prime:     12.30 cm/s TAPSE (M-mode): 2.5 cm  LEFT ATRIUM             Index       RIGHT ATRIUM           Index LA diam:        3.50 cm 1.74 cm/m  RA Area:     17.00 cm LA Vol (A2C):   63.5 ml 31.60 ml/m RA Volume:   50.50 ml  25.13 ml/m LA Vol (A4C):   69.3 ml 34.49 ml/m LA Biplane Vol: 70.1 ml 34.89 ml/m AORTIC VALVE LVOT Vmax:   88.10 cm/s LVOT Vmean:  65.000 cm/s LVOT VTI:    0.222 m  AORTA Ao Root diam: 3.20 cm Ao Asc diam:  3.60 cm  MITRAL VALVE MV Area (PHT):             SHUNTS MV Decel Time: 177 msec    Systemic VTI:  0.22 m MR Peak grad: 51.7 mmHg    Systemic Diam: 2.00 cm MR Vmax:      359.67 cm/s MV E velocity: 79.30 cm/s MV A velocity: 64.70 cm/s MV E/A ratio:  1.23  Nona DellSamuel Mcdowell MD Electronically signed by Nona DellSamuel Mcdowell MD Signature Date/Time: 11/05/2020/10:30:55 AM    Final             Assessment & Plan    1.  Essential hypertension: -Patient's blood pressure today was elevated at 148/80 -She has well-controlled blood pressures at home and believes this is a form of whitecoat hypertension. -She was instructed to check her blood pressures over the next few weeks and report findings back to our office. -Continue HCTZ 12.5 mg daily -We will plan to start lisinopril 5 mg and discontinue HCTZ if blood pressures remain elevated and dry  mouth continues.  2.  LBBB: -Patient is currently asymptomatic and EKG with no acute changes -She reports no shortness of breath or lower extremity swelling -She is exercising and tolerating her activities without any dizziness or discomfort   3.  Precordial chest pain -Resolved and patient denies  any ongoing chest pain currently   4.  Lightheadedness: -She reports no returned episodes of lightheadedness since starting HCTZ.   5.  History of snoring: -Patient recently completed sleep apnea study with moderate diagnosis of sleep apnea confirmed. -Results were discussed during today's visit however patient has elected not to pursue CPAP titration at this time.  Disposition: Follow-up with Rollene Rotunda, MD or APP in 4 months   Medication Adjustments/Labs and Tests Ordered: Current medicines are reviewed at length with the patient today.  Concerns regarding medicines are outlined above.   Signed, Napoleon Form, Leodis Rains, NP 10/13/2022, 3:34 PM [facility]  Note:  This document was prepared using Dragon voice recognition software and may include unintentional dictation errors.

## 2022-10-12 NOTE — Patient Instructions (Signed)
Phone: [phone] GI  Intertrigo Intertrigo is skin irritation (inflammation) that happens in warm, moist areas of the body. The irritation can cause a rash and make skin raw and itchy. The rash is usually pink or red. It happens mostly between folds of skin or where skin rubs together, such as: In the armpits. Under the breasts. Under the belly. In the groin area. Around the butt area. Between the toes. This condition is not passed from person to person. What are the causes? Heat, moisture, rubbing, and not enough air movement. The condition can be made worse by: Sweat. Bacteria. A fungus, such as yeast. What increases the risk? Moisture in your skin folds. You are more likely to develop this condition if you: Are not able to move around. Live in a warm and moist climate. Are not able to control your pee (urine) or poop (stool). Wear splints, braces, or other medical devices. Are overweight. Have diabetes. What are the signs or symptoms? A pink or red skin rash in a skin fold or near a skin fold. Raw or scaly skin. Itching. A burning feeling. Bleeding. Leaking fluid. A bad smell. How is this treated? Cleaning and drying your skin. Taking an antibiotic medicine or using an antibiotic skin cream for a bacterial infection. Using an antifungal cream on your skin or taking pills for an infection that was caused by a fungus, such as yeast. Using a steroid ointment to stop the itching and irritation. Separating the skin fold with a clean cotton cloth to absorb moisture and allow air to flow into the area. Follow these instructions at home: Keep the affected area clean and dry. Do not scratch your skin. Stay cool as much as you can. Use an air conditioner or a fan, if you have one. Apply over-the-counter and prescription medicines only as told by your doctor. If you were prescribed antibiotics, use them as told by your doctor. Do not stop using the antibiotic even if you  start to feel better. Keep all follow-up visits. Your doctor may need to check your skin to make sure that the treatment is working. How is this prevented? Shower and dry yourself well after being active. Use a hair dryer on a cool setting to dry between skin folds. Do not wear tight clothes. Wear clothes that: Are loose. Take moisture away from your body. Are made of cotton. Wear a bra that gives good support, if needed. Protect the skin in your groin and butt area as told by your doctor. To do this: Follow a regular cleaning routine. Use creams, powders, or ointments that protect your skin. Change protection pads often. Stay at a healthy weight. Take care of your feet. This is very important if you have diabetes. You should: Wear shoes that fit well. Keep your feet dry. Wear clean cotton or wool socks. Keep your blood sugar under control if you have diabetes. Contact a doctor if: Your symptoms do not get better with treatment. Your symptoms get worse or they spread. You notice more redness and warmth. You have a fever. This information is not intended to replace advice given to you by your health care provider. Make sure you discuss any questions you have with your health care provider. Document Revised: 11/11/2021 Document Reviewed: 11/11/2021 Elsevier Patient Education  2023 [employer].

## 2022-10-13 ENCOUNTER — Encounter: Payer: Self-pay | Admitting: Nurse Practitioner

## 2022-10-13 ENCOUNTER — Ambulatory Visit: Payer: Medicare HMO | Attending: Nurse Practitioner | Admitting: Nurse Practitioner

## 2022-10-13 VITALS — BP 148/80 | HR 76 | Ht 63.0 in | Wt 222.0 lb

## 2022-10-13 DIAGNOSIS — G4730 Sleep apnea, unspecified: Secondary | ICD-10-CM | POA: Diagnosis not present

## 2022-10-13 DIAGNOSIS — R42 Dizziness and giddiness: Secondary | ICD-10-CM | POA: Diagnosis not present

## 2022-10-13 DIAGNOSIS — G4733 Obstructive sleep apnea (adult) (pediatric): Secondary | ICD-10-CM

## 2022-10-13 DIAGNOSIS — I10 Essential (primary) hypertension: Secondary | ICD-10-CM

## 2022-10-13 DIAGNOSIS — I447 Left bundle-branch block, unspecified: Secondary | ICD-10-CM

## 2022-10-13 LAB — CMP14+EGFR
ALT: 16 IU/L (ref 0–32)
AST: 18 IU/L (ref 0–40)
Albumin/Globulin Ratio: 1.7 (ref 1.2–2.2)
Albumin: 4.1 g/dL (ref 3.9–4.9)
Alkaline Phosphatase: 70 IU/L (ref 44–121)
BUN/Creatinine Ratio: 20 (ref 12–28)
BUN: 15 mg/dL (ref 8–27)
Bilirubin Total: 0.8 mg/dL (ref 0.0–1.2)
CO2: 26 mmol/L (ref 20–29)
Calcium: 9.4 mg/dL (ref 8.7–10.3)
Chloride: 102 mmol/L (ref 96–106)
Creatinine, Ser: 0.76 mg/dL (ref 0.57–1.00)
Globulin, Total: 2.4 g/dL (ref 1.5–4.5)
Glucose: 85 mg/dL (ref 70–99)
Potassium: 3.9 mmol/L (ref 3.5–5.2)
Sodium: 140 mmol/L (ref 134–144)
Total Protein: 6.5 g/dL (ref 6.0–8.5)
eGFR: 85 mL/min/{1.73_m2} (ref >59)

## 2022-10-13 LAB — TSH: TSH: 1.9 u[IU]/mL (ref 0.450–4.500)

## 2022-10-13 LAB — LIPID PANEL
Chol/HDL Ratio: 2.5 ratio (ref 0.0–4.4)
Cholesterol, Total: 167 mg/dL (ref 100–199)
HDL: 68 mg/dL (ref >39)
LDL Chol Calc (NIH): 86 mg/dL (ref 0–99)
Triglycerides: 65 mg/dL (ref 0–149)
VLDL Cholesterol Cal: 13 mg/dL (ref 5–40)

## 2022-10-13 LAB — VITAMIN D 25 HYDROXY (VIT D DEFICIENCY, FRACTURES): Vit D, 25-Hydroxy: 46 ng/mL (ref 30.0–100.0)

## 2022-10-13 NOTE — Patient Instructions (Addendum)
Medication Instructions:  Your physician recommends that you continue on your current medications as directed. Please refer to the Current Medication list given to you today. *If you need a refill on your cardiac medications before your next appointment, please call your pharmacy*   Lab Work: None ordered    Testing/Procedures: None ordered   Follow-Up: At [facility], you and your health needs are our priority.  As part of our continuing mission to provide you with exceptional heart care, we have created designated Provider Care Teams.  These Care Teams include your primary Cardiologist (physician) and Advanced Practice Providers (APPs -  Physician Assistants and Nurse Practitioners) who all work together to provide you with the care you need, when you need it.  We recommend signing up for the patient portal called "MyChart".  Sign up information is provided on this After Visit Summary.  MyChart is used to connect with patients for Virtual Visits (Telemedicine).  Patients are able to view lab/test results, encounter notes, upcoming appointments, etc.  Non-urgent messages can be sent to your provider as well.   To learn more about what you can do with MyChart, go to [email or web address].    Your next appointment:   4 month(s)  Provider:   Robin Searing, NP       Other Instructions Check your blood pressure daily for 2 weeks, then contact the office with your readings.  Please monitor blood pressures and keep a log of your readings.   Make sure to check 2 hours after your medications.   AVOID these things for 30 minutes before checking your blood pressure: No Drinking caffeine. No Drinking alcohol. No Eating. No Smoking. No Exercising.  Five minutes before checking your blood pressure: Pee. Sit in a dining chair. Avoid sitting in a soft couch or armchair. Be quiet. Do not talk.

## 2022-10-17 ENCOUNTER — Other Ambulatory Visit: Payer: Self-pay | Admitting: Family Medicine

## 2022-10-17 DIAGNOSIS — Z780 Asymptomatic menopausal state: Secondary | ICD-10-CM

## 2022-10-18 ENCOUNTER — Telehealth: Payer: Self-pay | Admitting: Family Medicine

## 2022-10-18 NOTE — Telephone Encounter (Signed)
Needs to r/s DEXA  

## 2022-10-20 ENCOUNTER — Other Ambulatory Visit: Payer: Medicare HMO

## 2022-10-31 ENCOUNTER — Telehealth: Payer: Self-pay | Admitting: Nurse Practitioner

## 2022-10-31 NOTE — Telephone Encounter (Signed)
Attempted to call patient, unable to leave message 

## 2022-10-31 NOTE — Telephone Encounter (Signed)
Pt stated she was advised to call in with her recent BP readings.  4/11- 131/81 4/12-138/77 4/14-118/80 4/15-135/80 later 118/78 4/16-114/71 4/17-138/77 later 109/69 4/18-126/79 4/19-112/76 4/21-114/71 4/22-120/67 4/24-118/74 4/25-111/73 4/27-115/67 4/28-127/73 4/29-119/75

## 2022-11-01 ENCOUNTER — Other Ambulatory Visit: Payer: Self-pay | Admitting: Family Medicine

## 2022-11-01 ENCOUNTER — Ambulatory Visit (INDEPENDENT_AMBULATORY_CARE_PROVIDER_SITE_OTHER): Payer: Medicare HMO

## 2022-11-01 DIAGNOSIS — Z780 Asymptomatic menopausal state: Secondary | ICD-10-CM

## 2022-11-01 DIAGNOSIS — M810 Age-related osteoporosis without current pathological fracture: Secondary | ICD-10-CM | POA: Diagnosis not present

## 2022-11-03 ENCOUNTER — Telehealth: Payer: Self-pay | Admitting: Family Medicine

## 2022-11-03 NOTE — Telephone Encounter (Signed)
Patient aware of dexa results she states she will have to wait and think before she decides what to do.

## 2022-11-03 NOTE — Telephone Encounter (Signed)
The patient has been notified of the result and verbalized understanding.  All questions (if any) were answered. Richelle Ito, RN 11/03/2022 1:40 PM

## 2022-11-07 ENCOUNTER — Telehealth: Payer: Self-pay | Admitting: Family Medicine

## 2022-11-07 NOTE — Telephone Encounter (Signed)
Contacted Rachael Nguyen to schedule their annual wellness visit. Appointment made for 12/05/2022.  Thank you,  [name],  AMB Clinical Support [facility] Direct Dial ??[phone]

## 2022-11-17 DIAGNOSIS — D485 Neoplasm of uncertain behavior of skin: Secondary | ICD-10-CM | POA: Diagnosis not present

## 2022-11-17 DIAGNOSIS — Z1283 Encounter for screening for malignant neoplasm of skin: Secondary | ICD-10-CM | POA: Diagnosis not present

## 2022-11-17 DIAGNOSIS — L821 Other seborrheic keratosis: Secondary | ICD-10-CM | POA: Diagnosis not present

## 2022-11-17 DIAGNOSIS — D225 Melanocytic nevi of trunk: Secondary | ICD-10-CM | POA: Diagnosis not present

## 2022-11-17 DIAGNOSIS — L820 Inflamed seborrheic keratosis: Secondary | ICD-10-CM | POA: Diagnosis not present

## 2022-12-05 ENCOUNTER — Ambulatory Visit (INDEPENDENT_AMBULATORY_CARE_PROVIDER_SITE_OTHER): Payer: Medicare HMO

## 2022-12-05 VITALS — Ht 63.0 in | Wt 216.0 lb

## 2022-12-05 DIAGNOSIS — Z0000 Encounter for general adult medical examination without abnormal findings: Secondary | ICD-10-CM

## 2022-12-05 NOTE — Patient Instructions (Signed)
Rachael Nguyen , Thank you for taking time to come for your Medicare Wellness Visit. I appreciate your ongoing commitment to your health goals. Please review the following plan we discussed and let me know if I can assist you in the future.   These are the goals we discussed:  Goals      DIET - EAT MORE FRUITS AND VEGETABLES     Exercise 150 min/wk Moderate Activity     Exercise bike and/or walking 30 minutes each day        This is a list of the screening recommended for you and due dates:  Health Maintenance  Topic Date Due   COVID-19 Vaccine (4 - 2023-24 season) 03/04/2022   Zoster (Shingles) Vaccine (1 of 2) 01/11/2023*   Pneumonia Vaccine (1 of 1 - PCV) 10/12/2023*   Flu Shot  02/02/2023   Mammogram  04/01/2023   Medicare Annual Wellness Visit  12/05/2023   DEXA scan (bone density measurement)  10/31/2024   Colon Cancer Screening  02/09/2025   Hepatitis C Screening  Completed   HPV Vaccine  Aged Out   DTaP/Tdap/Td vaccine  Discontinued  *Topic was postponed. The date shown is not the original due date.    Advanced directives: Advance directive discussed with you today. I have provided a copy for you to complete at home and have notarized. Once this is complete please bring a copy in to our office so we can scan it into your chart.   Conditions/risks identified: Aim for 30 minutes of exercise or brisk walking, 6-8 glasses of water, and 5 servings of fruits and vegetables each day.   Next appointment: Follow up in one year for your annual wellness visit    Preventive Care 65 Years and Older, Female Preventive care refers to lifestyle choices and visits with your health care provider that can promote health and wellness. What does preventive care include? A yearly physical exam. This is also called an annual well check. Dental exams once or twice a year. Routine eye exams. Ask your health care provider how often you should have your eyes checked. Personal lifestyle choices,  including: Daily care of your teeth and gums. Regular physical activity. Eating a healthy diet. Avoiding tobacco and drug use. Limiting alcohol use. Practicing safe sex. Taking low-dose aspirin every day. Taking vitamin and mineral supplements as recommended by your health care provider. What happens during an annual well check? The services and screenings done by your health care provider during your annual well check will depend on your age, overall health, lifestyle risk factors, and family history of disease. Counseling  Your health care provider may ask you questions about your: Alcohol use. Tobacco use. Drug use. Emotional well-being. Home and relationship well-being. Sexual activity. Eating habits. History of falls. Memory and ability to understand (cognition). Work and work Astronomer. Reproductive health. Screening  You may have the following tests or measurements: Height, weight, and BMI. Blood pressure. Lipid and cholesterol levels. These may be checked every 5 years, or more frequently if you are over 31 years old. Skin check. Lung cancer screening. You may have this screening every year starting at age 59 if you have a 30-pack-year history of smoking and currently smoke or have quit within the past 15 years. Fecal occult blood test (FOBT) of the stool. You may have this test every year starting at age 69. Flexible sigmoidoscopy or colonoscopy. You may have a sigmoidoscopy every 5 years or a colonoscopy every 10 years starting  at age 80. Hepatitis C blood test. Hepatitis B blood test. Sexually transmitted disease (STD) testing. Diabetes screening. This is done by checking your blood sugar (glucose) after you have not eaten for a while (fasting). You may have this done every 1-3 years. Bone density scan. This is done to screen for osteoporosis. You may have this done starting at age 13. Mammogram. This may be done every 1-2 years. Talk to your health care provider  about how often you should have regular mammograms. Talk with your health care provider about your test results, treatment options, and if necessary, the need for more tests. Vaccines  Your health care provider may recommend certain vaccines, such as: Influenza vaccine. This is recommended every year. Tetanus, diphtheria, and acellular pertussis (Tdap, Td) vaccine. You may need a Td booster every 10 years. Zoster vaccine. You may need this after age 65. Pneumococcal 13-valent conjugate (PCV13) vaccine. One dose is recommended after age 26. Pneumococcal polysaccharide (PPSV23) vaccine. One dose is recommended after age 22. Talk to your health care provider about which screenings and vaccines you need and how often you need them. This information is not intended to replace advice given to you by your health care provider. Make sure you discuss any questions you have with your health care provider. Document Released: 07/17/2015 Document Revised: 03/09/2016 Document Reviewed: 04/21/2015 Elsevier Interactive Patient Education  2017 [employer].  Fall Prevention in the Home Falls can cause injuries. They can happen to people of all ages. There are many things you can do to make your home safe and to help prevent falls. What can I do on the outside of my home? Regularly fix the edges of walkways and driveways and fix any cracks. Remove anything that might make you trip as you walk through a door, such as a raised step or threshold. Trim any bushes or trees on the path to your home. Use bright outdoor lighting. Clear any walking paths of anything that might make someone trip, such as rocks or tools. Regularly check to see if handrails are loose or broken. Make sure that both sides of any steps have handrails. Any raised decks and porches should have guardrails on the edges. Have any leaves, snow, or ice cleared regularly. Use sand or salt on walking paths during winter. Clean up any spills in  your garage right away. This includes oil or grease spills. What can I do in the bathroom? Use night lights. Install grab bars by the toilet and in the tub and shower. Do not use towel bars as grab bars. Use non-skid mats or decals in the tub or shower. If you need to sit down in the shower, use a plastic, non-slip stool. Keep the floor dry. Clean up any water that spills on the floor as soon as it happens. Remove soap buildup in the tub or shower regularly. Attach bath mats securely with double-sided non-slip rug tape. Do not have throw rugs and other things on the floor that can make you trip. What can I do in the bedroom? Use night lights. Make sure that you have a light by your bed that is easy to reach. Do not use any sheets or blankets that are too big for your bed. They should not hang down onto the floor. Have a firm chair that has side arms. You can use this for support while you get dressed. Do not have throw rugs and other things on the floor that can make you trip. What can  I do in the kitchen? Clean up any spills right away. Avoid walking on wet floors. Keep items that you use a lot in easy-to-reach places. If you need to reach something above you, use a strong step stool that has a grab bar. Keep electrical cords out of the way. Do not use floor polish or wax that makes floors slippery. If you must use wax, use non-skid floor wax. Do not have throw rugs and other things on the floor that can make you trip. What can I do with my stairs? Do not leave any items on the stairs. Make sure that there are handrails on both sides of the stairs and use them. Fix handrails that are broken or loose. Make sure that handrails are as long as the stairways. Check any carpeting to make sure that it is firmly attached to the stairs. Fix any carpet that is loose or worn. Avoid having throw rugs at the top or bottom of the stairs. If you do have throw rugs, attach them to the floor with carpet  tape. Make sure that you have a light switch at the top of the stairs and the bottom of the stairs. If you do not have them, ask someone to add them for you. What else can I do to help prevent falls? Wear shoes that: Do not have high heels. Have rubber bottoms. Are comfortable and fit you well. Are closed at the toe. Do not wear sandals. If you use a stepladder: Make sure that it is fully opened. Do not climb a closed stepladder. Make sure that both sides of the stepladder are locked into place. Ask someone to hold it for you, if possible. Clearly mark and make sure that you can see: Any grab bars or handrails. First and last steps. Where the edge of each step is. Use tools that help you move around (mobility aids) if they are needed. These include: Canes. Walkers. Scooters. Crutches. Turn on the lights when you go into a dark area. Replace any light bulbs as soon as they burn out. Set up your furniture so you have a clear path. Avoid moving your furniture around. If any of your floors are uneven, fix them. If there are any pets around you, be aware of where they are. Review your medicines with your doctor. Some medicines can make you feel dizzy. This can increase your chance of falling. Ask your doctor what other things that you can do to help prevent falls. This information is not intended to replace advice given to you by your health care provider. Make sure you discuss any questions you have with your health care provider. Document Released: 04/16/2009 Document Revised: 11/26/2015 Document Reviewed: 07/25/2014 Elsevier Interactive Patient Education  2017 [employer].

## 2022-12-05 NOTE — Progress Notes (Signed)
Subjective:   Rachael Nguyen is a 72 y.o. female who presents for Medicare Annual (Subsequent) preventive examination. I connected with  Rachael Nguyen on 12/05/22 by a audio enabled telemedicine application and verified that I am speaking with the correct person using two identifiers.  Patient Location: Home  Provider Location: Home Office  I discussed the limitations of evaluation and management by telemedicine. The patient expressed understanding and agreed to proceed.  Review of Systems     Cardiac Risk Factors include: advanced age (>35men, >67 women);dyslipidemia     Objective:    Today's Vitals   12/05/22 1422  Weight: 216 lb (98 kg)  Height: 5\' 3"  (1.6 m)   Body mass index is 38.26 kg/m.     12/05/2022    2:25 PM 10/25/2021    1:26 PM 10/15/2020   11:10 AM 10/15/2019    9:49 AM 02/25/2019   12:59 PM 01/28/2015    7:52 AM  Advanced Directives  Does Patient Have a Medical Advance Directive? No No No No No No  Does patient want to make changes to medical advance directive?   Yes (MAU/Ambulatory/Procedural Areas - Information given)     Would patient like information on creating a medical advance directive? No - Patient declined No - Patient declined No - Patient declined Yes (ED - Information included in AVS)  No - patient declined information    Current Medications (verified) Outpatient Encounter Medications as of 12/05/2022  Medication Sig   Ascorbic Acid (VITAMIN C ADULT GUMMIES PO) Take by mouth daily.   cholecalciferol (VITAMIN D3) 25 MCG (1000 UNIT) tablet Take 2,000 Units by mouth daily.   famotidine (PEPCID) 20 MG tablet Take 20 mg by mouth daily.   fluconazole (DIFLUCAN) 150 MG tablet Take 150 mg by mouth once. Pt is directed to take a second tablet 150 mg if showing signs of infection.   hydrochlorothiazide (MICROZIDE) 12.5 MG capsule Take 1 capsule (12.5 mg total) by mouth daily.   ibuprofen (ADVIL) 600 MG tablet Take 1 tablet (600 mg total) by mouth  every 8 (eight) hours as needed for moderate pain.   nystatin powder Apply 1 Application topically 3 (three) times daily. X7-10 days per flareup of rash under breasts   pravastatin (PRAVACHOL) 40 MG tablet TAKE 1 TABLET BY MOUTH EVERY DAY   No facility-administered encounter medications on file as of 12/05/2022.    Allergies (verified) Fexofenadine, Hydrocodone-acetaminophen, Vicodin [hydrocodone-acetaminophen], Allegra-d [fexofenadine-pseudoephed er], and Codeine   History: Past Medical History:  Diagnosis Date   Hyperlipidemia    Osteopenia 04/2015 T score -2.4   FRAX not calculated   Past Surgical History:  Procedure Laterality Date   COMBINED HYSTEROSCOPY DIAGNOSTIC / D&C  2011   polyp   DENTAL SURGERY     3 teeth extracted   Family History  Problem Relation Age of Onset   Breast cancer Paternal Aunt    Hypertension Mother    Dementia Mother    Leukemia Father    Colon cancer Neg Hx    Esophageal cancer Neg Hx    Pancreatic cancer Neg Hx    Rectal cancer Neg Hx    Stomach cancer Neg Hx    Ulcerative colitis Neg Hx    Crohn's disease Neg Hx    Social History   Socioeconomic History   Marital status: Married    Spouse name: Not on file   Number of children: 0   Years of education: Not on file  Highest education level: High school graduate  Occupational History   Occupation: Retired  Tobacco Use   Smoking status: Never   Smokeless tobacco: Never  Vaping Use   Vaping Use: Never used  Substance and Sexual Activity   Alcohol use: No    Alcohol/week: 0.0 standard drinks of alcohol   Drug use: No   Sexual activity: Yes  Other Topics Concern   Not on file  Social History Narrative   Married.     Social Determinants of Health   Financial Resource Strain: Low Risk  (12/05/2022)   Overall Financial Resource Strain (CARDIA)    Difficulty of Paying Living Expenses: Not hard at all  Food Insecurity: No Food Insecurity (12/05/2022)   Hunger Vital Sign    Worried  About Running Out of Food in the Last Year: Never true    Ran Out of Food in the Last Year: Never true  Transportation Needs: No Transportation Needs (12/05/2022)   PRAPARE - Administrator, Civil Service (Medical): No    Lack of Transportation (Non-Medical): No  Physical Activity: Sufficiently Active (12/05/2022)   Exercise Vital Sign    Days of Exercise per Week: 3 days    Minutes of Exercise per Session: 60 min  Stress: No Stress Concern Present (12/05/2022)   [employer] of Occupational Health - Occupational Stress Questionnaire    Feeling of Stress : Not at all  Social Connections: Socially Integrated (12/05/2022)   Social Connection and Isolation Panel [NHANES]    Frequency of Communication with Friends and Family: More than three times a week    Frequency of Social Gatherings with Friends and Family: More than three times a week    Attends Religious Services: More than 4 times per year    Active Member of [employer] or Organizations: Yes    Attends Engineer, structural: More than 4 times per year    Marital Status: Married    Tobacco Counseling Counseling given: Not Answered   Clinical Intake:  Pre-visit preparation completed: Yes  Pain : No/denies pain     Nutritional Risks: None Diabetes: No  How often do you need to have someone help you when you read instructions, pamphlets, or other written materials from your doctor or pharmacy?: 1 - Never  Diabetic?no   Interpreter Needed?: No  Information entered by :: Renie Ora, LPN   Activities of Daily Living    12/05/2022    2:25 PM  In your present state of health, do you have any difficulty performing the following activities:  Hearing? 0  Vision? 0  Difficulty concentrating or making decisions? 0  Walking or climbing stairs? 0  Dressing or bathing? 0  Doing errands, shopping? 0  Preparing Food and eating ? N  Using the Toilet? N  In the past six months, have you accidently leaked urine? N   Do you have problems with loss of bowel control? N  Managing your Medications? N  Managing your Finances? N  Housekeeping or managing your Housekeeping? N    Patient Care Team: Raliegh Ip, DO as PCP - General (Family Medicine) Rollene Rotunda, MD as PCP - Cardiology (Cardiology) Nita Sells, MD (Dermatology)  Indicate any recent Medical Services you may have received from other than [facility] providers in the past year (date may be approximate).     Assessment:   This is a routine wellness examination for Yisell.  Hearing/Vision screen Vision Screening - Comments:: Wears rx glasses - up to  date with routine eye exams with  Dr.Groat   Dietary issues and exercise activities discussed: Current Exercise Habits: Home exercise routine, Type of exercise: walking, Time (Minutes): 60, Frequency (Times/Week): 3, Weekly Exercise (Minutes/Week): 180, Intensity: Mild, Exercise limited by: None identified   Goals Addressed             This Visit's Progress    DIET - EAT MORE FRUITS AND VEGETABLES   On track      Depression Screen    12/05/2022    2:24 PM 04/11/2022    8:18 AM 01/14/2022    3:03 PM 12/15/2021    1:35 PM 10/25/2021    1:22 PM 04/07/2021    9:05 AM 02/12/2021    1:09 PM  PHQ 2/9 Scores  PHQ - 2 Score 0 0 0 0 0 0 0  PHQ- 9 Score  0 0 0       Fall Risk    12/05/2022    2:23 PM 10/12/2022   11:13 AM 04/11/2022    8:18 AM 01/14/2022    3:03 PM 12/15/2021    1:35 PM  Fall Risk   Falls in the past year? 0 0 1 1 0  Number falls in past yr: 0 0 0 0   Injury with Fall? 0 0 1 0   Risk for fall due to : No Fall Risks No Fall Risks Other (Comment) History of fall(s)   Risk for fall due to: Comment   tripped    Follow up Falls prevention discussed Education provided Falls evaluation completed Education provided     FALL RISK PREVENTION PERTAINING TO THE HOME:  Any stairs in or around the home? Yes  If so, are there any without handrails? No  Home free of loose throw  rugs in walkways, pet beds, electrical cords, etc? Yes  Adequate lighting in your home to reduce risk of falls? Yes   ASSISTIVE DEVICES UTILIZED TO PREVENT FALLS:  Life alert? No  Use of a cane, walker or w/c? No  Grab bars in the bathroom? No  Shower chair or bench in shower? Yes  Elevated toilet seat or a handicapped toilet? No          12/05/2022    2:25 PM 10/25/2021    1:30 PM 10/15/2019    9:54 AM  6CIT Screen  What Year? 0 points 0 points 0 points  What month? 0 points 0 points 0 points  What time? 0 points 0 points 0 points  Count back from 20 0 points 0 points 0 points  Months in reverse 0 points 0 points 0 points  Repeat phrase 0 points 0 points 0 points  Total Score 0 points 0 points 0 points    Immunizations Immunization History  Administered Date(s) Administered   Moderna Sars-Covid-2 Vaccination 08/08/2019, 09/09/2019, 05/13/2020   Tdap 05/17/2011    TDAP status: Due, Education has been provided regarding the importance of this vaccine. Advised may receive this vaccine at local pharmacy or Health Dept. Aware to provide a copy of the vaccination record if obtained from local pharmacy or Health Dept. Verbalized acceptance and understanding.  Flu Vaccine status: Declined, Education has been provided regarding the importance of this vaccine but patient still declined. Advised may receive this vaccine at local pharmacy or Health Dept. Aware to provide a copy of the vaccination record if obtained from local pharmacy or Health Dept. Verbalized acceptance and understanding.  Pneumococcal vaccine status: Declined,  Education has been provided  regarding the importance of this vaccine but patient still declined. Advised may receive this vaccine at local pharmacy or Health Dept. Aware to provide a copy of the vaccination record if obtained from local pharmacy or Health Dept. Verbalized acceptance and understanding.   Covid-19 vaccine status: Declined, Education has been  provided regarding the importance of this vaccine but patient still declined. Advised may receive this vaccine at local pharmacy or Health Dept.or vaccine clinic. Aware to provide a copy of the vaccination record if obtained from local pharmacy or Health Dept. Verbalized acceptance and understanding.  Qualifies for Shingles Vaccine? Yes   Zostavax completed No   Shingrix Completed?: No.    Education has been provided regarding the importance of this vaccine. Patient has been advised to call insurance company to determine out of pocket expense if they have not yet received this vaccine. Advised may also receive vaccine at local pharmacy or Health Dept. Verbalized acceptance and understanding.  Screening Tests Health Maintenance  Topic Date Due   COVID-19 Vaccine (4 - 2023-24 season) 03/04/2022   Zoster Vaccines- Shingrix (1 of 2) 01/11/2023 (Originally 05/25/2003)   Pneumonia Vaccine 35+ Years old (1 of 1 - PCV) 10/12/2023 (Originally 05/24/2018)   INFLUENZA VACCINE  02/02/2023   MAMMOGRAM  04/01/2023   Medicare Annual Wellness (AWV)  12/05/2023   DEXA SCAN  10/31/2024   Colonoscopy  02/09/2025   Hepatitis C Screening  Completed   HPV VACCINES  Aged Out   DTaP/Tdap/Td  Discontinued    Health Maintenance  Health Maintenance Due  Topic Date Due   COVID-19 Vaccine (4 - 2023-24 season) 03/04/2022    Colorectal cancer screening: Type of screening: Colonoscopy. Completed 02/10/2015. Repeat every 10 years  Mammogram status: Completed 03/31/2022. Repeat every year  Bone Density status: Completed 11/01/2022. Results reflect: Bone density results: OSTEOPOROSIS. Repeat every 2 years.  Lung Cancer Screening: (Low Dose CT Chest recommended if Age 38-80 years, 30 pack-year currently smoking OR have quit w/in 15years.) does not qualify.   Lung Cancer Screening Referral: n/a  Additional Screening:  Hepatitis C Screening: does not qualify; Completed 02/19/2016  Vision Screening: Recommended  annual ophthalmology exams for early detection of glaucoma and other disorders of the eye. Is the patient up to date with their annual eye exam?  Yes  Who is the provider or what is the name of the office in which the patient attends annual eye exams? Dr.Groat  If pt is not established with a provider, would they like to be referred to a provider to establish care? No .   Dental Screening: Recommended annual dental exams for proper oral hygiene  Community Resource Referral / Chronic Care Management: CRR required this visit?  No   CCM required this visit?  No      Plan:     I have personally reviewed and noted the following in the patient's chart:   Medical and social history Use of alcohol, tobacco or illicit drugs  Current medications and supplements including opioid prescriptions. Patient is not currently taking opioid prescriptions. Functional ability and status Nutritional status Physical activity Advanced directives List of other physicians Hospitalizations, surgeries, and ER visits in previous 12 months Vitals Screenings to include cognitive, depression, and falls Referrals and appointments  In addition, I have reviewed and discussed with patient certain preventive protocols, quality metrics, and best practice recommendations. A written personalized care plan for preventive services as well as general preventive health recommendations were provided to patient.     Vernona Rieger  Royetta Asal, LPN   12/10/6293   Nurse Notes: none

## 2022-12-19 DIAGNOSIS — D485 Neoplasm of uncertain behavior of skin: Secondary | ICD-10-CM | POA: Diagnosis not present

## 2023-01-30 ENCOUNTER — Other Ambulatory Visit: Payer: Self-pay | Admitting: Family Medicine

## 2023-01-30 DIAGNOSIS — B372 Candidiasis of skin and nail: Secondary | ICD-10-CM

## 2023-01-30 NOTE — Telephone Encounter (Signed)
Is she using this EVERY DAY?  I put a large supply with 2 refills last visit.  It is NOT intended for every day use. It is not a preventative med but a TREATMENT med.  Please make sure she is using appropriately, as she should not require refills on this with typical use.

## 2023-01-31 NOTE — Telephone Encounter (Signed)
Pt LMOVM that there was a mix up at the pharmacy about 'statin', she thought they were talking about her Pravastatin. She does know how to use her Nystatin and is not currently having any issues wanted to let PCP and me know.

## 2023-01-31 NOTE — Telephone Encounter (Signed)
LMOVM w/ detailed information on how to use the Nystatin powder.

## 2023-02-13 ENCOUNTER — Ambulatory Visit: Payer: Medicare HMO | Admitting: Nurse Practitioner

## 2023-03-23 ENCOUNTER — Other Ambulatory Visit: Payer: Self-pay | Admitting: Family Medicine

## 2023-03-23 DIAGNOSIS — Z1210 Encounter for screening for malignant neoplasm of intestinal tract, unspecified: Secondary | ICD-10-CM

## 2023-04-11 ENCOUNTER — Ambulatory Visit
Admission: RE | Admit: 2023-04-11 | Discharge: 2023-04-11 | Disposition: A | Discharge status: Home / Self Care | Payer: Medicare HMO | Source: Ambulatory Visit | Attending: Family Medicine | Admitting: Family Medicine

## 2023-04-11 DIAGNOSIS — Z1231 Encounter for screening mammogram for malignant neoplasm of breast: Secondary | ICD-10-CM | POA: Diagnosis not present

## 2023-04-11 DIAGNOSIS — Z1210 Encounter for screening for malignant neoplasm of intestinal tract, unspecified: Secondary | ICD-10-CM

## 2023-04-12 DIAGNOSIS — H43811 Vitreous degeneration, right eye: Secondary | ICD-10-CM | POA: Diagnosis not present

## 2023-04-12 DIAGNOSIS — H2513 Age-related nuclear cataract, bilateral: Secondary | ICD-10-CM | POA: Diagnosis not present

## 2023-04-13 DIAGNOSIS — S61219A Laceration without foreign body of unspecified finger without damage to nail, initial encounter: Secondary | ICD-10-CM | POA: Diagnosis not present

## 2023-04-13 DIAGNOSIS — S61214S Laceration without foreign body of right ring finger without damage to nail, sequela: Secondary | ICD-10-CM | POA: Diagnosis not present

## 2023-04-17 ENCOUNTER — Ambulatory Visit: Payer: Medicare HMO | Admitting: Family Medicine

## 2023-04-17 ENCOUNTER — Encounter: Payer: Self-pay | Admitting: Family Medicine

## 2023-04-17 VITALS — BP 142/69 | HR 64 | Temp 98.7°F | Ht 63.0 in | Wt 210.0 lb

## 2023-04-17 DIAGNOSIS — M810 Age-related osteoporosis without current pathological fracture: Secondary | ICD-10-CM

## 2023-04-17 DIAGNOSIS — I10 Essential (primary) hypertension: Secondary | ICD-10-CM | POA: Diagnosis not present

## 2023-04-17 DIAGNOSIS — Z0001 Encounter for general adult medical examination with abnormal findings: Secondary | ICD-10-CM

## 2023-04-17 DIAGNOSIS — Z0000 Encounter for general adult medical examination without abnormal findings: Secondary | ICD-10-CM

## 2023-04-17 DIAGNOSIS — E782 Mixed hyperlipidemia: Secondary | ICD-10-CM | POA: Diagnosis not present

## 2023-04-17 DIAGNOSIS — M47816 Spondylosis without myelopathy or radiculopathy, lumbar region: Secondary | ICD-10-CM

## 2023-04-17 DIAGNOSIS — Z6837 Body mass index (BMI) 37.0-37.9, adult: Secondary | ICD-10-CM

## 2023-04-17 DIAGNOSIS — Z6839 Body mass index (BMI) 39.0-39.9, adult: Secondary | ICD-10-CM | POA: Insufficient documentation

## 2023-04-17 LAB — BAYER DCA HB A1C WAIVED: HB A1C (BAYER DCA - WAIVED): 5.6 % (ref 4.8–5.6)

## 2023-04-17 MED ORDER — HYDROCHLOROTHIAZIDE 12.5 MG PO CAPS
12.5000 mg | ORAL_CAPSULE | Freq: Every day | ORAL | 3 refills | Status: DC
Start: 2023-04-17 — End: 2024-02-23

## 2023-04-17 MED ORDER — PRAVASTATIN SODIUM 40 MG PO TABS
ORAL_TABLET | ORAL | 3 refills | Status: DC
Start: 2023-04-17 — End: 2023-04-18

## 2023-04-17 NOTE — Progress Notes (Signed)
Rachael Nguyen is a 72 y.o. female presents to office today for annual physical exam examination.    Concerns today include: 1.  Osteoarthritis She reports that she has been having some stiffness and generalized body pain when she gets up in the morning but after she gets up and moving things get better.  She notes that arthritis is usually alleviated by use of NSAID.  She does not take any scheduled Tylenol.  She remains physically active, exercising at least twice per week.  2.  Osteoporosis Noted on 2024 DEXA scan.  She does not want to proceed with any medications at this time but continues to do weightbearing exercise at least a couple of times per week and get plenty of calcium and vitamin D in her diet and supplements.  She does not report any bony pain but reports joint pain as above.  Occupation: retired, Marital status: married, Substance [id number] There are no preventive care reminders to display for this patient.  Refills needed today: all  Immunization History  Administered Date(s) Administered   Moderna Sars-Covid-2 Vaccination 08/08/2019, 09/09/2019, 05/13/2020   Tdap 05/17/2011, 04/13/2023   Past Medical History:  Diagnosis Date   Hyperlipidemia    Osteopenia 04/2015 T score -2.4   FRAX not calculated   Social History   Socioeconomic History   Marital status: Married    Spouse name: Not on file   Number of children: 0   Years of education: Not on file   Highest education level: High school graduate  Occupational History   Occupation: Retired  Tobacco Use   Smoking status: Never   Smokeless tobacco: Never  Vaping Use   Vaping status: Never Used  Substance and Sexual Activity   Alcohol use: No    Alcohol/week: 0.0 standard drinks of alcohol   Drug use: No   Sexual activity: Yes  Other Topics Concern   Not on file  Social History Narrative   Married.     Social Determinants of Health   Financial Resource Strain: Low Risk  (12/05/2022)   Overall  Financial Resource Strain (CARDIA)    Difficulty of Paying Living Expenses: Not hard at all  Food Insecurity: No Food Insecurity (12/05/2022)   Hunger Vital Sign    Worried About Running Out of Food in the Last Year: Never true    Ran Out of Food in the Last Year: Never true  Transportation Needs: No Transportation Needs (12/05/2022)   PRAPARE - Administrator, Civil Service (Medical): No    Lack of Transportation (Non-Medical): No  Physical Activity: Sufficiently Active (12/05/2022)   Exercise Vital Sign    Days of Exercise per Week: 3 days    Minutes of Exercise per Session: 60 min  Stress: No Stress Concern Present (12/05/2022)   [employer] of Occupational Health - Occupational Stress Questionnaire    Feeling of Stress : Not at all  Social Connections: Socially Integrated (12/05/2022)   Social Connection and Isolation Panel [NHANES]    Frequency of Communication with Friends and Family: More than three times a week    Frequency of Social Gatherings with Friends and Family: More than three times a week    Attends Religious Services: More than 4 times per year    Active Member of [employer] or Organizations: Yes    Attends Banker Meetings: More than 4 times per year    Marital Status: Married  Catering manager Violence: Not At Risk (12/05/2022)   Humiliation,  Afraid, Rape, and Kick questionnaire    Fear of Current or Ex-Partner: No    Emotionally Abused: No    Physically Abused: No    Sexually Abused: No   Past Surgical History:  Procedure Laterality Date   COMBINED HYSTEROSCOPY DIAGNOSTIC / D&C  2011   polyp   DENTAL SURGERY     3 teeth extracted   Family History  Problem Relation Age of Onset   Breast cancer Paternal Aunt    Hypertension Mother    Dementia Mother    Leukemia Father    Colon cancer Neg Hx    Esophageal cancer Neg Hx    Pancreatic cancer Neg Hx    Rectal cancer Neg Hx    Stomach cancer Neg Hx    Ulcerative colitis Neg Hx    Crohn's  disease Neg Hx     Current Outpatient Medications:    Ascorbic Acid (VITAMIN C ADULT GUMMIES PO), Take by mouth daily., Disp: , Rfl:    cholecalciferol (VITAMIN D3) 25 MCG (1000 UNIT) tablet, Take 2,000 Units by mouth daily., Disp: , Rfl:    famotidine (PEPCID) 20 MG tablet, Take 20 mg by mouth daily., Disp: , Rfl:    fluconazole (DIFLUCAN) 150 MG tablet, Take 150 mg by mouth once. Pt is directed to take a second tablet 150 mg if showing signs of infection., Disp: , Rfl:    ibuprofen (ADVIL) 600 MG tablet, Take 1 tablet (600 mg total) by mouth every 8 (eight) hours as needed for moderate pain., Disp: 30 tablet, Rfl: 1   nystatin powder, Apply 1 Application topically 3 (three) times daily. X7-10 days per flareup of rash under breasts, Disp: 60 g, Rfl: 2   hydrochlorothiazide (MICROZIDE) 12.5 MG capsule, Take 1 capsule (12.5 mg total) by mouth daily., Disp: 90 capsule, Rfl: 3   pravastatin (PRAVACHOL) 40 MG tablet, TAKE 1 TABLET BY MOUTH EVERY DAY, Disp: 90 tablet, Rfl: 3  Allergies  Allergen Reactions   Fexofenadine Other (See Comments)   Hydrocodone-Acetaminophen    Vicodin [Hydrocodone-Acetaminophen] Nausea Only    dizziness   Allegra-D [Fexofenadine-Pseudoephed Er] Nausea Only   Codeine Other (See Comments)    GI upset; dizziness     ROS: Review of Systems A comprehensive review of systems was negative except for: Gastrointestinal: positive for bleeding hemorrhoids Integument/breast: positive for laceration of left pink, ring finger Musculoskeletal: positive for arthralgias and back pain    Physical exam BP (!) 142/69   Pulse 64   Temp 98.7 F (37.1 C)   Ht 5\' 3"  (1.6 m)   Wt 210 lb (95.3 kg)   SpO2 96%   BMI 37.20 kg/m  General appearance: alert, cooperative, appears stated age, no distress, and morbidly obese Head: Normocephalic, without obvious abnormality, atraumatic Eyes: negative findings: lids and lashes normal, conjunctivae and sclerae normal, corneas clear, and  pupils equal, round, reactive to light and accomodation Ears: normal TM's and external ear canals both ears Nose: Nares normal. Septum midline. Mucosa normal. No drainage or sinus tenderness. Throat: lips, mucosa, and tongue normal; teeth and gums normal Neck: no adenopathy, no carotid bruit, supple, symmetrical, trachea midline, and thyroid not enlarged, symmetric, no tenderness/mass/nodules Back: mild scoliosis. ROM normal. No CVA tenderness. Lungs: clear to auscultation bilaterally Heart: regular rate and rhythm, S1, S2 normal, no murmur, click, rub or gallop Abdomen:  obese, soft, NT/ND Extremities:  Blue stitches appreciated on the palmar aspects of the fifth and fourth digits at the tip.  No evidence  of secondary bacterial infection Pulses: 2+ and symmetric Skin: Skin color, texture, turgor normal. No rashes or lesions Lymph nodes: Cervical, supraclavicular, and axillary nodes normal. Neurologic: Grossly normal      04/17/2023    9:09 AM 12/05/2022    2:24 PM 04/11/2022    8:18 AM  Depression screen PHQ 2/9  Decreased Interest 0 0 0  Down, Depressed, Hopeless 0 0 0  PHQ - 2 Score 0 0 0  Altered sleeping 0  0  Tired, decreased energy 0  0  Change in appetite 0  0  Feeling bad or failure about yourself  0  0  Trouble concentrating 0  0  Moving slowly or fidgety/restless 0  0  Suicidal thoughts 0  0  PHQ-9 Score 0  0  Difficult doing work/chores   Not difficult at all      04/17/2023    9:09 AM 04/11/2022    8:18 AM 01/14/2022    3:03 PM 12/15/2021    1:36 PM  GAD 7 : Generalized Anxiety Score  Nervous, Anxious, on Edge 0 0 0 0  Control/stop worrying 0 0 0 0  Worry too much - different things 0 0 0 0  Trouble relaxing 0 0 0 0  Restless 0 0 0 0  Easily annoyed or irritable 0 0 0 0  Afraid - awful might happen 0 0 0 0  Total GAD 7 Score 0 0 0 0  Anxiety Difficulty Not difficult at all Not difficult at all Not difficult at all Not difficult at all     Assessment/  Plan: Cicero Duck here for annual physical exam.   Annual physical exam  Primary hypertension - Plan: CMP14+EGFR, hydrochlorothiazide (MICROZIDE) 12.5 MG capsule  Mixed hyperlipidemia - Plan: CMP14+EGFR, Lipid Panel, TSH, pravastatin (PRAVACHOL) 40 MG tablet  Morbid obesity (HCC) - Plan: Bayer DCA Hb A1c Waived  BMI 37.0-37.9, adult - Plan: Bayer DCA Hb A1c Waived  Age-related osteoporosis without current pathological fracture - Plan: CMP14+EGFR  Spondylosis of lumbar region without myelopathy or radiculopathy  Blood pressure acceptable upon recheck.  Medications have been renewed.  Check renal function, liver enzymes  Fasting cholesterol collected.  Continue pravastatin  Check A1c given persistent morbid obesity.  Understands risks of not treating osteoporosis.  She will continue with naturopathic remedies including adequate resistance training and calcium and vitamin D in diet  Offered referral to physical therapy.  Discussed use of Tylenol at bedtime and up to 3 times daily as needed.  Okay to continue oral NSAID as needed.  However caution with occasional hemorrhoid that bleed.  Counseled on healthy lifestyle choices, including diet (rich in fruits, vegetables and lean meats and low in salt and simple carbohydrates) and exercise (at least 30 minutes of moderate physical activity daily).  Patient to follow up 1 year for CPE  Sahvannah Rieser M. Nadine Counts, DO

## 2023-04-17 NOTE — Patient Instructions (Addendum)
Try Tylenol arthritis at bedtime to see if this helps the morning symptoms.  Ok to use 1 tablet up to 3 times daily if needed.    Osteoporosis  Osteoporosis is when the bones get thin and weak. This can cause your bones to break (fracture) more easily. What are the causes? The exact cause of this condition is not known. What increases the risk? Having family members with this condition. Not eating enough healthy foods. Taking certain medicines. Being female. Being age 20 or older. Smoking or using other products that contain nicotine or tobacco, such as e-cigarettes or chewing tobacco. Not exercising. Being of European or Asian ancestry. Having a small body frame. What are the signs or symptoms? A broken bone might be the first sign, especially if the break results from a fall or injury that usually would not cause a bone to break. Other signs and symptoms include: Pain in the neck or low back. Being hunched over (stooped posture). Getting shorter. How is this treated? Eating more foods with more calcium and vitamin D in them. Doing exercises. Stopping tobacco use. Limiting how much alcohol you drink. Taking medicines to slow bone loss or help make the bones stronger. Taking supplements of calcium and vitamin D every day. Taking medicines to replace chemicals in the body (hormone replacement medicines). Monitoring your levels of calcium and vitamin D. The goal of treatment is to strengthen your bones and lower your risk for a bone break. Follow these instructions at home: Eating and drinking Eat plenty of calcium and vitamin D. These nutrients are good for your bones. Good sources of calcium and vitamin D include: Some fish, such as salmon and tuna. Foods that have calcium and vitamin D added to them (fortified foods), such as some breakfast cereals. Egg yolks. Cheese. Liver.  Activity Do exercises as told by your doctor. Ask your doctor what exercises are safe for you. You  should do: Exercises that make your muscles work to hold your body weight up (weight-bearing exercises). These include tai chi, yoga, and walking. Exercises to make your muscles stronger. One example is lifting weights. Lifestyle Do not drink alcohol if: Your doctor tells you not to drink. You are pregnant, may be pregnant, or are planning to become pregnant. If you drink alcohol: Limit how much you use to: 0-1 drink a day for women. 0-2 drinks a day for men. Know how much alcohol is in your drink. In the [place] one drink equals one 12 oz bottle of beer (355 mL), one 5 oz glass of wine (148 mL), or one 1 oz glass of hard liquor (44 mL). Do not smoke or use any products that contain nicotine or tobacco. If you need help quitting, ask your doctor. Preventing falls Use tools to help you move around (mobility aids) as needed. These include canes, walkers, scooters, and crutches. Keep rooms well-lit. Put away things on the floor that could make you trip. These include cords and rugs. Install safety rails on stairs. Install grab bars in bathrooms. Use rubber mats in slippery areas, like bathrooms. Wear shoes that: Fit you well. Support your feet. Have closed toes. Have rubber soles or low heels. Tell your doctor about all of the medicines you are taking. Some medicines can make you more likely to fall. General instructions Take over-the-counter and prescription medicines only as told by your doctor. Keep all follow-up visits. Contact a doctor if: You have not been tested (screened) for osteoporosis and you are: A woman  who is age 74 or older. A man who is age 53 or older. Get help right away if: You fall. You get hurt. Summary Osteoporosis happens when your bones get thin and weak. Weak bones can break (fracture) more easily. Eat plenty of calcium and vitamin D. These are good for your bones. Tell your doctor about all of the medicines that you take. This information is not intended  to replace advice given to you by your health care provider. Make sure you discuss any questions you have with your health care provider. Document Revised: 12/05/2019 Document Reviewed: 12/05/2019 Elsevier Patient Education  2024 [employer].

## 2023-04-18 ENCOUNTER — Other Ambulatory Visit: Payer: Self-pay | Admitting: Family Medicine

## 2023-04-18 DIAGNOSIS — E782 Mixed hyperlipidemia: Secondary | ICD-10-CM

## 2023-04-18 LAB — CMP14+EGFR
ALT: 16 [IU]/L (ref 0–32)
AST: 16 [IU]/L (ref 0–40)
Albumin: 4.2 g/dL (ref 3.9–4.9)
Alkaline Phosphatase: 72 [IU]/L (ref 44–121)
BUN/Creatinine Ratio: 21 (ref 12–28)
BUN: 17 mg/dL (ref 8–27)
Bilirubin Total: 0.8 mg/dL (ref 0.0–1.2)
CO2: 25 mmol/L (ref 20–29)
Calcium: 9.5 mg/dL (ref 8.7–10.3)
Chloride: 102 mmol/L (ref 96–106)
Creatinine, Ser: 0.81 mg/dL (ref 0.57–1.00)
Globulin, Total: 2.4 g/dL (ref 1.5–4.5)
Glucose: 82 mg/dL (ref 70–99)
Potassium: 3.9 mmol/L (ref 3.5–5.2)
Sodium: 141 mmol/L (ref 134–144)
Total Protein: 6.6 g/dL (ref 6.0–8.5)
eGFR: 79 mL/min/{1.73_m2} (ref >59)

## 2023-04-18 LAB — LIPID PANEL
Chol/HDL Ratio: 2.6 {ratio} (ref 0.0–4.4)
Cholesterol, Total: 178 mg/dL (ref 100–199)
HDL: 69 mg/dL (ref >39)
LDL Chol Calc (NIH): 94 mg/dL (ref 0–99)
Triglycerides: 80 mg/dL (ref 0–149)
VLDL Cholesterol Cal: 15 mg/dL (ref 5–40)

## 2023-04-18 LAB — TSH: TSH: 2.31 u[IU]/mL (ref 0.450–4.500)

## 2023-04-18 MED ORDER — PRAVASTATIN SODIUM 80 MG PO TABS
80.0000 mg | ORAL_TABLET | Freq: Every day | ORAL | 3 refills | Status: DC
Start: 2023-04-18 — End: 2023-04-26

## 2023-04-18 NOTE — Progress Notes (Signed)
Please inform of dose change. If has a full bottle of the 40s can double up until she picked up new strength.  Meds ordered this encounter  Medications   pravastatin (PRAVACHOL) 80 MG tablet    Sig: Take 1 tablet (80 mg total) by mouth daily. Dose change    Dispense:  90 tablet    Refill:  3

## 2023-04-26 ENCOUNTER — Telehealth: Payer: Self-pay | Admitting: Family Medicine

## 2023-04-26 DIAGNOSIS — E782 Mixed hyperlipidemia: Secondary | ICD-10-CM

## 2023-04-26 MED ORDER — PRAVASTATIN SODIUM 40 MG PO TABS
60.0000 mg | ORAL_TABLET | Freq: Every day | ORAL | 3 refills | Status: DC
Start: 2023-04-26 — End: 2024-01-12

## 2023-04-26 NOTE — Telephone Encounter (Signed)
Whilst my medical recommendation is to double, she is welcome to do whatever she prefers. However, I am not overly optimistic that this weak statin is going to do much for her with that little of an increase.

## 2023-04-26 NOTE — Telephone Encounter (Signed)
sent 

## 2023-04-26 NOTE — Telephone Encounter (Signed)
Pt called to let Dr Nadine Counts know that she is not willing to double up on her Pravastatin Rx. Says she may be okay with taking 1.5 pills but not doubling up. Please advise.

## 2023-04-26 NOTE — Telephone Encounter (Signed)
PATIENT AWARE AND STATES SHE JUST ISN'T COMFORTABLE DOUBLING, REQUESTS 40 MG SENT TO HER PHARMACY

## 2023-10-16 ENCOUNTER — Ambulatory Visit (INDEPENDENT_AMBULATORY_CARE_PROVIDER_SITE_OTHER): Payer: Medicare HMO | Admitting: Family Medicine

## 2023-10-16 ENCOUNTER — Encounter: Payer: Self-pay | Admitting: Family Medicine

## 2023-10-16 VITALS — BP 136/80 | HR 76 | Temp 98.0°F | Ht 63.0 in | Wt 224.0 lb

## 2023-10-16 DIAGNOSIS — I10 Essential (primary) hypertension: Secondary | ICD-10-CM | POA: Diagnosis not present

## 2023-10-16 DIAGNOSIS — Z6839 Body mass index (BMI) 39.0-39.9, adult: Secondary | ICD-10-CM | POA: Diagnosis not present

## 2023-10-16 DIAGNOSIS — E782 Mixed hyperlipidemia: Secondary | ICD-10-CM

## 2023-10-16 DIAGNOSIS — M810 Age-related osteoporosis without current pathological fracture: Secondary | ICD-10-CM

## 2023-10-16 LAB — BAYER DCA HB A1C WAIVED: HB A1C (BAYER DCA - WAIVED): 5.6 % (ref 4.8–5.6)

## 2023-10-16 LAB — LIPID PANEL

## 2023-10-16 NOTE — Patient Instructions (Signed)
 Dyslipidemia Dyslipidemia is an imbalance of waxy, fat-like substances (lipids) in the blood. The body needs lipids in small amounts. Dyslipidemia often involves a high level of cholesterol or triglycerides, which are types of lipids. Common forms of dyslipidemia include: High levels of LDL cholesterol. LDL is the type of cholesterol that causes fatty deposits (plaques) to build up in the blood vessels that carry blood away from the heart (arteries). Low levels of HDL cholesterol. HDL cholesterol is the type of cholesterol that protects against heart disease. High levels of HDL remove the LDL buildup from arteries. High levels of triglycerides. Triglycerides are a fatty substance in the blood that is linked to a buildup of plaques in the arteries. What are the causes? There are two main types of dyslipidemia: primary and secondary. Primary dyslipidemia is caused by changes (mutations) in genes that are passed down through families (inherited). These mutations cause several types of dyslipidemia. Secondary dyslipidemia may be caused by various risk factors that can lead to the disease, such as lifestyle choices and certain medical conditions. What increases the risk? You are more likely to develop this condition if you are an older man or if you are a woman who has gone through menopause. Other risk factors include: Having a family history of dyslipidemia. Taking certain medicines, including birth control pills, steroids, some diuretics, and beta-blockers. Eating a diet high in saturated fat. Smoking cigarettes or excessive alcohol intake. Having certain medical conditions such as diabetes, polycystic ovary syndrome (PCOS), kidney disease, liver disease, or hypothyroidism. Not exercising regularly. Being overweight or obese with too much belly fat. What are the signs or symptoms? In most cases, dyslipidemia does not usually cause any symptoms. In severe cases, very high lipid levels can  cause: Fatty bumps under the skin (xanthomas). A white or gray ring around the black center (pupil) of the eye. Very high triglyceride levels can cause inflammation of the pancreas (pancreatitis). How is this diagnosed? Your health care provider may diagnose dyslipidemia based on a routine blood test (fasting blood test). Because most people do not have symptoms of the condition, this blood testing (lipid profile) is done on adults age 71 and older and is repeated every 4-6 years. This test checks: Total cholesterol. This measures the total amount of cholesterol in your blood, including LDL cholesterol, HDL cholesterol, and triglycerides. A healthy number is below 200 mg/dL (4.09 mmol/L). LDL cholesterol. The target number for LDL cholesterol is different for each person, depending on individual risk factors. A healthy number is usually below 100 mg/dL (8.11 mmol/L). Ask your health care provider what your LDL cholesterol should be. HDL cholesterol. An HDL level of 60 mg/dL (9.14 mmol/L) or higher is best because it helps to protect against heart disease. A number below 40 mg/dL (7.82 mmol/L) for men or below 50 mg/dL (9.56 mmol/L) for women increases the risk for heart disease. Triglycerides. A healthy triglyceride number is below 150 mg/dL (2.13 mmol/L). If your lipid profile is abnormal, your health care provider may do other blood tests. How is this treated? Treatment depends on the type of dyslipidemia that you have and your other risk factors for heart disease and stroke. Your health care provider will have a target range for your lipid levels based on this information. Treatment for dyslipidemia starts with lifestyle changes, such as diet and exercise. Your health care provider may recommend that you: Get regular exercise. Make changes to your diet. Quit smoking if you smoke. Limit your alcohol intake. If diet  changes and exercise do not help you reach your goals, your health care provider  may also prescribe medicine to lower lipids. The most commonly prescribed type of medicine lowers your LDL cholesterol (statin drug). If you have a high triglyceride level, your provider may prescribe another type of drug (fibrate) or an omega-3 fish oil supplement, or both. Follow these instructions at home: Eating and drinking  Follow instructions from your health care provider or dietitian about eating or drinking restrictions. Eat a healthy diet as told by your health care provider. This can help you reach and maintain a healthy weight, lower your LDL cholesterol, and raise your HDL cholesterol. This may include: Limiting your calories, if you are overweight. Eating more fruits, vegetables, whole grains, fish, and lean meats. Limiting saturated fat, trans fat, and cholesterol. Do not drink alcohol if: Your health care provider tells you not to drink. You are pregnant, may be pregnant, or are planning to become pregnant. If you drink alcohol: Limit how much you have to: 0-1 drink a day for women. 0-2 drinks a day for men. Know how much alcohol is in your drink. In the [place] one drink equals one 12 oz bottle of beer (355 mL), one 5 oz glass of wine (148 mL), or one 1 oz glass of hard liquor (44 mL). Activity Get regular exercise. Start an exercise and strength training program as told by your health care provider. Ask your health care provider what activities are safe for you. Your health care provider may recommend: 30 minutes of aerobic activity 4-6 days a week. Brisk walking is an example of aerobic activity. Strength training 2 days a week. General instructions Do not use any products that contain nicotine or tobacco. These products include cigarettes, chewing tobacco, and vaping devices, such as e-cigarettes. If you need help quitting, ask your health care provider. Take over-the-counter and prescription medicines only as told by your health care provider. This includes  supplements. Keep all follow-up visits. This is important. Contact a health care provider if: You are having trouble sticking to your exercise or diet plan. You are struggling to quit smoking or to control your use of alcohol. Summary Dyslipidemia often involves a high level of cholesterol or triglycerides, which are types of lipids. Treatment depends on the type of dyslipidemia that you have and your other risk factors for heart disease and stroke. Treatment for dyslipidemia starts with lifestyle changes, such as diet and exercise. Your health care provider may prescribe medicine to lower lipids. This information is not intended to replace advice given to you by your health care provider. Make sure you discuss any questions you have with your health care provider. Document Revised: 01/21/2022 Document Reviewed: 08/24/2020 Elsevier Patient Education  2024 [employer].

## 2023-10-16 NOTE — Progress Notes (Signed)
 Subjective: CC: Follow-up hypertension, hyperlipidemia associate with morbid obesity PCP: Raliegh Ip, DO YQM:VHQION Rachael Nguyen is a 73 y.o. female presenting to clinic today for:  1.  Hypertension, hyperlipidemia associated with morbid obesity Patient is compliant with 40 mg of Pravachol.  She never advance to 60 mg.  She also takes hydrochlorothiazide 12.5 mg daily.  She does exercise twice per week for at least an hour per session and tries to watch her diet but is not on a very strict diet.  She had a severe GI illness last week which caused her to be very dehydrated and weak.  She still recovering from that has been adding activity a in efforts to alleviate some of the GI symptoms.  Her BMs are back to normal but she still has some lightheadedness and fatigue.  She wants to have all of her labs repeated today  She reports no chest pain, shortness of breath, jaundice  2.  Osteoporosis Patient is compliant with vitamin D and calcium.  She does exercise as above.  She reports no bony pain.  She incorporate some calcium and vitamin D3 diet as well   ROS: Per HPI  Allergies  Allergen Reactions   Fexofenadine Other (See Comments)   Hydrocodone-Acetaminophen    Vicodin [Hydrocodone-Acetaminophen] Nausea Only    dizziness   Allegra-D [Fexofenadine-Pseudoephed Er] Nausea Only   Codeine Other (See Comments)    GI upset; dizziness   Past Medical History:  Diagnosis Date   Hyperlipidemia    Osteopenia 04/2015 T score -2.4   FRAX not calculated    Current Outpatient Medications:    Ascorbic Acid (VITAMIN C ADULT GUMMIES PO), Take by mouth daily., Disp: , Rfl:    cholecalciferol (VITAMIN D3) 25 MCG (1000 UNIT) tablet, Take 2,000 Units by mouth daily., Disp: , Rfl:    famotidine (PEPCID) 20 MG tablet, Take 20 mg by mouth daily., Disp: , Rfl:    fluconazole (DIFLUCAN) 150 MG tablet, Take 150 mg by mouth once. Pt is directed to take a second tablet 150 mg if showing signs of  infection., Disp: , Rfl:    hydrochlorothiazide (MICROZIDE) 12.5 MG capsule, Take 1 capsule (12.5 mg total) by mouth daily., Disp: 90 capsule, Rfl: 3   ibuprofen (ADVIL) 600 MG tablet, Take 1 tablet (600 mg total) by mouth every 8 (eight) hours as needed for moderate pain., Disp: 30 tablet, Rfl: 1   nystatin powder, Apply 1 Application topically 3 (three) times daily. X7-10 days per flareup of rash under breasts, Disp: 60 g, Rfl: 2   pravastatin (PRAVACHOL) 40 MG tablet, Take 1.5 tablets (60 mg total) by mouth daily. Dose change, Disp: 135 tablet, Rfl: 3 Social History   Socioeconomic History   Marital status: Married    Spouse name: Not on file   Number of children: 0   Years of education: Not on file   Highest education level: High school graduate  Occupational History   Occupation: Retired  Tobacco Use   Smoking status: Never   Smokeless tobacco: Never  Vaping Use   Vaping status: Never Used  Substance and Sexual Activity   Alcohol use: No    Alcohol/week: 0.0 standard drinks of alcohol   Drug use: No   Sexual activity: Yes  Other Topics Concern   Not on file  Social History Narrative   Married.     Social Drivers of Corporate investment banker Strain: Low Risk  (12/05/2022)   Overall Physicist, medical Strain (  CARDIA)    Difficulty of Paying Living Expenses: Not hard at all  Food Insecurity: No Food Insecurity (12/05/2022)   Hunger Vital Sign    Worried About Running Out of Food in the Last Year: Never true    Ran Out of Food in the Last Year: Never true  Transportation Needs: No Transportation Needs (12/05/2022)   PRAPARE - Administrator, Civil Service (Medical): No    Lack of Transportation (Non-Medical): No  Physical Activity: Sufficiently Active (12/05/2022)   Exercise Vital Sign    Days of Exercise per Week: 3 days    Minutes of Exercise per Session: 60 min  Stress: No Stress Concern Present (12/05/2022)   [employer] of Occupational Health -  Occupational Stress Questionnaire    Feeling of Stress : Not at all  Social Connections: Socially Integrated (12/05/2022)   Social Connection and Isolation Panel [NHANES]    Frequency of Communication with Friends and Family: More than three times a week    Frequency of Social Gatherings with Friends and Family: More than three times a week    Attends Religious Services: More than 4 times per year    Active Member of [employer] or Organizations: Yes    Attends Engineer, structural: More than 4 times per year    Marital Status: Married  Catering manager Violence: Not At Risk (12/05/2022)   Humiliation, Afraid, Rape, and Kick questionnaire    Fear of Current or Ex-Partner: No    Emotionally Abused: No    Physically Abused: No    Sexually Abused: No   Family History  Problem Relation Age of Onset   Breast cancer Paternal Aunt    Hypertension Mother    Dementia Mother    Leukemia Father    Colon cancer Neg Hx    Esophageal cancer Neg Hx    Pancreatic cancer Neg Hx    Rectal cancer Neg Hx    Stomach cancer Neg Hx    Ulcerative colitis Neg Hx    Crohn's disease Neg Hx     Objective: Office vital signs reviewed. BP 136/80   Pulse 76   Temp 98 F (36.7 C)   Ht 5\' 3"  (1.6 m)   Wt 224 lb (101.6 kg)   SpO2 95%   BMI 39.68 kg/m   Physical Examination:  General: Awake, alert, morbidly obese, No acute distress HEENT: Sclera white.  Moist mucous membranes.  Oropharynx without exudates or masses. Cardio: regular rate and rhythm, S1S2 heard, no murmurs appreciated Pulm: clear to auscultation bilaterally, no wheezes, rhonchi or rales; normal work of breathing on room air GI: Obese but soft, non-tender, non-distended, bowel sounds present x4, no hepatomegaly, no splenomegaly, no masses  The 10-year ASCVD risk score (Arnett DK, et al., 2019) is: 13.1%   Values used to calculate the score:     Age: 73 years     Sex: Female     Is Non-Hispanic African American: No     Diabetic: No      Tobacco smoker: No     Systolic Blood Pressure: 136 mmHg     Is BP treated: Yes     HDL Cholesterol: 69 mg/dL     Total Cholesterol: 178 mg/dL  Assessment/ Plan: 73 y.o. female   Mixed hyperlipidemia - Plan: CMP14+EGFR, Lipid panel  Primary hypertension - Plan: CMP14+EGFR  Morbid obesity (HCC) - Plan: CMP14+EGFR, Lipid panel, Bayer DCA Hb A1c Waived  BMI 39.0-39.9,adult - Plan: CMP14+EGFR, Lipid panel,  Bayer DCA Hb A1c Waived  Age-related osteoporosis without current pathological fracture - Plan: CMP14+EGFR, VITAMIN D 25 Hydroxy (Vit-D Deficiency, Fractures)  Fasting labs recollected today.  We discussed the limitations and utility of frequent lab testing but she wanted to have these done electively to see where she is at.  Based on her last lab panel in October, her ASCVD risk score is 13.1%.  At that time I advised her to increase statin to 60 mg daily but it sounds like she has not done this for what ever reason.  The remainder of her labs are totally normal.  We will update her vitamin D lab today because she has osteoporosis, again which she chooses not to treat with conventional therapies.  She is compliant with vitamin D and calcium and she should continue this along with her weightbearing exercise.  Reinforced need for adequate hydration, balanced diet and continue physical exercise.  She may follow-up in 6 months for annual physical   Rachael Nikkel M Fox Salminen, DO [facility] [phone]

## 2023-10-17 ENCOUNTER — Other Ambulatory Visit: Payer: Self-pay | Admitting: Family Medicine

## 2023-10-17 DIAGNOSIS — E782 Mixed hyperlipidemia: Secondary | ICD-10-CM

## 2023-10-17 DIAGNOSIS — Z6839 Body mass index (BMI) 39.0-39.9, adult: Secondary | ICD-10-CM

## 2023-10-17 DIAGNOSIS — I10 Essential (primary) hypertension: Secondary | ICD-10-CM

## 2023-10-17 LAB — LIPID PANEL
Cholesterol, Total: 132 mg/dL (ref 100–199)
HDL: 48 mg/dL (ref >39)
LDL CALC COMMENT:: 2.8 ratio (ref 0.0–4.4)
LDL Chol Calc (NIH): 71 mg/dL (ref 0–99)
Triglycerides: 64 mg/dL (ref 0–149)
VLDL Cholesterol Cal: 13 mg/dL (ref 5–40)

## 2023-10-17 LAB — CMP14+EGFR
ALT: 26 IU/L (ref 0–32)
AST: 22 IU/L (ref 0–40)
Albumin: 3.9 g/dL (ref 3.9–4.9)
Alkaline Phosphatase: 72 IU/L (ref 44–121)
BUN/Creatinine Ratio: 19 (ref 12–28)
BUN: 14 mg/dL (ref 8–27)
Bilirubin Total: 0.6 mg/dL (ref 0.0–1.2)
CO2: 24 mmol/L (ref 20–29)
Calcium: 9.2 mg/dL (ref 8.7–10.3)
Chloride: 102 mmol/L (ref 96–106)
Creatinine, Ser: 0.75 mg/dL (ref 0.57–1.00)
Globulin, Total: 2.2 g/dL (ref 1.5–4.5)
Glucose: 88 mg/dL (ref 70–99)
Potassium: 3.8 mmol/L (ref 3.5–5.2)
Sodium: 141 mmol/L (ref 134–144)
Total Protein: 6.1 g/dL (ref 6.0–8.5)
eGFR: 86 mL/min/{1.73_m2} (ref >59)

## 2023-10-17 LAB — VITAMIN D 25 HYDROXY (VIT D DEFICIENCY, FRACTURES): Vit D, 25-Hydroxy: 50.6 ng/mL (ref 30.0–100.0)

## 2023-12-05 ENCOUNTER — Ambulatory Visit (HOSPITAL_BASED_OUTPATIENT_CLINIC_OR_DEPARTMENT_OTHER)
Admission: RE | Admit: 2023-12-05 | Discharge: 2023-12-05 | Disposition: A | Discharge status: Home / Self Care | Payer: Self-pay | Source: Ambulatory Visit | Attending: Family Medicine | Admitting: Family Medicine

## 2023-12-05 DIAGNOSIS — I10 Essential (primary) hypertension: Secondary | ICD-10-CM

## 2023-12-05 DIAGNOSIS — E782 Mixed hyperlipidemia: Secondary | ICD-10-CM

## 2023-12-05 DIAGNOSIS — Z6839 Body mass index (BMI) 39.0-39.9, adult: Secondary | ICD-10-CM

## 2023-12-11 ENCOUNTER — Ambulatory Visit: Payer: Self-pay | Admitting: Family Medicine

## 2023-12-25 ENCOUNTER — Ambulatory Visit: Payer: Self-pay

## 2023-12-25 NOTE — Telephone Encounter (Signed)
 FYI Only or Action Required?: FYI only for provider.  Patient was last seen in primary care on 10/16/2023 by Jolinda Norene HERO, DO. Called Nurse Triage reporting Facial Swelling. Symptoms began several days ago. Interventions attempted: OTC medications: cortisone cream. Symptoms are: gradually worsening.  Triage Disposition: See PCP When Office is Open (Within 3 Days)  Patient/caregiver understands and will follow disposition?: Yes  Copied from CRM [id number]. Topic: Clinical - Red Word Triage >> Dec 25, 2023  2:56 PM Larissa S wrote: [facility] that prompted transfer to Nurse Triage: swelling and redness-RT eye Reason for Disposition  [1] MILD eyelid swelling (puffiness) AND [2] persists > 3 days  (Exception: Suspect mosquito bites.)  Answer Assessment - Initial Assessment Questions Pt reports swollen eye to R upper eyelid for 3 days now. Pt reports having Hx of facial spots of localized swelling in past. Pt reports trying cortisone cream but R eye slightly more swollen today., reports not using any oral antihistamines.    1. ONSET: When did the swelling start? (e.g., minutes, hours, days)     Probably 3rd day now and about the same but a bit more swollen today.  2. LOCATION: What part of the eyelids is swollen?     R upper eyelid 3. SEVERITY: How swollen is it?     mild 4. ITCHING: Is there any itching? If Yes, ask: How much?   (Scale 1-10; mild, moderate or severe)     mild 5. PAIN: Is the swelling painful to touch? If Yes, ask: How painful is it?   (Scale 1-10; mild, moderate or severe)     no 6. FEVER: Do you have a fever? If Yes, ask: What is it, how was it measured, and when did it start?      no 7. CAUSE: What do you think is causing the swelling?     unknown 8. RECURRENT SYMPTOM: Have you had eyelid swelling before? If Yes, ask: When was the last time? What happened that time?     Pt reports having Hx of facial spots of localized swelling in past. 9.  OTHER SYMPTOMS: Do you have any other symptoms? (e.g., blurred vision, eye discharge, rash, runny nose)     No blur vison/reports does not affect vision at all. No discharge, no burning.  Protocols used: Eye - Swelling-A-AH

## 2023-12-26 ENCOUNTER — Encounter: Payer: Self-pay | Admitting: Nurse Practitioner

## 2023-12-26 ENCOUNTER — Ambulatory Visit (INDEPENDENT_AMBULATORY_CARE_PROVIDER_SITE_OTHER): Admitting: Nurse Practitioner

## 2023-12-26 VITALS — BP 141/77 | HR 77 | Temp 97.5°F | Ht 63.0 in | Wt 225.6 lb

## 2023-12-26 DIAGNOSIS — H10501 Unspecified blepharoconjunctivitis, right eye: Secondary | ICD-10-CM | POA: Diagnosis not present

## 2023-12-26 DIAGNOSIS — J309 Allergic rhinitis, unspecified: Secondary | ICD-10-CM | POA: Diagnosis not present

## 2023-12-26 MED ORDER — POLYMYXIN B-TRIMETHOPRIM 10000-0.1 UNIT/ML-% OP SOLN: 1.0000 [drp] | Freq: Four times a day (QID) | OPHTHALMIC | 0 refills | Status: DC

## 2023-12-26 MED ORDER — LEVOCETIRIZINE DIHYDROCHLORIDE 5 MG PO TABS: 2.5000 mg | ORAL_TABLET | Freq: Every evening | ORAL | 0 refills | Status: DC

## 2023-12-26 NOTE — Progress Notes (Signed)
 Acute Office Visit  Subjective:     Patient ID: Rachael Nguyen, female    DOB: Nov 28, 1950, 73 y.o.   MRN: [id number]  Chief Complaint  Patient presents with   Facial Swelling    Right eye swollen, red, itchy on and off since childhood     HPI Rachael Nguyen is a 73 year old female presenting on 12/27/2022 for an acute visit. She reports a chronic recurring dermatologic issue that she states has been present since childhood. The lesions intermittently appear on her face or around her eye. She has been self-treating with Bacitracin ointment without relief. She denies any associated visual disturbances.  In addition, the patient reports a lingering cough that began in March. The cough is intermittent and occasionally productive with clear sputum. She denies fever, chills, shortness of breath, or hemoptysis.  Active Ambulatory Problems    Diagnosis Date Noted   Hyperlipidemia 03/28/2013   GERD (gastroesophageal reflux disease) 06/04/2015   Osteopenia 06/04/2015   Diverticulosis of colon 12/01/2015   Morbid obesity (HCC) 02/19/2016   Chronic left-sided low back pain without sciatica 11/06/2018   Tachycardia 09/29/2020   Precordial chest pain 09/29/2020   Degeneration of lumbar intervertebral disc 03/27/2019   Degenerative scoliosis 03/27/2019   Degenerative spondylolisthesis 03/27/2019   Primary hypertension 12/15/2021   OSA (obstructive sleep apnea) 10/12/2022   Age-related osteoporosis without current pathological fracture 04/17/2023   BMI 39.0-39.9,adult 04/17/2023   Spondylosis of lumbar region without myelopathy or radiculopathy 04/17/2023   Allergic rhinitis 12/26/2023   Blepharoconjunctivitis of right eye 12/26/2023   Resolved Ambulatory Problems    Diagnosis Date Noted   Healthcare maintenance 09/03/2015   Intertrigo 12/01/2015   No Additional Past Medical History    Review of Systems  Constitutional:  Negative for chills and fever.  HENT:  Negative for  congestion and sore throat.   Eyes:  Positive for discharge and redness. Negative for blurred vision, double vision, photophobia and pain.  Respiratory:  Negative for cough and wheezing.   Cardiovascular:  Negative for chest pain and leg swelling.  Gastrointestinal:  Negative for constipation, diarrhea, nausea and vomiting.  Skin:  Negative for itching and rash.  Neurological:  Negative for dizziness and headaches.   Negative unless indicated in HPI    Objective:    BP (!) 141/77   Pulse 77   Temp (!) 97.5 F (36.4 C) (Temporal)   Ht 5' 3 (1.6 m)   Wt 225 lb 9.6 oz (102.3 kg)   SpO2 96%   BMI 39.96 kg/m  BP Readings from Last 3 Encounters:  12/26/23 (!) 141/77  10/16/23 136/80  04/17/23 (!) 142/69   Wt Readings from Last 3 Encounters:  12/26/23 225 lb 9.6 oz (102.3 kg)  10/16/23 224 lb (101.6 kg)  04/17/23 210 lb (95.3 kg)      Physical Exam Vitals reviewed.  Constitutional:      General: She is not in acute distress. HENT:     Head: Normocephalic and atraumatic.     Right Ear: Tympanic membrane and external ear normal. There is no impacted cerumen.     Left Ear: Tympanic membrane, ear canal and external ear normal. There is no impacted cerumen.     Nose: Mucosal edema present.   Eyes:     General: Lids are normal. Vision grossly intact. Gaze aligned appropriately. No visual field deficit or scleral icterus.       Right eye: Discharge present.        Left  eye: No foreign body.     Conjunctiva/sclera:     Right eye: Chemosis present.    Cardiovascular:     Heart sounds: Normal heart sounds.  Pulmonary:     Effort: Pulmonary effort is normal.     Breath sounds: Normal breath sounds.   Skin:    General: Skin is warm and dry.     Findings: No rash.   Neurological:     Mental Status: She is alert and oriented to person, place, and time.   Psychiatric:        Mood and Affect: Mood normal.        Behavior: Behavior normal.        Thought Content: Thought  content normal.        Judgment: Judgment normal.     No results found for any visits on 12/26/23.      Assessment & Plan:  Blepharoconjunctivitis of right eye, unspecified blepharoconjunctivitis type  Allergic rhinitis, unspecified seasonality, unspecified trigger  Other orders -     Polymyxin B-Trimethoprim; Place 1 drop into the right eye every 6 (six) hours.  Dispense: 10 mL; Refill: 0 -     Levocetirizine Dihydrochloride ; Take 0.5 tablets (2.5 mg total) by mouth every evening.  Dispense: 15 tablet; Refill: 0   Rachael Nguyen is a 73 yrs old caucasian female seen today fro conjunctivitis Polymixin drops right eye Allergic rhinitis: Xyzal  2.5 mg at bedtime Avoid applying bacitracin on eye The above assessment and management plan was discussed with the patient. The patient verbalized understanding of and has agreed to the management plan. Patient is aware to call the clinic if they develop any new symptoms or if symptoms persist or worsen. Patient is aware when to return to the clinic for a follow-up visit. Patient educated on when it is appropriate to go to the emergency department.  Return if symptoms worsen or fail to improve.  Rachael Nguyen [place] Thompson, DNP [facility] [address] [place], [place] [address] [phone]  Note: This document was prepared by [employer] voice dictation technology and any errors that results from this process are unintentional.

## 2023-12-28 ENCOUNTER — Telehealth: Payer: Self-pay

## 2023-12-28 NOTE — Telephone Encounter (Signed)
 Copied from CRM [id number]. Topic: Clinical - Lab/Test Results >> Dec 27, 2023  4:11 PM Sasha H wrote: Reason for CRM: pt was returning call about imaging results

## 2023-12-28 NOTE — Telephone Encounter (Signed)
 Kelci L Hill, CMA 12/27/2023  4:04 PM EDT     Pt has been notified

## 2024-01-04 ENCOUNTER — Other Ambulatory Visit: Payer: Self-pay

## 2024-01-04 DIAGNOSIS — R109 Unspecified abdominal pain: Secondary | ICD-10-CM | POA: Diagnosis not present

## 2024-01-04 DIAGNOSIS — R319 Hematuria, unspecified: Secondary | ICD-10-CM | POA: Diagnosis not present

## 2024-01-04 NOTE — ED Triage Notes (Signed)
 Pt reports intermittent lower abdominal pain that radiates into the left side that started Wednesday. Pain associated with pressure with urination.

## 2024-01-05 ENCOUNTER — Emergency Department (HOSPITAL_BASED_OUTPATIENT_CLINIC_OR_DEPARTMENT_OTHER)
Admission: EM | Admit: 2024-01-05 | Discharge: 2024-01-05 | Disposition: A | Discharge status: Home / Self Care | Attending: Emergency Medicine | Admitting: Emergency Medicine

## 2024-01-05 DIAGNOSIS — R319 Hematuria, unspecified: Secondary | ICD-10-CM

## 2024-01-05 DIAGNOSIS — R109 Unspecified abdominal pain: Secondary | ICD-10-CM

## 2024-01-05 LAB — COMPREHENSIVE METABOLIC PANEL WITH GFR
ALT: 26 U/L (ref 0–44)
AST: 29 U/L (ref 15–41)
Albumin: 4 g/dL (ref 3.5–5.0)
Alkaline Phosphatase: 77 U/L (ref 38–126)
Anion gap: 11 (ref 5–15)
BUN: 24 mg/dL — ABNORMAL HIGH (ref 8–23)
CO2: 26 mmol/L (ref 22–32)
Calcium: 9.7 mg/dL (ref 8.9–10.3)
Chloride: 103 mmol/L (ref 98–111)
Creatinine, Ser: 0.94 mg/dL (ref 0.44–1.00)
GFR, Estimated: >60 mL/min (ref >60)
Glucose, Bld: 92 mg/dL (ref 70–99)
Potassium: 3.8 mmol/L (ref 3.5–5.1)
Sodium: 140 mmol/L (ref 135–145)
Total Bilirubin: 0.5 mg/dL (ref 0.0–1.2)
Total Protein: 7 g/dL (ref 6.5–8.1)

## 2024-01-05 LAB — URINALYSIS, ROUTINE W REFLEX MICROSCOPIC
Bacteria, UA: NONE SEEN
Bilirubin Urine: NEGATIVE
Glucose, UA: NEGATIVE mg/dL
Ketones, ur: NEGATIVE mg/dL
Leukocytes,Ua: NEGATIVE
Nitrite: NEGATIVE
Protein, ur: NEGATIVE mg/dL
Specific Gravity, Urine: 1.017 (ref 1.005–1.030)
pH: 5.5 (ref 5.0–8.0)

## 2024-01-05 LAB — CBC
HCT: 40.9 % (ref 36.0–46.0)
Hemoglobin: 13.3 g/dL (ref 12.0–15.0)
MCH: 28.8 pg (ref 26.0–34.0)
MCHC: 32.5 g/dL (ref 30.0–36.0)
MCV: 88.5 fL (ref 80.0–100.0)
Platelets: 269 K/uL (ref 150–400)
RBC: 4.62 MIL/uL (ref 3.87–5.11)
RDW: 14.7 % (ref 11.5–15.5)
WBC: 8.1 K/uL (ref 4.0–10.5)
nRBC: 0 % (ref 0.0–0.2)

## 2024-01-05 LAB — LIPASE, BLOOD: Lipase: 31 U/L (ref 11–51)

## 2024-01-05 MED ORDER — CEPHALEXIN 500 MG PO CAPS: 500.0000 mg | ORAL_CAPSULE | Freq: Three times a day (TID) | ORAL | 0 refills | Status: DC

## 2024-01-05 MED ORDER — CEPHALEXIN 250 MG PO CAPS
500.0000 mg | ORAL_CAPSULE | Freq: Once | ORAL | Status: AC
  Administered 2024-01-05: 500 mg via ORAL
  Filled 2024-01-05: qty 2

## 2024-01-05 NOTE — ED Provider Notes (Signed)
 Rachael Nguyen EMERGENCY DEPARTMENT AT [facility] Provider Note   CSN: [id number] Arrival date & time: 01/04/24  2344     Patient presents with: Abdominal Pain   Rachael Nguyen is a 73 y.o. female.   The history is provided by the patient.  Patient reports over 2 days ago she started having pressure whenever she urinated.  There was no hematuria or dysuria.  No fevers or vomiting.  Over the past several hours she started having severe left-sided flank pain.  By the time of her arrival to the ER the flank pain has improved.  She is still having urinary pressure.  No previous abdominal surgeries.  No previous history of kidney stones.  She is overall feeling improved     Prior to Admission medications   Medication Sig Start Date End Date Taking? Authorizing Provider  cephALEXin  (KEFLEX ) 500 MG capsule Take 1 capsule (500 mg total) by mouth 3 (three) times daily. 01/05/24  Yes Midge Golas, MD  Ascorbic Acid (VITAMIN C ADULT GUMMIES PO) Take by mouth daily.    [provider]  cholecalciferol (VITAMIN D3) 25 MCG (1000 UNIT) tablet Take 2,000 Units by mouth daily.    [provider]  famotidine (PEPCID) 20 MG tablet Take 20 mg by mouth daily.    [provider]  fluconazole  (DIFLUCAN ) 150 MG tablet Take 150 mg by mouth once. Pt is directed to take a second tablet 150 mg if showing signs of infection. 10/12/22   [provider]  hydrochlorothiazide  (MICROZIDE ) 12.5 MG capsule Take 1 capsule (12.5 mg total) by mouth daily. 04/17/23   Jolinda Norene HERO, DO  levocetirizine (XYZAL ) 5 MG tablet Take 0.5 tablets (2.5 mg total) by mouth every evening. 12/26/23   Deitra Morton Sebastian Nena, NP  nystatin  powder Apply 1 Application topically 3 (three) times daily. X7-10 days per flareup of rash under breasts 10/12/22   Jolinda Norene M, DO  pravastatin  (PRAVACHOL ) 40 MG tablet Take 1.5 tablets (60 mg total) by mouth daily. Dose change 04/26/23   Jolinda Norene M, DO  trimethoprim -polymyxin b  (POLYTRIM ) ophthalmic solution Place 1 drop into the right eye every 6 (six) hours. 12/26/23   St Morton Sebastian Nena, NP    Allergies: Fexofenadine, Hydrocodone -acetaminophen, Vicodin [hydrocodone -acetaminophen], Allegra-d [fexofenadine-pseudoephed er], and Codeine    Review of Systems  Constitutional:  Negative for fever.  Gastrointestinal:  Negative for vomiting.  Genitourinary:  Negative for difficulty urinating, dysuria and hematuria.    Updated Vital Signs BP (!) 142/59   Pulse 61   Temp 97.9 F (36.6 C)   Resp 16   SpO2 97%   Physical Exam CONSTITUTIONAL: Well developed/well nourished, no distress HEAD: Normocephalic/atraumatic ENMT: Mucous membranes moist NECK: supple no meningeal signs SPINE/BACK:entire spine nontender CV: S1/S2 noted, no murmurs/rubs/gallops noted LUNGS: Lungs are clear to auscultation bilaterally, no apparent distress ABDOMEN: soft, nontender, no rebound or guarding, bowel sounds noted throughout abdomen GU:no cva tenderness NEURO: Pt is awake/alert/appropriate, moves all extremitiesx4.  No facial droop.  Full range of motion of all 4 extremities out difficulty EXTREMITIES: pulses normal/equalx4, full ROM SKIN: warm, color normal PSYCH: no abnormalities of mood noted, alert and oriented to situation  (all labs ordered are listed, but only abnormal results are displayed) Labs Reviewed  COMPREHENSIVE METABOLIC PANEL WITH GFR - Abnormal; Notable for the following components:      Result Value   BUN 24 (*)    All other components within normal limits  URINALYSIS, ROUTINE W  REFLEX MICROSCOPIC - Abnormal; Notable for the following components:   Hgb urine dipstick LARGE (*)    All other components within normal limits  LIPASE, BLOOD  CBC    EKG: None  Radiology: No results found.   Procedures   Medications Ordered in the ED  cephALEXin  (KEFLEX ) capsule 500 mg (500 mg Oral Given 01/05/24 0438)                                     Medical Decision Making Amount and/or Complexity of Data Reviewed Labs: ordered.  Risk Prescription drug management.   This patient presents to the ED for concern of flank pain, this involves an extensive number of treatment options, and is a complaint that carries with it a high risk of complications and morbidity.  The differential diagnosis includes but is not limited to ureteral stone, pyelonephritis, muscle strain, discitis, epidural abscess, psoas abscess, abdominal aortic aneurysm  Comorbidities that complicate the patient evaluation: Patient's presentation is complicated by their history of hypertension  Additional history obtained: Additional history obtained from spouse  Lab Tests: I Ordered, and personally interpreted labs.  The pertinent results include: Hematuria  Medicines ordered and prescription drug management: I ordered medication including Keflex  for possible UTI  Test Considered: I considered CT of the pelvis, but since patient is improving will defer at this time   Reevaluation: After the interventions noted above, I reevaluated the patient and found that they have :stayed the same  Complexity of problems addressed: Patient's presentation is most consistent with  acute presentation with potential threat to life or bodily function  Disposition: After consideration of the diagnostic results and the patient's response to treatment,  I feel that the patent would benefit from discharge  .    By the time of my evaluation patient was back to baseline.  Only reporting minimal urinary pressure. She will suspicion she had ureteral stone that has been passed given the hematuria.  She has no flank pain at this time  After discussion with patient and her request she would like to start antibiotic.  I do not feel imaging is mandated at this time given her symptoms are improving.  Low suspicion for acute abdominal or vascular  emergency    Final diagnoses:  Flank pain, acute  Hematuria, unspecified type    ED Discharge Orders          Ordered    cephALEXin  (KEFLEX ) 500 MG capsule  3 times daily        01/05/24 0432               Midge Golas, MD 01/05/24 [phone]

## 2024-01-09 DIAGNOSIS — B0089 Other herpesviral infection: Secondary | ICD-10-CM | POA: Diagnosis not present

## 2024-01-12 ENCOUNTER — Telehealth: Payer: Self-pay | Admitting: *Deleted

## 2024-01-12 ENCOUNTER — Other Ambulatory Visit: Payer: Self-pay | Admitting: Family Medicine

## 2024-01-12 DIAGNOSIS — E782 Mixed hyperlipidemia: Secondary | ICD-10-CM

## 2024-01-12 MED ORDER — PRAVASTATIN SODIUM 40 MG PO TABS: 60.0000 mg | ORAL_TABLET | Freq: Every day | ORAL | 3 refills | Status: DC

## 2024-01-12 NOTE — Telephone Encounter (Signed)
 Copied from CRM [id number]. Topic: Clinical - Prescription Issue >> Jan 12, 2024  8:23 AM Avram MATSU wrote: Reason for CRM: patient stated she keeps getting a refill for pravastatin  (PRAVACHOL ) 80 MG tablet [[id number]] but she needs pravastatin  (PRAVACHOL ) 40 MG tablet [[id number]] Please give her a callback

## 2024-01-12 NOTE — Telephone Encounter (Signed)
 Pt was getting her 40 mgs up until this last refill, she is getting low on her 40 mgs. She would like to get this straight. She is only taking 40 mg 1 QD

## 2024-01-12 NOTE — Telephone Encounter (Signed)
Pt aware of provider feedback and voiced understanding. 

## 2024-01-12 NOTE — Telephone Encounter (Signed)
 Please inform patient that I have renewed the 40 mg and not sure why she is continuing to get the 80 because the 40s what we have on file but this has been sent

## 2024-01-22 ENCOUNTER — Other Ambulatory Visit: Payer: Self-pay | Admitting: Nurse Practitioner

## 2024-01-23 ENCOUNTER — Telehealth: Payer: Self-pay | Admitting: Family Medicine

## 2024-01-23 DIAGNOSIS — E782 Mixed hyperlipidemia: Secondary | ICD-10-CM

## 2024-01-23 MED ORDER — PRAVASTATIN SODIUM 40 MG PO TABS: 40.0000 mg | ORAL_TABLET | Freq: Every day | ORAL | 3 refills | Status: DC

## 2024-01-23 NOTE — Telephone Encounter (Signed)
 Copied from CRM [id number]. Topic: Clinical - Medication Refill >> Jan 23, 2024  2:01 PM Rosaria E wrote: Medication:  pravastatin  (PRAVACHOL ) 40 MG tablet  Needs 40 MG, needs it for one pill per day, I need them to get this straight so my insurance will cover this  Has the patient contacted their pharmacy? Yes (Agent: If no, request that the patient contact the pharmacy for the refill. If patient does not wish to contact the pharmacy document the reason why and proceed with request.) (Agent: If yes, when and what did the pharmacy advise?)  This is the patient's preferred pharmacy:  [id number] - [place], [place] - [address] [place] [address] Phone: [phone] Fax: [phone]  Is this the correct pharmacy for this prescription? Yes If no, delete pharmacy and type the correct one.   Has the prescription been filled recently? Yes  Is the patient out of the medication? No  Has the patient been seen for an appointment in the last year OR does the patient have an upcoming appointment? Yes  Can we respond through MyChart? Yes  Agent: Please be advised that Rx refills may take up to 3 business days. We ask that you follow-up with your pharmacy.

## 2024-02-13 ENCOUNTER — Encounter: Payer: Self-pay | Admitting: Family Medicine

## 2024-02-13 ENCOUNTER — Ambulatory Visit: Admitting: Family Medicine

## 2024-02-13 VITALS — BP 148/87 | HR 69 | Temp 98.2°F | Ht 63.0 in | Wt 225.0 lb

## 2024-02-13 DIAGNOSIS — S22000A Wedge compression fracture of unspecified thoracic vertebra, initial encounter for closed fracture: Secondary | ICD-10-CM | POA: Insufficient documentation

## 2024-02-13 DIAGNOSIS — S22000S Wedge compression fracture of unspecified thoracic vertebra, sequela: Secondary | ICD-10-CM | POA: Diagnosis not present

## 2024-02-13 DIAGNOSIS — R3129 Other microscopic hematuria: Secondary | ICD-10-CM

## 2024-02-13 DIAGNOSIS — K219 Gastro-esophageal reflux disease without esophagitis: Secondary | ICD-10-CM | POA: Diagnosis not present

## 2024-02-13 DIAGNOSIS — K449 Diaphragmatic hernia without obstruction or gangrene: Secondary | ICD-10-CM | POA: Diagnosis not present

## 2024-02-13 DIAGNOSIS — E782 Mixed hyperlipidemia: Secondary | ICD-10-CM | POA: Diagnosis not present

## 2024-02-13 LAB — MICROSCOPIC EXAMINATION: Bacteria, UA: NONE SEEN

## 2024-02-13 LAB — URINALYSIS, ROUTINE W REFLEX MICROSCOPIC
Bilirubin, UA: NEGATIVE
Glucose, UA: NEGATIVE
Ketones, UA: NEGATIVE
Nitrite, UA: NEGATIVE
Protein,UA: NEGATIVE
RBC, UA: NEGATIVE
Specific Gravity, UA: 1.02 (ref 1.005–1.030)
Urobilinogen, Ur: 4 mg/dL — ABNORMAL HIGH (ref 0.2–1.0)
pH, UA: 7 (ref 5.0–7.5)

## 2024-02-13 NOTE — Patient Instructions (Addendum)
 Again, we discussed that your coronary artery calcium  score was 0.  This is excellent news because it means that we can hold your statin going forward for at least the next 5 years.  At which point, we can consider repeating this scan.  Of course, if clinical concerns arise like you develop diabetes or other concerning diagnoses, we can revisit the need for a statin.  I do want to work on blood pressure control.  Continue to try and make good decisions with regards to salt intake and physical exercise  Hiatal Hernia  A hiatal hernia occurs when part of the stomach slides above the muscle that separates the abdomen from the chest (diaphragm). A person can be born with a hiatal hernia (congenital), or it may develop over time. In almost all cases of hiatal hernia, only the top part of the stomach pushes through the diaphragm. Many people have a hiatal hernia with no symptoms. The larger the hernia, the more likely it is that you will have symptoms. In some cases, a hiatal hernia allows stomach acid to flow back into the tube that carries food from your mouth to your stomach (esophagus). This may cause heartburn symptoms. The development of heartburn symptoms may mean that you have a condition called gastroesophageal reflux disease (GERD). What are the causes? This condition is caused by a weakness in the opening (hiatus) where the esophagus passes through the diaphragm to attach to the upper part of the stomach. A person may be born with a weakness in the hiatus, or a weakness can develop over time. What increases the risk? This condition is more likely to develop in: Older people. Age is a major risk factor for a hiatal hernia, especially if you are over the age of 65. Pregnant women. People who are overweight. People who have frequent constipation. What are the signs or symptoms? Symptoms of this condition usually develop in the form of GERD symptoms. Symptoms include: Heartburn. Upset stomach  (indigestion). Trouble swallowing. Coughing or wheezing. Wheezing is making high-pitched whistling sounds when you breathe. Sore throat. Chest pain. Nausea and vomiting. How is this diagnosed? This condition may be diagnosed during testing for GERD. Tests that may be done include: X-rays of your stomach or chest. An upper gastrointestinal (GI) series. This is an X-ray exam of your GI tract that is taken after you swallow a chalky liquid that shows up clearly on the X-ray. Endoscopy. This is a procedure to look into your stomach using a thin, flexible tube that has a tiny camera and light on the end of it. How is this treated? This condition may be treated by: Dietary and lifestyle changes to help reduce GERD symptoms. Medicines. These may include: Over-the-counter antacids. Medicines that make your stomach empty more quickly. Medicines that block the production of stomach acid (H2 blockers). Stronger medicines to reduce stomach acid (proton pump inhibitors). Surgery to repair the hernia, if other treatments are not helping. If you have no symptoms, you may not need treatment. Follow these instructions at home: Lifestyle and activity Do not use any products that contain nicotine or tobacco. These products include cigarettes, chewing tobacco, and vaping devices, such as e-cigarettes. If you need help quitting, ask your health care provider. Try to achieve and maintain a healthy body weight. Avoid putting pressure on your abdomen. Anything that puts pressure on your abdomen increases the amount of acid that may be pushed up into your esophagus. Avoid bending over, especially after eating. Raise the head  of your bed by putting blocks under the legs. This keeps your head and esophagus higher than your stomach. Do not wear tight clothing around your chest or stomach. Try not to strain when having a bowel movement, when urinating, or when lifting heavy objects. Eating and drinking Avoid foods  that can worsen GERD symptoms. These may include: Fatty foods, like fried foods. Citrus fruits, like oranges or lemon. Other foods and drinks that contain acid, like orange juice or tomatoes. Spicy food. Chocolate. Eat frequent small meals instead of three large meals a day. This helps prevent your stomach from getting too full. Eat slowly. Do not lie down right after eating. Do not eat 1-2 hours before bed. Do not drink beverages with caffeine. These include cola, coffee, cocoa, and tea. Do not drink alcohol. General instructions Take over-the-counter and prescription medicines only as told by your health care provider. Keep all follow-up visits. Your health care provider will want to check that any new prescribed medicines are helping your symptoms. Contact a health care provider if: Your symptoms are not controlled with medicines or lifestyle changes. You are having trouble swallowing. You have coughing or wheezing that will not go away. Your pain is getting worse. Your pain spreads to your arms, neck, jaw, teeth, or back. You feel nauseous or you vomit. Get help right away if: You have shortness of breath. You vomit blood. You have bright red blood in your stools. You have black, tarry stools. These symptoms may be an emergency. Get help right away. Call 911. Do not wait to see if the symptoms will go away. Do not drive yourself to the hospital. Summary A hiatal hernia occurs when part of the stomach slides above the muscle that separates the abdomen from the chest. A person may be born with a weakness in the hiatus, or a weakness can develop over time. Symptoms of a hiatal hernia may include heartburn, trouble swallowing, or sore throat. Management of a hiatal hernia includes eating frequent small meals instead of three large meals a day. Get help right away if you vomit blood, have bright red blood in your stools, or have black, tarry stools. This information is not intended  to replace advice given to you by your health care provider. Make sure you discuss any questions you have with your health care provider. Document Revised: 08/17/2021 Document Reviewed: 08/17/2021 Elsevier Patient Education  2024 [employer].

## 2024-02-13 NOTE — Progress Notes (Signed)
 Subjective: CC: Follow-up CAC PCP: Rachael Rachael HERO, DO YEP:Rachael Nguyen is a 73 y.o. female presenting to clinic today for:  1.  Coronary artery calcium score She has been compliant with 40 mg of pravastatin .  Here to discuss secondary findings on the coronary artery calcium score.  She was not aware that she could discontinue the pravastatin  has been continuing to take it.  She reports some myalgia but many of it is related to the degenerative changes in her lumbar spine which are known.  She denies any chest pain or shortness of breath.  She has been under more stress due to her husband's recent diagnosis of prostate cancer.  She worries about his heart because he is going to be going on some hormone blockers for the prostate cancer soon  2.  Hematuria Patient reports that she was seen in the ER in July because she had some urinary symptoms and later flank pain.  It was felt that perhaps she was passing or had passed a stone because there was no evidence of urinary tract infection on her urine specimen.  However, no advanced imaging was obtained.  She is not reporting dysuria or hematuria now but would like to have a repeat urinalysis.  Agreeable to advanced imaging if this is persistently present.  Would prefer [facility]   ROS: Per HPI  Allergies  Allergen Reactions   Fexofenadine Other (See Comments)   Hydrocodone -Acetaminophen    Vicodin [Hydrocodone -Acetaminophen] Nausea Only    dizziness   Allegra-D [Fexofenadine-Pseudoephed Er] Nausea Only   Codeine Other (See Comments)    GI upset; dizziness   Past Medical History:  Diagnosis Date   Hyperlipidemia    Osteopenia 04/2015 T score -2.4   FRAX not calculated    Current Outpatient Medications:    Ascorbic Acid (VITAMIN C ADULT GUMMIES PO), Take by mouth daily., Disp: , Rfl:    cephALEXin  (KEFLEX ) 500 MG capsule, Take 1 capsule (500 mg total) by mouth 3 (three) times daily., Disp: 21 capsule, Rfl: 0    cholecalciferol (VITAMIN D3) 25 MCG (1000 UNIT) tablet, Take 2,000 Units by mouth daily., Disp: , Rfl:    famotidine (PEPCID) 20 MG tablet, Take 20 mg by mouth daily., Disp: , Rfl:    fluconazole  (DIFLUCAN ) 150 MG tablet, Take 150 mg by mouth once. Pt is directed to take a second tablet 150 mg if showing signs of infection., Disp: , Rfl:    hydrochlorothiazide  (MICROZIDE ) 12.5 MG capsule, Take 1 capsule (12.5 mg total) by mouth daily., Disp: 90 capsule, Rfl: 3   levocetirizine (XYZAL ) 5 MG tablet, TAKE 0.5 TABLETS (2.5 MG TOTAL) BY MOUTH EVERY EVENING., Disp: 15 tablet, Rfl: 5   nystatin  powder, Apply 1 Application topically 3 (three) times daily. X7-10 days per flareup of rash under breasts, Disp: 60 g, Rfl: 2   pravastatin  (PRAVACHOL ) 40 MG tablet, Take 1 tablet (40 mg total) by mouth daily. Dose change, Disp: 90 tablet, Rfl: 3   trimethoprim -polymyxin b  (POLYTRIM ) ophthalmic solution, Place 1 drop into the right eye every 6 (six) hours., Disp: 10 mL, Rfl: 0 Social History   Socioeconomic History   Marital status: Married    Spouse name: Not on file   Number of children: 0   Years of education: Not on file   Highest education level: High school graduate  Occupational History   Occupation: Retired  Tobacco Use   Smoking status: Never   Smokeless tobacco: Never  Advertising account planner  Vaping status: Never Used  Substance and Sexual Activity   Alcohol use: No    Alcohol/week: 0.0 standard drinks of alcohol   Drug use: No   Sexual activity: Yes  Other Topics Concern   Not on file  Social History Narrative   Married.     Social Drivers of Corporate investment banker Strain: Low Risk  (12/05/2022)   Overall Financial Resource Strain (CARDIA)    Difficulty of Paying Living Expenses: Not hard at all  Food Insecurity: No Food Insecurity (12/05/2022)   Hunger Vital Sign    Worried About Running Out of Food in the Last Year: Never true    Ran Out of Food in the Last Year: Never true   Transportation Needs: No Transportation Needs (12/05/2022)   PRAPARE - Administrator, Civil Service (Medical): No    Lack of Transportation (Non-Medical): No  Physical Activity: Sufficiently Active (12/05/2022)   Exercise Vital Sign    Days of Exercise per Week: 3 days    Minutes of Exercise per Session: 60 min  Stress: No Stress Concern Present (12/05/2022)   [employer] of Occupational Health - Occupational Stress Questionnaire    Feeling of Stress : Not at all  Social Connections: Socially Integrated (12/05/2022)   Social Connection and Isolation Panel    Frequency of Communication with Friends and Family: More than three times a week    Frequency of Social Gatherings with Friends and Family: More than three times a week    Attends Religious Services: More than 4 times per year    Active Member of [employer] or Organizations: Yes    Attends Engineer, structural: More than 4 times per year    Marital Status: Married  Catering manager Violence: Not At Risk (12/05/2022)   Humiliation, Afraid, Rape, and Kick questionnaire    Fear of Current or Ex-Partner: No    Emotionally Abused: No    Physically Abused: No    Sexually Abused: No   Family History  Problem Relation Age of Onset   Breast cancer Paternal Aunt    Hypertension Mother    Dementia Mother    Leukemia Father    Colon cancer Neg Hx    Esophageal cancer Neg Hx    Pancreatic cancer Neg Hx    Rectal cancer Neg Hx    Stomach cancer Neg Hx    Ulcerative colitis Neg Hx    Crohn's disease Neg Hx     Objective: Office vital signs reviewed. BP (!) 148/87   Pulse 69   Temp 98.2 F (36.8 C)   Ht 5' 3 (1.6 m)   Wt 225 lb (102.1 kg)   SpO2 97%   BMI 39.86 kg/m   Physical Examination:  General: Awake, alert, morbidly obese, No acute distress HEENT: Sclera white.  Moist mucous membranes Cardio: regular rate and rhythm Pulm: Normal work of breathing on room air  Assessment/ Plan: 73 y.o. female    Moderate mixed hyperlipidemia not requiring statin therapy  Microscopic hematuria - Plan: Urinalysis, Routine w reflex microscopic  Hiatal hernia with GERD  Compression fracture of body of thoracic vertebra (HCC)  We reviewed her coronary artery calcium score results.  Okay to withhold statin and we can repeat the scan in 5 years  We also reviewed secondary read at length today.  Will repeat her urinalysis given hematuria noted on that specimen in the absence of UTI.  Will plan for CT renal stone study if  it is persistently positive plus or minus referral to urology  We discussed hiatal hernia is small on imaging and does not require further intervention except for control of acid reflux.  We discussed that morbid obesity can increase progression of this and that she should continue to focus on lifestyle modification to reduce weight  Compression fracture is old.  She has chronic low back pain and I discussed with her if she would like referral to Dr. Bonner at some point for consideration of spinal injections I be glad to place referral  Total time spent with patient 27 minutes.  Greater than 50% of encounter spent in coordination of care/counseling.    Rachael CHRISTELLA Fielding, DO [facility] [phone]

## 2024-02-21 ENCOUNTER — Encounter (HOSPITAL_BASED_OUTPATIENT_CLINIC_OR_DEPARTMENT_OTHER): Payer: Self-pay | Admitting: Emergency Medicine

## 2024-02-21 ENCOUNTER — Emergency Department (HOSPITAL_BASED_OUTPATIENT_CLINIC_OR_DEPARTMENT_OTHER)

## 2024-02-21 ENCOUNTER — Telehealth: Payer: Self-pay | Admitting: Internal Medicine

## 2024-02-21 ENCOUNTER — Other Ambulatory Visit: Payer: Self-pay

## 2024-02-21 ENCOUNTER — Inpatient Hospital Stay (HOSPITAL_BASED_OUTPATIENT_CLINIC_OR_DEPARTMENT_OTHER)
Admission: EM | Admit: 2024-02-21 | Discharge: 2024-02-23 | DRG: 280 | Disposition: A | Discharge status: Home / Self Care | Attending: Internal Medicine | Admitting: Internal Medicine

## 2024-02-21 ENCOUNTER — Emergency Department (HOSPITAL_BASED_OUTPATIENT_CLINIC_OR_DEPARTMENT_OTHER): Admitting: Radiology

## 2024-02-21 DIAGNOSIS — I10 Essential (primary) hypertension: Secondary | ICD-10-CM | POA: Diagnosis present

## 2024-02-21 DIAGNOSIS — Z8249 Family history of ischemic heart disease and other diseases of the circulatory system: Secondary | ICD-10-CM | POA: Diagnosis not present

## 2024-02-21 DIAGNOSIS — I5181 Takotsubo syndrome: Secondary | ICD-10-CM | POA: Diagnosis not present

## 2024-02-21 DIAGNOSIS — E876 Hypokalemia: Secondary | ICD-10-CM | POA: Diagnosis not present

## 2024-02-21 DIAGNOSIS — E785 Hyperlipidemia, unspecified: Secondary | ICD-10-CM | POA: Diagnosis present

## 2024-02-21 DIAGNOSIS — R079 Chest pain, unspecified: Secondary | ICD-10-CM

## 2024-02-21 DIAGNOSIS — I5021 Acute systolic (congestive) heart failure: Secondary | ICD-10-CM | POA: Diagnosis not present

## 2024-02-21 DIAGNOSIS — Z885 Allergy status to narcotic agent status: Secondary | ICD-10-CM | POA: Diagnosis not present

## 2024-02-21 DIAGNOSIS — I447 Left bundle-branch block, unspecified: Secondary | ICD-10-CM | POA: Diagnosis present

## 2024-02-21 DIAGNOSIS — I249 Acute ischemic heart disease, unspecified: Secondary | ICD-10-CM | POA: Diagnosis not present

## 2024-02-21 DIAGNOSIS — R072 Precordial pain: Secondary | ICD-10-CM | POA: Diagnosis not present

## 2024-02-21 DIAGNOSIS — R7989 Other specified abnormal findings of blood chemistry: Principal | ICD-10-CM

## 2024-02-21 DIAGNOSIS — K449 Diaphragmatic hernia without obstruction or gangrene: Secondary | ICD-10-CM | POA: Diagnosis not present

## 2024-02-21 DIAGNOSIS — I110 Hypertensive heart disease with heart failure: Secondary | ICD-10-CM | POA: Diagnosis not present

## 2024-02-21 DIAGNOSIS — I214 Non-ST elevation (NSTEMI) myocardial infarction: Principal | ICD-10-CM | POA: Diagnosis present

## 2024-02-21 DIAGNOSIS — I219 Acute myocardial infarction, unspecified: Secondary | ICD-10-CM

## 2024-02-21 DIAGNOSIS — R918 Other nonspecific abnormal finding of lung field: Secondary | ICD-10-CM | POA: Diagnosis not present

## 2024-02-21 DIAGNOSIS — M8580 Other specified disorders of bone density and structure, unspecified site: Secondary | ICD-10-CM | POA: Diagnosis not present

## 2024-02-21 DIAGNOSIS — Z79899 Other long term (current) drug therapy: Secondary | ICD-10-CM | POA: Diagnosis not present

## 2024-02-21 DIAGNOSIS — Z888 Allergy status to other drugs, medicaments and biological substances status: Secondary | ICD-10-CM

## 2024-02-21 DIAGNOSIS — M40209 Unspecified kyphosis, site unspecified: Secondary | ICD-10-CM | POA: Diagnosis not present

## 2024-02-21 HISTORY — DX: Acute myocardial infarction, unspecified: I21.9

## 2024-02-21 LAB — CBC
HCT: 41.4 % (ref 36.0–46.0)
Hemoglobin: 13.6 g/dL (ref 12.0–15.0)
MCH: 28.8 pg (ref 26.0–34.0)
MCHC: 32.9 g/dL (ref 30.0–36.0)
MCV: 87.5 fL (ref 80.0–100.0)
Platelets: 263 K/uL (ref 150–400)
RBC: 4.73 MIL/uL (ref 3.87–5.11)
RDW: 14.6 % (ref 11.5–15.5)
WBC: 7.8 K/uL (ref 4.0–10.5)
nRBC: 0 % (ref 0.0–0.2)

## 2024-02-21 LAB — BASIC METABOLIC PANEL WITH GFR
Anion gap: 13 (ref 5–15)
BUN: 17 mg/dL (ref 8–23)
CO2: 23 mmol/L (ref 22–32)
Calcium: 9.4 mg/dL (ref 8.9–10.3)
Chloride: 104 mmol/L (ref 98–111)
Creatinine, Ser: 0.8 mg/dL (ref 0.44–1.00)
GFR, Estimated: >60 mL/min (ref >60)
Glucose, Bld: 101 mg/dL — ABNORMAL HIGH (ref 70–99)
Potassium: 3.4 mmol/L — ABNORMAL LOW (ref 3.5–5.1)
Sodium: 139 mmol/L (ref 135–145)

## 2024-02-21 LAB — HEPARIN LEVEL (UNFRACTIONATED): Heparin Unfractionated: 0.34 [IU]/mL (ref 0.30–0.70)

## 2024-02-21 LAB — TROPONIN T, HIGH SENSITIVITY
Troponin T High Sensitivity: 516 ng/L (ref 0–19)
Troponin T High Sensitivity: 443 ng/L (ref 0–19)

## 2024-02-21 MED ORDER — ASPIRIN 81 MG PO TBEC
81.0000 mg | DELAYED_RELEASE_TABLET | Freq: Every day | ORAL | Status: DC
  Administered 2024-02-22 – 2024-02-23 (×2): 81 mg via ORAL
  Filled 2024-02-21 (×2): qty 1

## 2024-02-21 MED ORDER — IOHEXOL 350 MG/ML SOLN
75.0000 mL | Freq: Once | INTRAVENOUS | Status: AC | PRN
  Administered 2024-02-21: 75 mL via INTRAVENOUS

## 2024-02-21 MED ORDER — NITROGLYCERIN 0.4 MG SL SUBL
0.4000 mg | SUBLINGUAL_TABLET | SUBLINGUAL | Status: DC | PRN
  Administered 2024-02-21 (×3): 0.4 mg via SUBLINGUAL
  Filled 2024-02-21: qty 1

## 2024-02-21 MED ORDER — ASPIRIN 81 MG PO CHEW
324.0000 mg | CHEWABLE_TABLET | Freq: Once | ORAL | Status: AC
  Administered 2024-02-21: 324 mg via ORAL
  Filled 2024-02-21: qty 4

## 2024-02-21 MED ORDER — HEPARIN BOLUS VIA INFUSION
4000.0000 [IU] | Freq: Once | INTRAVENOUS | Status: AC
  Administered 2024-02-21: 4000 [IU] via INTRAVENOUS

## 2024-02-21 MED ORDER — DEXTROSE IN LACTATED RINGERS 5 % IV SOLN: INTRAVENOUS | Status: AC

## 2024-02-21 MED ORDER — PANTOPRAZOLE SODIUM 40 MG PO TBEC
40.0000 mg | DELAYED_RELEASE_TABLET | Freq: Every day | ORAL | Status: DC
  Administered 2024-02-22 – 2024-02-23 (×2): 40 mg via ORAL
  Filled 2024-02-21 (×2): qty 1

## 2024-02-21 MED ORDER — HEPARIN (PORCINE) 25000 UT/250ML-% IV SOLN
1100.0000 [IU]/h | INTRAVENOUS | Status: DC
  Administered 2024-02-21: 950 [IU]/h via INTRAVENOUS
  Filled 2024-02-21: qty 250

## 2024-02-21 NOTE — ED Provider Notes (Signed)
 [place] EMERGENCY DEPARTMENT AT [facility] Provider Note   CSN: [id number] Arrival date & time: 02/21/24  1640     Patient presents with: Chest Pain   Rachael Nguyen is a 73 y.o. female with PMHx HLD who presents to ED concerned for central chest pain/pressure since this morning. Pain started around 11:30AM when patient standing and speaking at church. Pain is described as a constant chest pressure. Pain is not worsened with exertion. Patient did not take any OTC medication for her symptoms at home.   Patient recently stopped taking HLD medication last week. Patient is on GERD medications.  Denies fever, dyspnea, cough, nausea, vomiting, diarrhea. Denies recent surgery/immobilization, hx DVT/PE, hemoptysis, hx cancer in the past 6 months, calf swelling/tenderness.      Chest Pain      Prior to Admission medications   Medication Sig Start Date End Date Taking? Authorizing Provider  Ascorbic Acid (VITAMIN C ADULT GUMMIES PO) Take by mouth daily.    [provider]  cholecalciferol (VITAMIN D3) 25 MCG (1000 UNIT) tablet Take 2,000 Units by mouth daily.    [provider]  famotidine (PEPCID) 20 MG tablet Take 20 mg by mouth daily.    [provider]  hydrochlorothiazide  (MICROZIDE ) 12.5 MG capsule Take 1 capsule (12.5 mg total) by mouth daily. 04/17/23   Jolinda Norene HERO, DO  levocetirizine (XYZAL ) 5 MG tablet TAKE 0.5 TABLETS (2.5 MG TOTAL) BY MOUTH EVERY EVENING. Patient not taking: Reported on 02/13/2024 01/22/24   Jolinda Norene HERO, DO  nystatin  powder Apply 1 Application topically 3 (three) times daily. X7-10 days per flareup of rash under breasts 10/12/22   Jolinda Norene M, DO  Zinc Citrate (ZINC GUMMY PO) Take 11 mg by mouth at bedtime.    [provider]    Allergies: Fexofenadine, Hydrocodone -acetaminophen, Vicodin [hydrocodone -acetaminophen], Allegra-d [fexofenadine-pseudoephed er], and Codeine    Review of Systems   Cardiovascular:  Positive for chest pain.    Updated Vital Signs BP 102/70   Pulse 75   Temp 98 F (36.7 C)   Resp 12   Ht 5' 3 (1.6 m)   Wt 102.1 kg   SpO2 96%   BMI 39.87 kg/m   Physical Exam Vitals and nursing note reviewed.  Constitutional:      General: She is not in acute distress.    Appearance: She is not ill-appearing, toxic-appearing or diaphoretic.  HENT:     Head: Normocephalic and atraumatic.     Mouth/Throat:     Mouth: Mucous membranes are moist.     Pharynx: No oropharyngeal exudate or posterior oropharyngeal erythema.  Eyes:     General: No scleral icterus.       Right eye: No discharge.        Left eye: No discharge.     Conjunctiva/sclera: Conjunctivae normal.  Cardiovascular:     Rate and Rhythm: Normal rate and regular rhythm.     Pulses: Normal pulses.     Heart sounds: Normal heart sounds. No murmur heard. Pulmonary:     Effort: Pulmonary effort is normal. No respiratory distress.     Breath sounds: Normal breath sounds. No wheezing, rhonchi or rales.  Abdominal:     General: Bowel sounds are normal.     Palpations: Abdomen is soft. There is no mass.     Tenderness: There is no abdominal tenderness.  Musculoskeletal:     Right lower leg: No edema.     Left lower leg: No  edema.     Comments: No calf tenderness to palpation  Skin:    General: Skin is warm and dry.     Findings: No rash.  Neurological:     General: No focal deficit present.     Mental Status: She is alert and oriented to person, place, and time. Mental status is at baseline.  Psychiatric:        Mood and Affect: Mood normal.        Behavior: Behavior normal.     (all labs ordered are listed, but only abnormal results are displayed) Labs Reviewed  BASIC METABOLIC PANEL WITH GFR - Abnormal; Notable for the following components:      Result Value   Potassium 3.4 (*)    Glucose, Bld 101 (*)    All other components within normal limits  TROPONIN T, HIGH SENSITIVITY -  Abnormal; Notable for the following components:   Troponin T High Sensitivity 516 (*)    All other components within normal limits  TROPONIN T, HIGH SENSITIVITY - Abnormal; Notable for the following components:   Troponin T High Sensitivity 443 (*)    All other components within normal limits  CBC  HEPARIN  LEVEL (UNFRACTIONATED)  HEPARIN  LEVEL (UNFRACTIONATED)  CBC    EKG: EKG Interpretation Date/Time:  Wednesday February 21 2024 16:50:48 EDT Ventricular Rate:  92 PR Interval:  164 QRS Duration:  145 QT Interval:  396 QTC Calculation: 490 R Axis:   13  Text Interpretation: Sinus rhythm Supraventricular bigeminy Left bundle branch block Confirmed by Yolande Charleston [id number]) on 02/21/2024 5:21:08 PM  Radiology: CT Angio Chest PE W/Cm &/Or Wo Cm Result Date: 02/21/2024 CLINICAL DATA:  Concern for pulmonary embolism. EXAM: CT ANGIOGRAPHY CHEST WITH CONTRAST TECHNIQUE: Multidetector CT imaging of the chest was performed using the standard protocol during bolus administration of intravenous contrast. Multiplanar CT image reconstructions and MIPs were obtained to evaluate the vascular anatomy. RADIATION DOSE REDUCTION: This exam was performed according to the departmental dose-optimization program which includes automated exposure control, adjustment of the mA and/or kV according to patient size and/or use of iterative reconstruction technique. CONTRAST:  75mL OMNIPAQUE  IOHEXOL  350 MG/ML SOLN COMPARISON:  Chest CT dated 12/05/2023. FINDINGS: Cardiovascular: There is no cardiomegaly or pericardial effusion. The thoracic aorta is unremarkable. No pulmonary artery embolus identified. Mediastinum/Nodes: No hilar or mediastinal adenopathy. Small hiatal hernia. The esophagus is grossly unremarkable. No mediastinal fluid collection. Lungs/Pleura: No focal consolidation, pleural effusion, pneumothorax. The central airways are patent. Upper Abdomen: No acute abnormality. Musculoskeletal: Degenerative changes  of the spine. No acute osseous pathology. Review of the MIP images confirms the above findings. IMPRESSION: 1. No acute intrathoracic pathology. No CT evidence of pulmonary artery embolus. 2. Small hiatal hernia. Electronically Signed   By: Vanetta Chou M.D.   On: 02/21/2024 18:47   DG Chest 2 View Result Date: 02/21/2024 CLINICAL DATA:  Chest pain EXAM: CHEST - 2 VIEW COMPARISON:  Chest radiograph June 15, 2022 FINDINGS: The heart size is borderline normal. Mediastinal contours are within normal limits. Suggestion of bronchial wall thickening. Otherwise both lungs are clear. The visualized skeletal structures are unremarkable. Increased dorsal kyphosis and degenerative changes of the spine. IMPRESSION: Bronchial wall thickening suggestive of medium and large size airway disease. No consolidation. Electronically Signed   By: Megan  Zare M.D.   On: 02/21/2024 17:55     .Critical Care  Performed by: Hoy Nidia FALCON, PA-C Authorized by: Hoy Nidia FALCON, PA-C   Critical care provider statement:  Critical care time (minutes):  30   Critical care was necessary to treat or prevent imminent or life-threatening deterioration of the following conditions: elevated troponin.   Critical care was time spent personally by me on the following activities:  Development of treatment plan with patient or surrogate, discussions with consultants, evaluation of patient's response to treatment, examination of patient, ordering and review of laboratory studies, ordering and review of radiographic studies, ordering and performing treatments and interventions, pulse oximetry, re-evaluation of patient's condition and review of old charts   Care discussed with: admitting provider   Comments:     Chest pain and elevated troponin.    Medications Ordered in the ED  nitroGLYCERIN  (NITROSTAT ) SL tablet 0.4 mg (0.4 mg Sublingual Given 02/21/24 1833)  heparin  bolus via infusion 4,000 Units (4,000 Units  Intravenous Bolus from Bag 02/21/24 1842)    Followed by  heparin  ADULT infusion 100 units/mL (25000 units/250mL) (950 Units/hr Intravenous New Bag/Given 02/21/24 1845)  aspirin  chewable tablet 324 mg (324 mg Oral Given 02/21/24 1754)  iohexol  (OMNIPAQUE ) 350 MG/ML injection 75 mL (75 mLs Intravenous Contrast Given 02/21/24 1824)                                    Medical Decision Making Amount and/or Complexity of Data Reviewed Labs: ordered. Radiology: ordered.  Risk OTC drugs. Prescription drug management. Decision regarding hospitalization.   This patient presents to the ED for concern of chest pain, this involves an extensive number of treatment options, and is a complaint that carries with it a high risk of complications and morbidity.  The differential diagnosis includes acute coronary syndrome, congestive heart failure, pericarditis, pneumonia, pulmonary embolism, tension pneumothorax, esophageal rupture, aortic dissection, cardiac tamponade, musculoskeletal   Co morbidities that complicate the patient evaluation  HLD   Additional history obtained:  12/2023 CT cardiac scoring: coronary calcium  score of 0. 11/2020 ECHO: 60-65% EF   Problem List / ED Course / Critical interventions / Medication management  Patient presented for chest pain/pressure since 11:30AM. Physical exam reassuring. Patient afebrile with stable vitals.  I Ordered, and personally interpreted labs.  Initial troponin 516 downtrending to 443.  CBC without leukocytosis or anemia.  BMP with mild hypokalemia 3.4. The patient was maintained on a cardiac monitor.  I personally viewed and interpreted the EKG/cardiac monitored which showed an underlying rhythm of: no acute changes. I ordered imaging studies including chest xray/CTA chest to assess for process contributing to patient's symptoms. I independently visualized and interpreted imaging which showed bronchitis. I agree with the radiologist  interpretation. Patient given NTG with some improvement of symptoms. Patient started on heparin  given elevated troponin and chest pain. I requested consultation with the cardiologist on-call Dr. Orlando,  and discussed lab and imaging findings as well as pertinent plan - they recommend: hospitalist admission for elevated troponin. Staffed with Dr. Yolande. Dr. Lee admitting provider. I have reviewed the patients home medicines and have made adjustments as needed  Social Determinants of Health:  geriatric      Final diagnoses:  Elevated troponin  Chest pain, unspecified type    ED Discharge Orders     None          Hoy Nidia JULIANNA DEVONNA 02/21/24 2125    Yolande Lamar BROCKS, MD 02/23/24 FONTAINE

## 2024-02-21 NOTE — Progress Notes (Signed)
 PHARMACY - ANTICOAGULATION CONSULT NOTE  Pharmacy Consult for IV Heparin  Indication: ACS/STEMI  Allergies  Allergen Reactions   Fexofenadine Other (See Comments)   Hydrocodone -Acetaminophen    Vicodin [Hydrocodone -Acetaminophen] Nausea Only    dizziness   Allegra-D [Fexofenadine-Pseudoephed Er] Nausea Only   Codeine Other (See Comments)    GI upset; dizziness    Patient Measurements: Height: 5' 3 (160 cm) Weight: 102.1 kg (225 lb 1.4 oz) IBW/kg (Calculated) : 52.4 HEPARIN  DW (KG): 76.5  Vital Signs: Temp: 98 F (36.7 C) (08/20 1647) BP: 135/83 (08/20 1809) Pulse Rate: 93 (08/20 1809)  Labs: Recent Labs    02/21/24 1710  HGB 13.6  HCT 41.4  PLT 263  CREATININE 0.80    Estimated Creatinine Clearance: 74.7 mL/min (by C-G formula based on SCr of 0.8 mg/dL).   Medical History: Past Medical History:  Diagnosis Date   Hyperlipidemia    Osteopenia 04/2015 T score -2.4   FRAX not calculated    Medications:  (Not in a hospital admission)  Scheduled:  Infusions:  PRN: nitroGLYCERIN   Assessment: 73 YO F with PMH of hyperlipidemia presents to the ED with concerns for central chest pain/pressure since this morning. Patient has a troponin of 516. Pharmacy consulted for IV Heparin  for ACS/STEMI.  Hgb 13.6, Hct 41.4, Plt 263 Goal of Therapy:  Heparin  level 0.3-0.7 units/ml Monitor platelets by anticoagulation protocol: Yes   Plan:  Give 4000 units bolus x 1 Start heparin  infusion at 950 units/hr Check anti-Xa level in 6 hours and daily while on heparin  Continue to monitor H&H and platelets  R. Samual Satterfield, PharmD PGY-1 Acute Care Pharmacy Resident [facility] 02/21/2024 6:27 PM

## 2024-02-21 NOTE — ED Triage Notes (Signed)
 C/o central CP and pressure since this morning. No cardiac hx.

## 2024-02-21 NOTE — Consult Note (Signed)
 Cardiology Consultation   Patient ID: Rachael Nguyen MRN: [id number]; DOB: Mar 10, 1951  Admit date: 02/21/2024 Date of Consult: 02/21/2024  PCP:  Jolinda Norene HERO, DO   [facility] Providers Cardiologist:  Lynwood Schilling, MD     Patient Profile: Rachael Nguyen is a 73 y.o. female with a hx of hld who is being seen 02/21/2024 for the evaluation of chest pain and elevated troponin at the request of Dr. Lee.  History of Present Illness: Rachael Nguyen was speaking at her [employer] earlier this morning around 11:30am on Wednesday when she developed an acute onset of substernal chest pressure. She went home and tried to rest but the pain persisted. After taking a nap, she noted that the pain has intensified, and she presented to the ED for further evaluation. The pain was an 8 out of 10 at its peak. She denies any radiating pain or nausea. No shortness of breath, leg swelling, orthopnea, or PND. She does not smoke. No recent illness. Has had considerable stress as her husband was recently diagnosed with prostate cancer and has started to undergo treatment.  Of note, the patient had previously been on 40mg of pravastatin  but had stopped this due to a CAC score of 0. She had hematuria back in July without a clear etiology (no imaging done and no evidence of UTI).   She presented to [facility] ED and was loaded with 324mg  of ASA and started on a heparin  gtt. Troponin trend 516-> 443. She also received 3x nitroglycerin  at 0.4mg  per dose.   She is currently chest pain free.    Past Medical History:  Diagnosis Date   Hyperlipidemia    Osteopenia 04/2015 T score -2.4   FRAX not calculated    Past Surgical History:  Procedure Laterality Date   COMBINED HYSTEROSCOPY DIAGNOSTIC / D&C  2011   polyp   DENTAL SURGERY     3 teeth extracted     Home Medications:  Prior to Admission medications   Medication Sig Start Date End Date Taking? Authorizing Provider  Ascorbic Acid (VITAMIN  C ADULT GUMMIES PO) Take by mouth daily.    [provider]  cholecalciferol (VITAMIN D3) 25 MCG (1000 UNIT) tablet Take 2,000 Units by mouth daily.    [provider]  famotidine (PEPCID) 20 MG tablet Take 20 mg by mouth daily.    [provider]  hydrochlorothiazide  (MICROZIDE ) 12.5 MG capsule Take 1 capsule (12.5 mg total) by mouth daily. 04/17/23   Jolinda Norene HERO, DO  levocetirizine (XYZAL ) 5 MG tablet TAKE 0.5 TABLETS (2.5 MG TOTAL) BY MOUTH EVERY EVENING. Patient not taking: Reported on 02/13/2024 01/22/24   Jolinda Norene HERO, DO  nystatin  powder Apply 1 Application topically 3 (three) times daily. X7-10 days per flareup of rash under breasts 10/12/22   Jolinda Norene M, DO  Zinc Citrate (ZINC GUMMY PO) Take 11 mg by mouth at bedtime.    [provider]    Scheduled Meds:  [START ON 02/22/2024] aspirin  EC  81 mg Oral Daily   [START ON 02/22/2024] pantoprazole   40 mg Oral Daily   Continuous Infusions:  dextrose  5% lactated ringers  75 mL/hr at 02/21/24 2212   heparin  950 Units/hr (02/21/24 1845)   PRN Meds: nitroGLYCERIN   Allergies:    Allergies  Allergen Reactions   Fexofenadine Other (See Comments)   Hydrocodone -Acetaminophen    Vicodin [Hydrocodone -Acetaminophen] Nausea Only    dizziness   Allegra-D [Fexofenadine-Pseudoephed Er] Nausea Only   Codeine Other (See  Comments)    GI upset; dizziness    Social History:   Social History   Socioeconomic History   Marital status: Married    Spouse name: Not on file   Number of children: 0   Years of education: Not on file   Highest education level: High school graduate  Occupational History   Occupation: Retired  Tobacco Use   Smoking status: Never   Smokeless tobacco: Never  Vaping Use   Vaping status: Never Used  Substance and Sexual Activity   Alcohol use: No    Alcohol/week: 0.0 standard drinks of alcohol   Drug use: No   Sexual activity: Yes  Other Topics Concern   Not  on file  Social History Narrative   Married.     Social Drivers of Corporate investment banker Strain: Low Risk  (12/05/2022)   Overall Financial Resource Strain (CARDIA)    Difficulty of Paying Living Expenses: Not hard at all  Food Insecurity: No Food Insecurity (12/05/2022)   Hunger Vital Sign    Worried About Running Out of Food in the Last Year: Never true    Ran Out of Food in the Last Year: Never true  Transportation Needs: No Transportation Needs (12/05/2022)   PRAPARE - Administrator, Civil Service (Medical): No    Lack of Transportation (Non-Medical): No  Physical Activity: Sufficiently Active (12/05/2022)   Exercise Vital Sign    Days of Exercise per Week: 3 days    Minutes of Exercise per Session: 60 min  Stress: No Stress Concern Present (12/05/2022)   [employer] of Occupational Health - Occupational Stress Questionnaire    Feeling of Stress : Not at all  Social Connections: Socially Integrated (12/05/2022)   Social Connection and Isolation Panel    Frequency of Communication with Friends and Family: More than three times a week    Frequency of Social Gatherings with Friends and Family: More than three times a week    Attends Religious Services: More than 4 times per year    Active Member of [employer] or Organizations: Yes    Attends Engineer, structural: More than 4 times per year    Marital Status: Married  Catering manager Violence: Not At Risk (12/05/2022)   Humiliation, Afraid, Rape, and Kick questionnaire    Fear of Current or Ex-Partner: No    Emotionally Abused: No    Physically Abused: No    Sexually Abused: No    Family History:   Family History  Problem Relation Age of Onset   Breast cancer Paternal Aunt    Hypertension Mother    Dementia Mother    Leukemia Father    Colon cancer Neg Hx    Esophageal cancer Neg Hx    Pancreatic cancer Neg Hx    Rectal cancer Neg Hx    Stomach cancer Neg Hx    Ulcerative colitis Neg Hx    Crohn's  disease Neg Hx   No concerning cardiac family history   ROS:  Please see the history of present illness.  All other ROS reviewed and negative.     Physical Exam/Data: Vitals:   02/21/24 2000 02/21/24 2040 02/21/24 2100 02/21/24 2200  BP: 102/70 108/72 104/72 120/69  Pulse: 75 76 76 79  Resp: 12 11 11 14   Temp:      SpO2: 96% 97% 97% 97%  Weight:      Height:       No intake or output  data in the 24 hours ending 02/21/24 2250    02/21/2024    6:00 PM 02/13/2024    2:31 PM 12/26/2023    2:14 PM  Last 3 Weights  Weight (lbs) 225 lb 1.4 oz 225 lb 225 lb 9.6 oz  Weight (kg) 102.1 kg 102.059 kg 102.331 kg     Body mass index is 39.87 kg/m.  General:  Well nourished, well developed, in no acute distress HEENT: normal Neck: no JVD Vascular: No carotid bruits; Distal pulses 2+ bilaterally Cardiac:  normal S1, S2; RRR; no murmur  Lungs:  clear to auscultation bilaterally, no wheezing, rhonchi or rales  Abd: soft, nontender, no hepatomegaly  Ext: no edema Musculoskeletal:  No deformities, BUE and BLE strength normal and equal Skin: warm and dry  Neuro:  CNs 2-12 intact, no focal abnormalities noted Psych:  Normal affect   EKG:  The EKG was personally reviewed and demonstrates:  SR with LBBB Telemetry:  Telemetry was personally reviewed and demonstrates:  SR, LBBB  Relevant CV Studies:  CT Cardiac Scoring  12/05/2023 IMPRESSION: 1. Coronary calcium  score of 0. IMPRESSION: 1. Small hiatal hernia. 2. Chronic compression deformity involving a vertebral body within the midthoracic spine.  Laboratory Data: Chemistry Recent Labs  Lab 02/21/24 1710  NA 139  K 3.4*  CL 104  CO2 23  GLUCOSE 101*  BUN 17  CREATININE 0.80  CALCIUM  9.4  GFRNONAA >60  ANIONGAP 13    Hematology Recent Labs  Lab 02/21/24 1710  WBC 7.8  RBC 4.73  HGB 13.6  HCT 41.4  MCV 87.5  MCH 28.8  MCHC 32.9  RDW 14.6  PLT 263   Radiology/Studies:  CT Angio Chest PE W/Cm &/Or Wo Cm Result  Date: 02/21/2024 CLINICAL DATA:  Concern for pulmonary embolism. EXAM: CT ANGIOGRAPHY CHEST WITH CONTRAST TECHNIQUE: Multidetector CT imaging of the chest was performed using the standard protocol during bolus administration of intravenous contrast. Multiplanar CT image reconstructions and MIPs were obtained to evaluate the vascular anatomy. RADIATION DOSE REDUCTION: This exam was performed according to the departmental dose-optimization program which includes automated exposure control, adjustment of the mA and/or kV according to patient size and/or use of iterative reconstruction technique. CONTRAST:  75mL OMNIPAQUE  IOHEXOL  350 MG/ML SOLN COMPARISON:  Chest CT dated 12/05/2023. FINDINGS: Cardiovascular: There is no cardiomegaly or pericardial effusion. The thoracic aorta is unremarkable. No pulmonary artery embolus identified. Mediastinum/Nodes: No hilar or mediastinal adenopathy. Small hiatal hernia. The esophagus is grossly unremarkable. No mediastinal fluid collection. Lungs/Pleura: No focal consolidation, pleural effusion, pneumothorax. The central airways are patent. Upper Abdomen: No acute abnormality. Musculoskeletal: Degenerative changes of the spine. No acute osseous pathology. Review of the MIP images confirms the above findings. IMPRESSION: 1. No acute intrathoracic pathology. No CT evidence of pulmonary artery embolus. 2. Small hiatal hernia. Electronically Signed   By: Vanetta Chou M.D.   On: 02/21/2024 18:47   DG Chest 2 View Result Date: 02/21/2024 CLINICAL DATA:  Chest pain EXAM: CHEST - 2 VIEW COMPARISON:  Chest radiograph June 15, 2022 FINDINGS: The heart size is borderline normal. Mediastinal contours are within normal limits. Suggestion of bronchial wall thickening. Otherwise both lungs are clear. The visualized skeletal structures are unremarkable. Increased dorsal kyphosis and degenerative changes of the spine. IMPRESSION: Bronchial wall thickening suggestive of medium and large  size airway disease. No consolidation. Electronically Signed   By: Megan  Zare M.D.   On: 02/21/2024 17:55     Assessment and Plan: Suspicion for  NSTEMI, type 1  Chest pain  Elevated troponin  Presentation most concerning for NSTEMI type 1 given chest pain and elevated troponin. However, she curiously did not have any coronary artery calcium  on her CT scan and was taken off statin 1 week ago. Differential includes myocarditis and stress cardiomyopathy. She has not had any recent illness. Her husband was recently diagnosis with prostate cancer and has started to undergo treatment. This has caused her a decent amount of stress. Regardless, her coronary anatomy will need to be defined with an angiogram. - Aspirin  81mg  every day  - Start atorvastatin  40mg  daily - Heparin  gtt per pharmacy protocol  - PRN nitroglycerin  for chest pain  - ECG + troponin for worsening chest pain  - TTE in the AM  - NPO at midnight for likely coronary angiogram tomorrow - Monitor on telemetry  Risk Assessment/Risk Scores:   TIMI Risk Score for Unstable Angina or Non-ST Elevation MI:   The patient's TIMI risk score is 3, which indicates a 13% risk of all cause mortality, new or recurrent myocardial infarction or need for urgent revascularization in the next 14 days.  GRACE score 114    For questions or updates, please contact [facility] Please consult [email or web address] for contact info under    Signed, Jerrell DELENA Orchard, MD  02/21/2024 10:50 PM

## 2024-02-21 NOTE — Plan of Care (Addendum)
 [facility] emergency department to progressive unit [facility] transfer:  73 year old female history of LBBB, obesity, hypertension, anxiety, and hyperlipidemia presented emergency department with complaining of central chest pain chest pressure which is constant started at around 11:30 AM.  Patient stated that around 11:30AM when patient standing and speaking at church. Pain is described as a constant chest pressure. Pain is not worsened with exertion. Patient did not take any OTC medication for her symptoms at home.   At presentation to ED patient is hemodynamically stable.  EKG showed normal sinus rhythm heart rate 93, left bundle branch block and ventricular bigeminy.  CTA chest no evidence of pulmonary embolism.  Small hiatal hernia. Chest x-ray showing bilateral bronchial disease without consolidation.  Patient found to have elevated troponin 516 and 443. CBC unremarkable. BMP shows low potassium 3.4.  Per chart review patient has previous echo and CT cardiac scoring study in 12/2023 normal findings.  EDP consulted cardiology Dr. Orlando recommended admit under hospitalist service for elevated troponin and they will follow along.  In the ED patient has been started on heparin  drip in the setting of elevated troponin and chest pain and received aspirin  load.  Hospitalist has been consulted for management of nSTEM. Please inform on-call cardiology upon arrival to [facility].  Dyland Panuco, MD [facility] 02/21/2024, 9:26 PM

## 2024-02-22 ENCOUNTER — Encounter (HOSPITAL_COMMUNITY): Payer: Self-pay | Admitting: Internal Medicine

## 2024-02-22 ENCOUNTER — Encounter (HOSPITAL_COMMUNITY)
Admission: EM | Disposition: A | Discharge status: Home / Self Care | Payer: Self-pay | Source: Home / Self Care | Attending: Internal Medicine

## 2024-02-22 ENCOUNTER — Inpatient Hospital Stay (HOSPITAL_COMMUNITY)

## 2024-02-22 DIAGNOSIS — I214 Non-ST elevation (NSTEMI) myocardial infarction: Secondary | ICD-10-CM

## 2024-02-22 DIAGNOSIS — E876 Hypokalemia: Secondary | ICD-10-CM | POA: Diagnosis not present

## 2024-02-22 DIAGNOSIS — I249 Acute ischemic heart disease, unspecified: Secondary | ICD-10-CM

## 2024-02-22 HISTORY — PX: LEFT HEART CATH AND CORONARY ANGIOGRAPHY: CATH118249

## 2024-02-22 LAB — CBC
HCT: 39.5 % (ref 36.0–46.0)
Hemoglobin: 13 g/dL (ref 12.0–15.0)
MCH: 28.3 pg (ref 26.0–34.0)
MCHC: 32.9 g/dL (ref 30.0–36.0)
MCV: 86.1 fL (ref 80.0–100.0)
Platelets: 235 K/uL (ref 150–400)
RBC: 4.59 MIL/uL (ref 3.87–5.11)
RDW: 14.6 % (ref 11.5–15.5)
WBC: 7.3 K/uL (ref 4.0–10.5)
nRBC: 0 % (ref 0.0–0.2)

## 2024-02-22 LAB — LIPID PANEL
Cholesterol: 198 mg/dL (ref 0–200)
HDL: 62 mg/dL (ref >40)
LDL Cholesterol: 121 mg/dL — ABNORMAL HIGH (ref 0–99)
Total CHOL/HDL Ratio: 3.2 ratio
Triglycerides: 76 mg/dL (ref <150)
VLDL: 15 mg/dL (ref 0–40)

## 2024-02-22 LAB — ECHOCARDIOGRAM COMPLETE
Area-P 1/2: 3.99 cm2
Calc EF: 35.9 %
Height: 63 in
S' Lateral: 3.8 cm
Single Plane A2C EF: 32.6 %
Single Plane A4C EF: 34.6 %
Weight: 3545 [oz_av]

## 2024-02-22 LAB — COMPREHENSIVE METABOLIC PANEL WITH GFR
ALT: 23 U/L (ref 0–44)
AST: 24 U/L (ref 15–41)
Albumin: 3.4 g/dL — ABNORMAL LOW (ref 3.5–5.0)
Alkaline Phosphatase: 56 U/L (ref 38–126)
Anion gap: 12 (ref 5–15)
BUN: 12 mg/dL (ref 8–23)
CO2: 22 mmol/L (ref 22–32)
Calcium: 8.9 mg/dL (ref 8.9–10.3)
Chloride: 107 mmol/L (ref 98–111)
Creatinine, Ser: 0.76 mg/dL (ref 0.44–1.00)
GFR, Estimated: >60 mL/min (ref >60)
Glucose, Bld: 113 mg/dL — ABNORMAL HIGH (ref 70–99)
Potassium: 3.4 mmol/L — ABNORMAL LOW (ref 3.5–5.1)
Sodium: 141 mmol/L (ref 135–145)
Total Bilirubin: 1.1 mg/dL (ref 0.0–1.2)
Total Protein: 6.2 g/dL — ABNORMAL LOW (ref 6.5–8.1)

## 2024-02-22 LAB — TROPONIN I (HIGH SENSITIVITY)
Troponin I (High Sensitivity): 1121 ng/L (ref <18)
Troponin I (High Sensitivity): 836 ng/L (ref <18)

## 2024-02-22 LAB — HEPARIN LEVEL (UNFRACTIONATED): Heparin Unfractionated: 0.25 [IU]/mL — ABNORMAL LOW (ref 0.30–0.70)

## 2024-02-22 LAB — SURGICAL PCR SCREEN
MRSA, PCR: NEGATIVE
Staphylococcus aureus: NEGATIVE

## 2024-02-22 LAB — HIV ANTIBODY (ROUTINE TESTING W REFLEX): HIV Screen 4th Generation wRfx: NONREACTIVE

## 2024-02-22 SURGERY — LEFT HEART CATH AND CORONARY ANGIOGRAPHY
Anesthesia: LOCAL

## 2024-02-22 MED ORDER — LABETALOL HCL 5 MG/ML IV SOLN: 10.0000 mg | INTRAVENOUS | Status: AC | PRN

## 2024-02-22 MED ORDER — HEPARIN (PORCINE) IN NACL 1000-0.9 UT/500ML-% IV SOLN
INTRAVENOUS | Status: DC | PRN
  Administered 2024-02-22 (×2): 500 mL

## 2024-02-22 MED ORDER — IOHEXOL 350 MG/ML SOLN
INTRAVENOUS | Status: DC | PRN
  Administered 2024-02-22: 61 mL

## 2024-02-22 MED ORDER — ASPIRIN 81 MG PO CHEW: 81.0000 mg | CHEWABLE_TABLET | ORAL | Status: DC

## 2024-02-22 MED ORDER — METOPROLOL SUCCINATE 12.5 MG HALF TABLET
12.5000 mg | ORAL_TABLET | Freq: Every day | ORAL | Status: DC
  Administered 2024-02-22 – 2024-02-23 (×2): 12.5 mg via ORAL
  Filled 2024-02-22 (×2): qty 1

## 2024-02-22 MED ORDER — VERAPAMIL HCL 2.5 MG/ML IV SOLN
INTRAVENOUS | Status: DC | PRN
  Administered 2024-02-22: 10 mL via INTRA_ARTERIAL

## 2024-02-22 MED ORDER — MIDAZOLAM HCL 2 MG/2ML IJ SOLN
INTRAMUSCULAR | Status: AC
  Filled 2024-02-22: qty 2

## 2024-02-22 MED ORDER — MIDAZOLAM HCL 2 MG/2ML IJ SOLN
INTRAMUSCULAR | Status: DC | PRN
  Administered 2024-02-22: 1 mg via INTRAVENOUS

## 2024-02-22 MED ORDER — LIDOCAINE HCL (PF) 1 % IJ SOLN
INTRAMUSCULAR | Status: AC
  Filled 2024-02-22: qty 60

## 2024-02-22 MED ORDER — POTASSIUM CHLORIDE 20 MEQ PO PACK
20.0000 meq | PACK | Freq: Once | ORAL | Status: AC
  Administered 2024-02-22: 20 meq via ORAL
  Filled 2024-02-22: qty 1

## 2024-02-22 MED ORDER — PERFLUTREN LIPID MICROSPHERE
1.0000 mL | INTRAVENOUS | Status: AC | PRN
  Administered 2024-02-22: 3 mL via INTRAVENOUS

## 2024-02-22 MED ORDER — FENTANYL CITRATE (PF) 100 MCG/2ML IJ SOLN
INTRAMUSCULAR | Status: AC
  Filled 2024-02-22: qty 2

## 2024-02-22 MED ORDER — ATORVASTATIN CALCIUM 40 MG PO TABS
40.0000 mg | ORAL_TABLET | Freq: Every day | ORAL | Status: DC
  Administered 2024-02-22: 40 mg via ORAL
  Filled 2024-02-22 (×2): qty 1

## 2024-02-22 MED ORDER — HEPARIN SODIUM (PORCINE) 1000 UNIT/ML IJ SOLN
INTRAMUSCULAR | Status: AC
  Filled 2024-02-22: qty 10

## 2024-02-22 MED ORDER — VERAPAMIL HCL 2.5 MG/ML IV SOLN
INTRAVENOUS | Status: AC
  Filled 2024-02-22: qty 2

## 2024-02-22 MED ORDER — SODIUM CHLORIDE 0.9% FLUSH: 3.0000 mL | INTRAVENOUS | Status: DC | PRN

## 2024-02-22 MED ORDER — HEPARIN SODIUM (PORCINE) 1000 UNIT/ML IJ SOLN
INTRAMUSCULAR | Status: DC | PRN
  Administered 2024-02-22: 5000 [IU] via INTRA_ARTERIAL

## 2024-02-22 MED ORDER — HYDRALAZINE HCL 20 MG/ML IJ SOLN
10.0000 mg | INTRAMUSCULAR | Status: AC | PRN
Start: 2024-02-22 — End: 2024-02-22

## 2024-02-22 MED ORDER — FENTANYL CITRATE (PF) 100 MCG/2ML IJ SOLN
INTRAMUSCULAR | Status: DC | PRN
  Administered 2024-02-22: 25 ug via INTRAVENOUS

## 2024-02-22 MED ORDER — POTASSIUM CHLORIDE CRYS ER 20 MEQ PO TBCR
40.0000 meq | EXTENDED_RELEASE_TABLET | Freq: Once | ORAL | Status: AC
  Administered 2024-02-22: 40 meq via ORAL
  Filled 2024-02-22: qty 2

## 2024-02-22 MED ORDER — SODIUM CHLORIDE 0.9 % IV SOLN: 250.0000 mL | INTRAVENOUS | Status: DC | PRN

## 2024-02-22 MED ORDER — FREE WATER: 500.0000 mL | Freq: Once | Status: DC

## 2024-02-22 MED ORDER — SODIUM CHLORIDE 0.9% FLUSH
3.0000 mL | Freq: Two times a day (BID) | INTRAVENOUS | Status: DC
  Administered 2024-02-22: 3 mL via INTRAVENOUS

## 2024-02-22 MED ORDER — LOSARTAN POTASSIUM 25 MG PO TABS
12.5000 mg | ORAL_TABLET | Freq: Every day | ORAL | Status: DC
  Administered 2024-02-22 – 2024-02-23 (×2): 12.5 mg via ORAL
  Filled 2024-02-22 (×2): qty 1

## 2024-02-22 MED ORDER — LIDOCAINE HCL (PF) 1 % IJ SOLN
INTRAMUSCULAR | Status: DC | PRN
  Administered 2024-02-22: 2 mL

## 2024-02-22 MED ORDER — FREE WATER
500.0000 mL | Freq: Once | Status: AC
  Administered 2024-02-22: 500 mL via ORAL

## 2024-02-22 SURGICAL SUPPLY — 10 items
CATH DIAG 6FR JR4 (CATHETERS) IMPLANT
CATH INFINITI 5FR ANG PIGTAIL (CATHETERS) IMPLANT
CATH INFINITI AMBI 6FR TG (CATHETERS) IMPLANT
DEVICE RAD COMP TR BAND LRG (VASCULAR PRODUCTS) IMPLANT
GLIDESHEATH SLEND SS 6F .021 (SHEATH) IMPLANT
KIT SINGLE USE MANIFOLD (KITS) IMPLANT
PACK CARDIAC CATHETERIZATION (CUSTOM PROCEDURE TRAY) ×1 IMPLANT
SET ATX-X65L (MISCELLANEOUS) IMPLANT
WIRE EMERALD 3MM-J .035X260CM (WIRE) IMPLANT
WIRE HI TORQ VERSACORE-J 145CM (WIRE) IMPLANT

## 2024-02-22 NOTE — Progress Notes (Addendum)
  Progress Note  Patient Name: Rachael Nguyen Date of Encounter: 02/22/2024 [facility] Cardiologist: Lynwood Schilling, MD   Interval Summary    Overall, feels ok. Reports had chest pain when we talk with her about what's going.   Vital Signs Vitals:   02/21/24 2100 02/21/24 2200 02/21/24 2256 02/22/24 0123  BP: 104/72 120/69 (!) 142/92 129/85  Pulse: 76 79 88 80  Resp: 11 14 15 19   Temp:   98.4 F (36.9 C)   TempSrc:   Oral   SpO2: 97% 97%  97%  Weight:   100.5 kg   Height:   5' 3 (1.6 m)     Intake/Output Summary (Last 24 hours) at 02/22/2024 0707 Last data filed at 02/22/2024 0535 Gross per 24 hour  Intake 686.74 ml  Output 200 ml  Net 486.74 ml      02/21/2024   10:56 PM 02/21/2024    6:00 PM 02/13/2024    2:31 PM  Last 3 Weights  Weight (lbs) 221 lb 9 oz 225 lb 1.4 oz 225 lb  Weight (kg) 100.5 kg 102.1 kg 102.059 kg      Telemetry/ECG   Sinus Rhythm - Personally Reviewed  Physical Exam  GEN: No acute distress.   Neck: No JVD Cardiac: RRR, no murmurs, rubs, or gallops.  Respiratory: Clear to auscultation bilaterally. GI: Soft, nontender, non-distended  MS: No edema  Assessment & Plan   73 year old female with past medical history of hyperlipidemia who presented to [facility] with chest pain and NSTEMI.  NSTEMI -- Developed acute onset of substernal chest pressure yesterday morning that persisted throughout the day. -- High-sensitivity troponin 516>> 443>> 1121>> 836.  EKG shows sinus rhythm with known left bundle branch block -- Reports she has been under significant stress recently with husband diagnosed with prostate cancer and starting treatment. -- Given concern for ACS recommend undergoing coronary angiography -- Continue aspirin , IV heparin , atorvastatin  40 mg daily -- Echocardiogram pending  Informed Consent   Shared Decision Making/Informed Consent The risks [stroke (1 in 1000), death (1 in 1000), kidney failure [usually  temporary] (1 in 500), bleeding (1 in 200), allergic reaction [possibly serious] (1 in 200)], benefits (diagnostic support and management of coronary artery disease) and alternatives of a cardiac catheterization were discussed in detail with Rachael Nguyen and she is willing to proceed.     Hyperlipidemia -- check lipids -- started on atorvastatin  40 mg daily  Hypokalemia -- K + 3.4 -- suppl  For questions or updates, please contact  [facility] Please consult [email or web address] for contact info under       Signed, Manuelita Rummer, NP    Agree with note by Manuelita Rummer NP-C  Patient admitted with chest pain.  She had a non-STEMI.  She does have positive risk factors.  She is currently pain-free on IV heparin .  Plan diagnostic coronary angiogram today.  Dorn DOROTHA Lesches, M.D., FACP, [facility], LYNITA [facility] [address] [place], [place]  [address]  [phone] 02/22/2024 10:51 AM

## 2024-02-22 NOTE — Plan of Care (Signed)

## 2024-02-22 NOTE — Progress Notes (Signed)
 PHARMACY - ANTICOAGULATION CONSULT NOTE  Pharmacy Consult for IV Heparin  Indication: ACS/STEMI  Allergies  Allergen Reactions   Fexofenadine Other (See Comments)   Hydrocodone -Acetaminophen    Vicodin [Hydrocodone -Acetaminophen] Nausea Only    dizziness   Allegra-D [Fexofenadine-Pseudoephed Er] Nausea Only   Codeine Other (See Comments)    GI upset; dizziness    Patient Measurements: Height: 5' 3 (160 cm) Weight: 100.5 kg (221 lb 9 oz) IBW/kg (Calculated) : 52.4 HEPARIN  DW (KG): 76  Vital Signs: Temp: 98.4 F (36.9 C) (08/20 2256) Temp Source: Oral (08/20 2256) BP: 129/85 (08/21 0123) Pulse Rate: 80 (08/21 0123)  Labs: Recent Labs    02/21/24 1710 02/21/24 2324 02/22/24 0042 02/22/24 0213  HGB 13.6  --   --  13.0  HCT 41.4  --   --  39.5  PLT 263  --   --  235  HEPARINUNFRC  --  0.34  --  0.25*  CREATININE 0.80  --   --  0.76  TROPONINIHS  --   --  1,121* 836*    Estimated Creatinine Clearance: 74 mL/min (by C-G formula based on SCr of 0.76 mg/dL).   Medical History: Past Medical History:  Diagnosis Date   Hyperlipidemia    Osteopenia 04/2015 T score -2.4   FRAX not calculated    Medications:  Medications Prior to Admission  Medication Sig Dispense Refill Last Dose/Taking   Ascorbic Acid (VITAMIN C ADULT GUMMIES PO) Take by mouth daily.      cholecalciferol (VITAMIN D3) 25 MCG (1000 UNIT) tablet Take 2,000 Units by mouth daily.      famotidine (PEPCID) 20 MG tablet Take 20 mg by mouth daily.      hydrochlorothiazide  (MICROZIDE ) 12.5 MG capsule Take 1 capsule (12.5 mg total) by mouth daily. 90 capsule 3    levocetirizine (XYZAL ) 5 MG tablet TAKE 0.5 TABLETS (2.5 MG TOTAL) BY MOUTH EVERY EVENING. (Patient not taking: Reported on 02/13/2024) 15 tablet 5    nystatin  powder Apply 1 Application topically 3 (three) times daily. X7-10 days per flareup of rash under breasts 60 g 2    pravastatin  (PRAVACHOL ) 80 MG tablet Take 80 mg by mouth daily.       valACYclovir (VALTREX) 1000 MG tablet Take 1,000 mg by mouth 2 (two) times daily as needed (Flare ups).      Zinc Citrate (ZINC GUMMY PO) Take 11 mg by mouth at bedtime.      Scheduled:   aspirin  EC  81 mg Oral Daily   atorvastatin   40 mg Oral Daily   pantoprazole   40 mg Oral Daily   Infusions:   dextrose  5% lactated ringers  75 mL/hr at 02/21/24 2212   heparin  950 Units/hr (02/21/24 1845)   PRN: nitroGLYCERIN   Assessment: 73 YO F with PMH of hyperlipidemia presents to the ED with concerns for central chest pain/pressure since this morning. Patient has a troponin of 516. Pharmacy consulted for IV Heparin  for ACS/STEMI.   -heparin  level 0.25 on heparin  950 units/hr -plans for possible cath today  Goal of Therapy:  Heparin  level 0.3-0.7 units/ml Monitor platelets by anticoagulation protocol: Yes   Plan:  -Increase heparin  to 1100 units/hr -Heparin  level in 8 hrs or will follow plans post cath  Prentice Poisson, PharmD Clinical Pharmacist **Pharmacist phone directory can now be found on [email or web address] ([email or web address]).  Listed under [facility].

## 2024-02-22 NOTE — Progress Notes (Signed)
  Echocardiogram 2D Echocardiogram has been performed.  Rachael Nguyen 02/22/2024, 11:50 AM

## 2024-02-22 NOTE — Progress Notes (Signed)
 PHARMACY - ANTICOAGULATION CONSULT NOTE  Pharmacy Consult for IV Heparin  Indication: ACS/STEMI  Allergies  Allergen Reactions   Fexofenadine Other (See Comments)   Hydrocodone -Acetaminophen    Vicodin [Hydrocodone -Acetaminophen] Nausea Only    dizziness   Allegra-D [Fexofenadine-Pseudoephed Er] Nausea Only   Codeine Other (See Comments)    GI upset; dizziness    Patient Measurements: Height: 5' 3 (160 cm) Weight: 100.5 kg (221 lb 9 oz) IBW/kg (Calculated) : 52.4 HEPARIN  DW (KG): 76  Vital Signs: Temp: 98.4 F (36.9 C) (08/20 2256) Temp Source: Oral (08/20 2256) BP: 142/92 (08/20 2256) Pulse Rate: 88 (08/20 2256)  Labs: Recent Labs    02/21/24 1710 02/21/24 2324  HGB 13.6  --   HCT 41.4  --   PLT 263  --   HEPARINUNFRC  --  0.34  CREATININE 0.80  --     Estimated Creatinine Clearance: 74 mL/min (by C-G formula based on SCr of 0.8 mg/dL).   Medical History: Past Medical History:  Diagnosis Date   Hyperlipidemia    Osteopenia 04/2015 T score -2.4   FRAX not calculated    Medications:  Medications Prior to Admission  Medication Sig Dispense Refill Last Dose/Taking   Ascorbic Acid (VITAMIN C ADULT GUMMIES PO) Take by mouth daily.      cholecalciferol (VITAMIN D3) 25 MCG (1000 UNIT) tablet Take 2,000 Units by mouth daily.      famotidine (PEPCID) 20 MG tablet Take 20 mg by mouth daily.      hydrochlorothiazide  (MICROZIDE ) 12.5 MG capsule Take 1 capsule (12.5 mg total) by mouth daily. 90 capsule 3    levocetirizine (XYZAL ) 5 MG tablet TAKE 0.5 TABLETS (2.5 MG TOTAL) BY MOUTH EVERY EVENING. (Patient not taking: Reported on 02/13/2024) 15 tablet 5    nystatin  powder Apply 1 Application topically 3 (three) times daily. X7-10 days per flareup of rash under breasts 60 g 2    Zinc Citrate (ZINC GUMMY PO) Take 11 mg by mouth at bedtime.      Scheduled:   aspirin  EC  81 mg Oral Daily   pantoprazole   40 mg Oral Daily   Infusions:   dextrose  5% lactated ringers  75  mL/hr at 02/21/24 2212   heparin  950 Units/hr (02/21/24 1845)   PRN: nitroGLYCERIN   Assessment: 73 YO F with PMH of hyperlipidemia presents to the ED with concerns for central chest pain/pressure since this morning. Patient has a troponin of 516. Pharmacy consulted for IV Heparin  for ACS/STEMI.  Hgb 13.6, Hct 41.4, Plt 263  8/21 AM update:  Heparin  level therapeutic  Goal of Therapy:  Heparin  level 0.3-0.7 units/ml Monitor platelets by anticoagulation protocol: Yes   Plan:  Cont heparin  950 units/hr Heparin  level with AM labs  Lynwood Mckusick, PharmD, BCPS Clinical Pharmacist Phone: [phone]

## 2024-02-22 NOTE — Plan of Care (Signed)

## 2024-02-22 NOTE — Progress Notes (Signed)
 Critical Lab result Troponin 1121 Cardiology Dr. Orlando paged and made aware.

## 2024-02-22 NOTE — Interval H&P Note (Signed)
 History and Physical Interval Note:  02/22/2024 1:17 PM  Rachael Nguyen  has presented today for surgery, with the diagnosis of nstemi.  The various methods of treatment have been discussed with the patient and family. After consideration of risks, benefits and other options for treatment, the patient has consented to  Procedure(s): LEFT HEART CATH AND CORONARY ANGIOGRAPHY (N/A) as a surgical intervention.  The patient's history has been reviewed, patient examined, no change in status, stable for surgery.  I have reviewed the patient's chart and labs.  Questions were answered to the patient's satisfaction.     Orbie Grupe K Orell Hurtado

## 2024-02-22 NOTE — H&P (View-Only) (Signed)
  Progress Note  Patient Name: Rachael Nguyen Date of Encounter: 02/22/2024 [facility] Cardiologist: Lynwood Schilling, MD   Interval Summary    Overall, feels ok. Reports had chest pain when we talk with her about what's going.   Vital Signs Vitals:   02/21/24 2100 02/21/24 2200 02/21/24 2256 02/22/24 0123  BP: 104/72 120/69 (!) 142/92 129/85  Pulse: 76 79 88 80  Resp: 11 14 15 19   Temp:   98.4 F (36.9 C)   TempSrc:   Oral   SpO2: 97% 97%  97%  Weight:   100.5 kg   Height:   5' 3 (1.6 m)     Intake/Output Summary (Last 24 hours) at 02/22/2024 0707 Last data filed at 02/22/2024 0535 Gross per 24 hour  Intake 686.74 ml  Output 200 ml  Net 486.74 ml      02/21/2024   10:56 PM 02/21/2024    6:00 PM 02/13/2024    2:31 PM  Last 3 Weights  Weight (lbs) 221 lb 9 oz 225 lb 1.4 oz 225 lb  Weight (kg) 100.5 kg 102.1 kg 102.059 kg      Telemetry/ECG   Sinus Rhythm - Personally Reviewed  Physical Exam  GEN: No acute distress.   Neck: No JVD Cardiac: RRR, no murmurs, rubs, or gallops.  Respiratory: Clear to auscultation bilaterally. GI: Soft, nontender, non-distended  MS: No edema  Assessment & Plan   73 year old female with past medical history of hyperlipidemia who presented to [facility] with chest pain and NSTEMI.  NSTEMI -- Developed acute onset of substernal chest pressure yesterday morning that persisted throughout the day. -- High-sensitivity troponin 516>> 443>> 1121>> 836.  EKG shows sinus rhythm with known left bundle branch block -- Reports she has been under significant stress recently with husband diagnosed with prostate cancer and starting treatment. -- Given concern for ACS recommend undergoing coronary angiography -- Continue aspirin , IV heparin , atorvastatin  40 mg daily -- Echocardiogram pending  Informed Consent   Shared Decision Making/Informed Consent The risks [stroke (1 in 1000), death (1 in 1000), kidney failure [usually  temporary] (1 in 500), bleeding (1 in 200), allergic reaction [possibly serious] (1 in 200)], benefits (diagnostic support and management of coronary artery disease) and alternatives of a cardiac catheterization were discussed in detail with Rachael Nguyen and she is willing to proceed.     Hyperlipidemia -- check lipids -- started on atorvastatin  40 mg daily  Hypokalemia -- K + 3.4 -- suppl  For questions or updates, please contact  [facility] Please consult [email or web address] for contact info under       Signed, Manuelita Rummer, NP    Agree with note by Manuelita Rummer NP-C  Patient admitted with chest pain.  She had a non-STEMI.  She does have positive risk factors.  She is currently pain-free on IV heparin .  Plan diagnostic coronary angiogram today.  Dorn DOROTHA Lesches, M.D., FACP, [facility], LYNITA [facility] [address] [place], [place]  [address]  [phone] 02/22/2024 10:51 AM

## 2024-02-22 NOTE — H&P (Signed)
 History and Physical    Rachael Nguyen FMW:[id number] DOB: 02/24/1951 DOA: 02/21/2024  Patient coming from: Home.  Chief Complaint: Chest pain.  HPI: Rachael Nguyen is a 73 y.o. female with history of hypertension, hyperlipidemia who was recently taken off statins due to low calcium  score also recently traveled to [place]  for her wedding anniversary presents to the ER with complaint of chest pain.  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  This was not associated with any shortness of breath diaphoresis palpitations.  Following the talk she reached home when she still had chest pain she tried to take rest and when she woke up she still had the chest pressure and she decided to come to the ER.  Patient also recently was stressed out after her husband was diagnosed with prostate cancer.  ED Course: In the ER CT angiogram was negative for PE.  EKG shows sinus rhythm with LBBB.  Troponins were 516 443 and 1100.  Cardiology was consulted and patient was started on heparin  infusion and admitted for non-ST ovation MI.  Potassium was 3.4.  Review of Systems: As per HPI, rest all negative.   Past Medical History:  Diagnosis Date   Hyperlipidemia    Osteopenia 04/2015 T score -2.4   FRAX not calculated    Past Surgical History:  Procedure Laterality Date   COMBINED HYSTEROSCOPY DIAGNOSTIC / D&C  2011   polyp   DENTAL SURGERY     3 teeth extracted     reports that she has never smoked. She has never used smokeless tobacco. She reports that she does not drink alcohol and does not use drugs.  Allergies  Allergen Reactions   Fexofenadine Other (See Comments)   Hydrocodone -Acetaminophen    Vicodin [Hydrocodone -Acetaminophen] Nausea Only    dizziness   Allegra-D [Fexofenadine-Pseudoephed Er] Nausea Only   Codeine Other (See Comments)    GI upset; dizziness    Family History  Problem Relation Age  of Onset   Breast cancer Paternal Aunt    Hypertension Mother    Dementia Mother    Leukemia Father    Colon cancer Neg Hx    Esophageal cancer Neg Hx    Pancreatic cancer Neg Hx    Rectal cancer Neg Hx    Stomach cancer Neg Hx    Ulcerative colitis Neg Hx    Crohn's disease Neg Hx     Prior to Admission medications   Medication Sig Start Date End Date Taking? Authorizing Provider  Ascorbic Acid (VITAMIN C ADULT GUMMIES PO) Take by mouth daily.    [provider]  cholecalciferol (VITAMIN D3) 25 MCG (1000 UNIT) tablet Take 2,000 Units by mouth daily.    [provider]  famotidine (PEPCID) 20 MG tablet Take 20 mg by mouth daily.    [provider]  hydrochlorothiazide  (MICROZIDE ) 12.5 MG capsule Take 1 capsule (12.5 mg total) by mouth daily. 04/17/23   Jolinda Norene HERO, DO  levocetirizine (XYZAL ) 5 MG tablet TAKE 0.5 TABLETS (2.5 MG TOTAL) BY MOUTH EVERY EVENING. Patient not taking: Reported on 02/13/2024 01/22/24   Jolinda Norene HERO, DO  nystatin  powder Apply 1 Application topically 3 (three) times daily. X7-10 days per flareup of rash under breasts 10/12/22   Jolinda Norene M, DO  Zinc Citrate (ZINC GUMMY PO) Take 11 mg by mouth at bedtime.    [provider]  Physical Exam: Constitutional: Moderately built and nourished. Vitals:   02/21/24 2100 02/21/24 2200 02/21/24 2256 02/22/24 0123  BP: 104/72 120/69 (!) 142/92 129/85  Pulse: 76 79 88 80  Resp: 11 14 15 19   Temp:   98.4 F (36.9 C)   TempSrc:   Oral   SpO2: 97% 97%  97%  Weight:   100.5 kg   Height:   5' 3 (1.6 m)    Eyes: Anicteric no pallor. ENMT: No discharge from the ears eyes nose or mouth. Neck: No mass felt.  No neck rigidity. Respiratory: No rhonchi or crepitations. Cardiovascular: S1-S2 heard. Abdomen: Soft nontender bowel sound present. Musculoskeletal: No edema. Skin: No rash. Neurologic: Alert awake oriented to time place and person.  Moves all  extremities. Psychiatric: Appears normal.  Normal affect.   Labs on Admission: I have personally reviewed following labs and imaging studies  CBC: Recent Labs  Lab 02/21/24 1710 02/22/24 0213  WBC 7.8 7.3  HGB 13.6 13.0  HCT 41.4 39.5  MCV 87.5 86.1  PLT 263 235   Basic Metabolic Panel: Recent Labs  Lab 02/21/24 1710 02/22/24 0213  NA 139 141  K 3.4* 3.4*  CL 104 107  CO2 23 22  GLUCOSE 101* 113*  BUN 17 12  CREATININE 0.80 0.76  CALCIUM  9.4 8.9   GFR: Estimated Creatinine Clearance: 74 mL/min (by C-G formula based on SCr of 0.76 mg/dL). Liver Function Tests: Recent Labs  Lab 02/22/24 0213  AST 24  ALT 23  ALKPHOS 56  BILITOT 1.1  PROT 6.2*  ALBUMIN 3.4*   No results for input(s): LIPASE, AMYLASE in the last 168 hours. No results for input(s): AMMONIA in the last 168 hours. Coagulation Profile: No results for input(s): INR, PROTIME in the last 168 hours. Cardiac Enzymes: No results for input(s): CKTOTAL, CKMB, CKMBINDEX, TROPONINI in the last 168 hours. BNP (last 3 results) No results for input(s): PROBNP in the last 8760 hours. HbA1C: No results for input(s): HGBA1C in the last 72 hours. CBG: No results for input(s): GLUCAP in the last 168 hours. Lipid Profile: No results for input(s): CHOL, HDL, LDLCALC, TRIG, CHOLHDL, LDLDIRECT in the last 72 hours. Thyroid  Function Tests: No results for input(s): TSH, T4TOTAL, FREET4, T3FREE, THYROIDAB in the last 72 hours. Anemia Panel: No results for input(s): VITAMINB12, FOLATE, FERRITIN, TIBC, IRON, RETICCTPCT in the last 72 hours. Urine analysis:    Component Value Date/Time   COLORURINE YELLOW 01/04/2024 2353   APPEARANCEUR Clear 02/13/2024 1502   LABSPEC 1.017 01/04/2024 2353   PHURINE 5.5 01/04/2024 2353   GLUCOSEU Negative 02/13/2024 1502   HGBUR LARGE (A) 01/04/2024 2353   BILIRUBINUR Negative 02/13/2024 1502   KETONESUR NEGATIVE 01/04/2024  2353   PROTEINUR Negative 02/13/2024 1502   PROTEINUR NEGATIVE 01/04/2024 2353   NITRITE Negative 02/13/2024 1502   NITRITE NEGATIVE 01/04/2024 2353   LEUKOCYTESUR Trace (A) 02/13/2024 1502   LEUKOCYTESUR NEGATIVE 01/04/2024 2353   Sepsis Labs: @LABRCNTIP (procalcitonin:4,lacticidven:4) ) Recent Results (from the past 240 hours)  Microscopic Examination     Status: None   Collection Time: 02/13/24  3:02 PM   Urine  Result Value Ref Range Status   WBC, UA 0-5 0 - 5 /hpf Final   RBC, Urine 0-2 0 - 2 /hpf Final   Epithelial Cells (non renal) 0-10 0 - 10 /hpf Final   Bacteria, UA None seen None seen/Few Final  Surgical pcr screen     Status: None   Collection Time: 02/21/24 11:03  PM   Specimen: Nasal Mucosa; Nasal Swab  Result Value Ref Range Status   MRSA, PCR NEGATIVE NEGATIVE Final   Staphylococcus aureus NEGATIVE NEGATIVE Final    Comment: (NOTE) The Xpert SA Assay (FDA approved for NASAL specimens in patients 52 years of age and older), is one component of a comprehensive surveillance program. It is not intended to diagnose infection nor to guide or monitor treatment. Performed at [facility], [address], [place], [place] [address]      Radiological Exams on Admission: CT Angio Chest PE W/Cm &/Or Wo Cm Result Date: 02/21/2024 CLINICAL DATA:  Concern for pulmonary embolism. EXAM: CT ANGIOGRAPHY CHEST WITH CONTRAST TECHNIQUE: Multidetector CT imaging of the chest was performed using the standard protocol during bolus administration of intravenous contrast. Multiplanar CT image reconstructions and MIPs were obtained to evaluate the vascular anatomy. RADIATION DOSE REDUCTION: This exam was performed according to the departmental dose-optimization program which includes automated exposure control, adjustment of the mA and/or kV according to patient size and/or use of iterative reconstruction technique. CONTRAST:  75mL OMNIPAQUE  IOHEXOL  350 MG/ML SOLN COMPARISON:  Chest CT  dated 12/05/2023. FINDINGS: Cardiovascular: There is no cardiomegaly or pericardial effusion. The thoracic aorta is unremarkable. No pulmonary artery embolus identified. Mediastinum/Nodes: No hilar or mediastinal adenopathy. Small hiatal hernia. The esophagus is grossly unremarkable. No mediastinal fluid collection. Lungs/Pleura: No focal consolidation, pleural effusion, pneumothorax. The central airways are patent. Upper Abdomen: No acute abnormality. Musculoskeletal: Degenerative changes of the spine. No acute osseous pathology. Review of the MIP images confirms the above findings. IMPRESSION: 1. No acute intrathoracic pathology. No CT evidence of pulmonary artery embolus. 2. Small hiatal hernia. Electronically Signed   By: Vanetta Chou M.D.   On: 02/21/2024 18:47   DG Chest 2 View Result Date: 02/21/2024 CLINICAL DATA:  Chest pain EXAM: CHEST - 2 VIEW COMPARISON:  Chest radiograph June 15, 2022 FINDINGS: The heart size is borderline normal. Mediastinal contours are within normal limits. Suggestion of bronchial wall thickening. Otherwise both lungs are clear. The visualized skeletal structures are unremarkable. Increased dorsal kyphosis and degenerative changes of the spine. IMPRESSION: Bronchial wall thickening suggestive of medium and large size airway disease. No consolidation. Electronically Signed   By: Megan  Zare M.D.   On: 02/21/2024 17:55    EKG: Independently reviewed.  Normal sinus rhythm LBBB.  Assessment/Plan Principal Problem:   NSTEMI (non-ST elevated myocardial infarction) (HCC) Active Problems:   Hyperlipidemia   Essential hypertension   Hypokalemia   Non-STEMI (non-ST elevated myocardial infarction) (HCC)    Non-ST elevation MI -     appreciate cardiology consult.  Patient is placed on aspirin  heparin  infusion and statins and if heart rate allows beta-blockers.  N.p.o. in anticipation of cardiac cath.  Check 2D echo. Mild hypokalemia replace recheck.  Likely from  diuretics. Hypertension takes hydrochlorothiazide .  Follow blood pressure trends.   Hyperlipidemia on statins.  Check lipid panel.   Since patient has non-ST elevation MI will need close monitoring further workup and more than 2 midnight stay.   DVT prophylaxis: Heparin  infusion. Code Status: Full code. Family Communication: Discussed with patient. Disposition Plan: Monitored bed. Consults called: Cardiology. Admission status: Inpatient.

## 2024-02-23 ENCOUNTER — Encounter (HOSPITAL_COMMUNITY): Payer: Self-pay | Admitting: Internal Medicine

## 2024-02-23 ENCOUNTER — Other Ambulatory Visit (HOSPITAL_COMMUNITY): Payer: Self-pay

## 2024-02-23 DIAGNOSIS — I5181 Takotsubo syndrome: Secondary | ICD-10-CM

## 2024-02-23 DIAGNOSIS — I10 Essential (primary) hypertension: Secondary | ICD-10-CM | POA: Diagnosis not present

## 2024-02-23 DIAGNOSIS — I214 Non-ST elevation (NSTEMI) myocardial infarction: Secondary | ICD-10-CM | POA: Diagnosis not present

## 2024-02-23 DIAGNOSIS — E876 Hypokalemia: Secondary | ICD-10-CM | POA: Diagnosis not present

## 2024-02-23 DIAGNOSIS — E785 Hyperlipidemia, unspecified: Secondary | ICD-10-CM

## 2024-02-23 LAB — CBC
HCT: 40.6 % (ref 36.0–46.0)
Hemoglobin: 13.4 g/dL (ref 12.0–15.0)
MCH: 28.7 pg (ref 26.0–34.0)
MCHC: 33 g/dL (ref 30.0–36.0)
MCV: 86.9 fL (ref 80.0–100.0)
Platelets: 217 K/uL (ref 150–400)
RBC: 4.67 MIL/uL (ref 3.87–5.11)
RDW: 15 % (ref 11.5–15.5)
WBC: 6.1 K/uL (ref 4.0–10.5)
nRBC: 0 % (ref 0.0–0.2)

## 2024-02-23 LAB — MAGNESIUM: Magnesium: 1.8 mg/dL (ref 1.7–2.4)

## 2024-02-23 LAB — DIFFERENTIAL
Abs Immature Granulocytes: 0.01 K/uL (ref 0.00–0.07)
Basophils Absolute: 0.1 K/uL (ref 0.0–0.1)
Basophils Relative: 1 %
Eosinophils Absolute: 0.1 K/uL (ref 0.0–0.5)
Eosinophils Relative: 2 %
Immature Granulocytes: 0 %
Lymphocytes Relative: 36 %
Lymphs Abs: 2.2 K/uL (ref 0.7–4.0)
Monocytes Absolute: 0.5 K/uL (ref 0.1–1.0)
Monocytes Relative: 9 %
Neutro Abs: 3.2 K/uL (ref 1.7–7.7)
Neutrophils Relative %: 52 %

## 2024-02-23 LAB — BASIC METABOLIC PANEL WITH GFR
Anion gap: 5 (ref 5–15)
BUN: 10 mg/dL (ref 8–23)
CO2: 23 mmol/L (ref 22–32)
Calcium: 9 mg/dL (ref 8.9–10.3)
Chloride: 111 mmol/L (ref 98–111)
Creatinine, Ser: 0.87 mg/dL (ref 0.44–1.00)
GFR, Estimated: >60 mL/min (ref >60)
Glucose, Bld: 107 mg/dL — ABNORMAL HIGH (ref 70–99)
Potassium: 3.9 mmol/L (ref 3.5–5.1)
Sodium: 139 mmol/L (ref 135–145)

## 2024-02-23 LAB — TROPONIN I (HIGH SENSITIVITY): Troponin I (High Sensitivity): 158 ng/L (ref <18)

## 2024-02-23 LAB — HEPARIN LEVEL (UNFRACTIONATED): Heparin Unfractionated: <0.1 [IU]/mL — ABNORMAL LOW (ref 0.30–0.70)

## 2024-02-23 MED ORDER — METOPROLOL SUCCINATE ER 25 MG PO TB24
25.0000 mg | ORAL_TABLET | Freq: Every day | ORAL | 0 refills | Status: DC
  Filled 2024-02-23: qty 30, 30d supply, fill #0

## 2024-02-23 MED ORDER — ATORVASTATIN CALCIUM 40 MG PO TABS
40.0000 mg | ORAL_TABLET | Freq: Every day | ORAL | 0 refills | Status: DC
  Filled 2024-02-23: qty 30, 30d supply, fill #0

## 2024-02-23 MED ORDER — SPIRONOLACTONE 25 MG PO TABS
12.5000 mg | ORAL_TABLET | Freq: Every day | ORAL | 0 refills | Status: DC
  Filled 2024-02-23: qty 30, 60d supply, fill #0

## 2024-02-23 MED ORDER — ASPIRIN 81 MG PO TBEC
81.0000 mg | DELAYED_RELEASE_TABLET | Freq: Every day | ORAL | 12 refills | Status: AC
  Filled 2024-02-23: qty 30, 30d supply, fill #0

## 2024-02-23 MED ORDER — DAPAGLIFLOZIN PROPANEDIOL 10 MG PO TABS
10.0000 mg | ORAL_TABLET | Freq: Every day | ORAL | Status: DC
  Administered 2024-02-23: 10 mg via ORAL
  Filled 2024-02-23: qty 1

## 2024-02-23 MED ORDER — PRAVASTATIN SODIUM 80 MG PO TABS: 40.0000 mg | ORAL_TABLET | Freq: Every day | ORAL | Status: AC

## 2024-02-23 MED ORDER — DAPAGLIFLOZIN PROPANEDIOL 10 MG PO TABS
10.0000 mg | ORAL_TABLET | Freq: Every day | ORAL | 3 refills | Status: DC
  Filled 2024-02-23: qty 30, 30d supply, fill #0

## 2024-02-23 MED ORDER — NITROGLYCERIN 0.4 MG SL SUBL
0.4000 mg | SUBLINGUAL_TABLET | SUBLINGUAL | 0 refills | Status: AC | PRN
  Filled 2024-02-23: qty 25, 8d supply, fill #0

## 2024-02-23 MED ORDER — LOSARTAN POTASSIUM 25 MG PO TABS
25.0000 mg | ORAL_TABLET | Freq: Every day | ORAL | 0 refills | Status: DC
Start: 2024-02-23 — End: 2024-03-01
  Filled 2024-02-23: qty 30, 30d supply, fill #0

## 2024-02-23 NOTE — TOC CM/SW Note (Signed)
 Transition of Care [facility]) - Inpatient Brief Assessment   Patient Details  Name: Rachael Nguyen MRN: [id number] Date of Birth: 1951-01-06  Transition [facility] CM/SW Contact:    Lauraine FORBES Saa, LCSWA Phone Number: 02/23/2024, 9:36 AM   Clinical Narrative:  9:36 AM Per chart review, patient resides at home with spouse. Patient has a PCP and insurance. Patient does not have SNF/HH/DME history. Patient's preferred pharmacy is [facility] [address]. No TOC needs were identified at this time. TOC will continue to follow and be available to assist.  Transition of Care Asessment: Insurance and Status: Insurance coverage has been reviewed Patient has primary care physician: Yes Home environment has been reviewed: Private Residence Prior level of function:: N/A Prior/Current Home Services: No current home services Social Drivers of Health Review: SDOH reviewed no interventions necessary Readmission risk has been reviewed: Yes (Currently Green 9%) Transition of care needs: no transition of care needs at this time

## 2024-02-23 NOTE — Progress Notes (Signed)
 Heart Failure Nurse Navigator Progress Note  PCP: Jolinda Norene HERO, DO PCP-Cardiologist: [place] Admission Diagnosis: Elevated troponin, chest pain  Admitted from: Home  Presentation:   Rachael Nguyen presented with central chest pain and pressure that started in the morning.after standing at church and talking, went home and took a nap, when she woke up the pain had increased.  Patient recently stopped taking HLD medications last week.:BP 102/70, HR 75, Troponin 1,121, BMI 39.25, Potasium 3.4, Has been under a lot of stress, her husband recently diagnosed with prostate cancer. CTA negative for PE, CXR with Bronchial wall thickening suggestive of medium and large size airway disease. No consolidation. Cath 8/21 showing clean coronary arteries consistent with Takotsubo cardiomyopathy. Normal, right dominant coronary circulation without evidence of spasm, dissection, or bridging.   Patient and her husband were educated on the sign and symptoms of heart failure, daily weights, when to call his doctor or go to the ED. Diet/ fluid restrictions ( per family , the patient is a heavy salt user, and even carries bottles of salt and pepper in her purse). Continued education on taking all medications as prescribed and attending all medical appointments. Patient and her husband verbalized their understanding of all HF education. A HF TOC appointment was scheduled for 03/01/2024 @ 9:45 am.   ECHO/ LVEF: 20-25%   Clinical Course:  Past Medical History:  Diagnosis Date   Hyperlipidemia    Osteopenia 04/2015 T score -2.4   FRAX not calculated     Social History   Socioeconomic History   Marital status: Married    Spouse name: Not on file   Number of children: 0   Years of education: Not on file   Highest education level: High school graduate  Occupational History   Occupation: Retired  Tobacco Use   Smoking status: Never   Smokeless tobacco: Never  Vaping Use   Vaping status: Never Used   Substance and Sexual Activity   Alcohol use: No    Alcohol/week: 0.0 standard drinks of alcohol   Drug use: No   Sexual activity: Yes  Other Topics Concern   Not on file  Social History Narrative   Married.     Social Drivers of Corporate investment banker Strain: Low Risk  (12/05/2022)   Overall Financial Resource Strain (CARDIA)    Difficulty of Paying Living Expenses: Not hard at all  Food Insecurity: No Food Insecurity (02/21/2024)   Hunger Vital Sign    Worried About Running Out of Food in the Last Year: Never true    Ran Out of Food in the Last Year: Never true  Transportation Needs: No Transportation Needs (02/21/2024)   PRAPARE - Administrator, Civil Service (Medical): No    Lack of Transportation (Non-Medical): No  Physical Activity: Sufficiently Active (12/05/2022)   Exercise Vital Sign    Days of Exercise per Week: 3 days    Minutes of Exercise per Session: 60 min  Stress: No Stress Concern Present (12/05/2022)   [employer] of Occupational Health - Occupational Stress Questionnaire    Feeling of Stress : Not at all  Social Connections: Socially Integrated (02/21/2024)   Social Connection and Isolation Panel    Frequency of Communication with Friends and Family: More than three times a week    Frequency of Social Gatherings with Friends and Family: More than three times a week    Attends Religious Services: More than 4 times per year    Active  Member of Clubs or Organizations: Yes    Attends Engineer, structural: More than 4 times per year    Marital Status: Married   Water engineer and Provision:  Detailed education and instructions provided on heart failure disease management including the following:  Signs and symptoms of Heart Failure When to call the physician Importance of daily weights Low sodium diet Fluid restriction Medication management Anticipated future follow-up appointments  Patient education given on each of the  above topics.  Patient acknowledges understanding via teach back method and acceptance of all instructions.  Education Materials:  Living Better With Heart Failure Booklet, HF zone tool, & Daily Weight Tracker Tool.  Patient has scale at home: yes Patient has pill box at home: yes    High Risk Criteria for Readmission and/or Poor Patient Outcomes: Heart failure hospital admissions (last 6 months): 0  No Show rate: 5%  Difficult social situation: No, lives with her husband Demonstrates medication adherence: Yes Primary Language: english Literacy level: Reading, writing, and comprehension  Barriers of Care:   Diet/ fluid restrictions ( heavy salt user)  Daily weights  Considerations/Referrals:   Referral made to Heart Failure Pharmacist Stewardship: NNA Referral made to Heart Failure CSW/NCM TOC: NA Referral made to Heart & Vascular TOC clinic: Yes, per Dr. Court request. HF [facility] 03/01/2024 @ 9:45 am  Items for Follow-up on DC/TOC: Continued HF education Diet/ fluid restrictions/ daily weights   Stephane Haddock, BSN, RN Heart Failure Teacher, adult education Only

## 2024-02-23 NOTE — TOC Benefit Eligibility Note (Signed)
 Pharmacy Patient Advocate Encounter  Insurance verification completed.    The patient is insured through [employer]. Patient has Medicare and is not eligible for a copay card, but may be able to apply for patient assistance or Medicare RX Payment Plan (Patient Must reach out to their plan, if eligible for payment plan), if available.    Ran test claim for Jardiance 10mg  and the current 30 day co-pay is 158.54.  Ran test claim for Farxiga  10mg  and the current 30 day co-pay is $151.05   This test claim was processed through [facility]- copay amounts may vary at other pharmacies due to [employer], or as the patient moves through the different stages of their insurance plan.

## 2024-02-23 NOTE — Progress Notes (Signed)
 Pt ambulated short distance earlier. Off monitor, ready for d/c. Discussed with pt and husband takotsubo MI, restrictions, exercise, and CRPII. Receptive. Will refer to [facility] CRPII. HF navigator already explained HF and booklet.  [id number] Aliene Aris BS, ACSM-CEP 02/23/2024 11:34 AM

## 2024-02-23 NOTE — Discharge Summary (Addendum)
 Physician Discharge Summary   Patient: Rachael Nguyen MRN: [id number] DOB: 01-Jun-1951  Admit date:     02/21/2024  Discharge date: 02/23/24  Discharge Physician: Rachael Nguyen   PCP: Rachael Norene HERO, DO   Recommendations at discharge:    Pt to be discharged home.   If you experience worsening fever, chills, chest pain, shortness of breath, or other concerning symptoms, please call your PCP or go to the emergency department immediately.  Discharge Diagnoses: Principal Problem:   NSTEMI (non-ST elevated myocardial infarction) (HCC) Active Problems:   Hyperlipidemia   Essential hypertension   Hypokalemia   Non-STEMI (non-ST elevated myocardial infarction) (HCC)  Resolved Problems:   * No resolved hospital problems. *   Hospital Course:  73 y.o. female with history of hypertension, hyperlipidemia who was recently taken off statins due to low calcium  score also recently traveled to [place]  for her wedding anniversary presents to the ER with complaint of chest pain.  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  This was not associated with any shortness of breath diaphoresis palpitations.  Following the talk she reached home when she still had chest pain she tried to take rest and when she woke up she still had the chest pressure and she decided to come to the ER.  Patient also recently was stressed out after her husband was diagnosed with prostate cancer.   ED Course: In the ER CT angiogram was negative for PE.  EKG shows sinus rhythm with LBBB.  Troponins were 516 443 and 1100.  Cardiology was consulted and patient was started on heparin  infusion and admitted for non-ST ovation MI.  Potassium was 3.4.  Assessment and Plan:  NSTEMI - Elevating troponins with chest pressure.  Cardiology follow-up closely.  Cardiac cath noting no CAD, consistent with Takotsubo.  Takotsubo cardiomyopathy/HFrEF -  Noted on cardiac cath.  EF 20-25%.  Initiating on GDMT per cardiology recommendations.  Patient to be discharged home on metoprolol  25 mg daily, losartan  25 mg daily, spironolactone  12.5 mg daily, Farxiga .  Discontinue HCTZ.  Patient to follow-up with cardiology heart failure clinic in 7 to 10 days and cardiology appointment in 4 to 6 weeks.  Hypokalemia - Mildly low on presentation.  Resolved after replenishment.    Consultants: Cardiology Procedures performed: Cardiac catheterization Disposition: Home Diet recommendation:  Discharge Diet Orders (From admission, onward)     Start     Ordered   02/23/24 0000  Diet - low sodium heart healthy        02/23/24 1008           Cardiac diet  DISCHARGE MEDICATION: Allergies as of 02/23/2024       Reactions   Fexofenadine Other (See Comments)   Hydrocodone -acetaminophen    Vicodin [hydrocodone -acetaminophen] Nausea Only   dizziness   Allegra-d [fexofenadine-pseudoephed Er] Nausea Only   Codeine Other (See Comments)   GI upset; dizziness        Medication List     STOP taking these medications    hydrochlorothiazide  12.5 MG capsule Commonly known as: MICROZIDE        TAKE these medications    acetaminophen 650 MG CR tablet Commonly known as: TYLENOL Take 1,300 mg by mouth every 8 (eight) hours as needed for pain.   aspirin  EC 81 MG tablet Take 1 tablet (81 mg total) by mouth daily. Swallow whole.   cholecalciferol 25 MCG (1000  UNIT) tablet Commonly known as: VITAMIN D3 Take 2,000 Units by mouth daily.   famotidine 20 MG tablet Commonly known as: PEPCID Take 20 mg by mouth daily.   Farxiga  10 MG Tabs tablet Generic drug: dapagliflozin  propanediol Take 1 tablet (10 mg total) by mouth daily.   losartan  25 MG tablet Commonly known as: COZAAR  Take 1 tablet (25 mg total) by mouth daily.   metoprolol  succinate 25 MG 24 hr tablet Commonly known as: TOPROL -XL Take 1 tablet (25 mg total) by mouth daily.    nitroGLYCERIN  0.4 MG SL tablet Commonly known as: NITROSTAT  Place 1 tablet (0.4 mg total) under the tongue every 5 (five) minutes as needed for chest pain.   nystatin  powder Commonly known as: nystatin  Apply 1 Application topically 3 (three) times daily. X7-10 days per flareup of rash under breasts What changed:  when to take this reasons to take this additional instructions   pravastatin  80 MG tablet Commonly known as: PRAVACHOL  Take 0.5 tablets (40 mg total) by mouth daily. What changed: how much to take   spironolactone  25 MG tablet Commonly known as: Aldactone  Take 0.5 tablets (12.5 mg total) by mouth daily.   ZINC GUMMY PO Take 11 mg by mouth at bedtime.         Discharge Exam: Filed Weights   02/21/24 1800 02/21/24 2256  Weight: 102.1 kg 100.5 kg    GENERAL:  Alert, pleasant, no acute distress  HEENT:  EOMI CARDIOVASCULAR:  RRR, no murmurs appreciated RESPIRATORY:  Clear to auscultation, no wheezing, rales, or rhonchi GASTROINTESTINAL:  Soft, nontender, nondistended EXTREMITIES:  No LE edema bilaterally NEURO:  No new focal deficits appreciated SKIN:  No rashes noted PSYCH:  Appropriate mood and affect     Condition at discharge: good  The results of significant diagnostics from this hospitalization (including imaging, microbiology, ancillary and laboratory) are listed below for reference.   Imaging Studies: CARDIAC CATHETERIZATION Result Date: 02/22/2024 1.  Normal, right dominant coronary circulation without evidence of spasm, dissection, or bridging. 2.  Ventriculography consistent with Takotsubo cardiomyopathy with LVEDP of 16 mmHg. Recommendation: Goal-directed medical therapy.   ECHOCARDIOGRAM COMPLETE Result Date: 02/22/2024    ECHOCARDIOGRAM REPORT   Patient Name:   Rachael Nguyen Date of Exam: 02/22/2024 Medical Rec #:  [id number]         Height:       63.0 in Accession #:    [id number]        Weight:       221.6 lb Date of Birth:  [birth year]         BSA:          2.020 m Patient Age:    72 years          BP:           100/68 mmHg Patient Gender: F                 HR:           76 bpm. Exam Location:  Inpatient Procedure: 2D Echo, Cardiac Doppler, Color Doppler and Intracardiac            Opacification Agent (Both Spectral and Color Flow Doppler were            utilized during procedure). Indications:    NSTEMI  History:        Patient has prior history of Echocardiogram examinations, most  recent 11/05/2020. Acute MI, Arrythmias:Tachycardia; Risk                 Factors:Hypertension.  Sonographer:    Juliene Rucks Referring Phys: [id number] SUBRINA SUNDIL  Sonographer Comments: Suboptimal parasternal window and suboptimal subcostal window. IMPRESSIONS  1. There is akinesis of the distal 2/3 of the left ventricle. Suspect Tako-tsubo (stress-induced cardiomyopathy) versus wrap-around LAD infarct. Left ventricular ejection fraction, by estimation, is 20 to 25%. The left ventricle has severely decreased function. The left ventricle demonstrates regional wall motion abnormalities (see scoring diagram/findings for description). There is mild concentric left ventricular hypertrophy. Left ventricular diastolic parameters are consistent with Grade I diastolic dysfunction (impaired relaxation).  2. Right ventricular systolic function is normal. The right ventricular size is normal.  3. The mitral valve is normal in structure. Trivial mitral valve regurgitation. No evidence of mitral stenosis.  4. The aortic valve is tricuspid. There is mild calcification of the aortic valve. Aortic valve regurgitation is not visualized. Aortic valve sclerosis/calcification is present, without any evidence of aortic stenosis. Conclusion(s)/Recommendation(s): There is akinesis of the distal 2/3 of the left ventricle. Suspect Tako-tsubo (stress-induced cardiomyopathy) versus wrap-around LAD infarct. FINDINGS  Left Ventricle: There is akinesis of the distal 2/3 of the left  ventricle. Suspect Tako-tsubo (stress-induced cardiomyopathy) versus wrap-around LAD infarct. Left ventricular ejection fraction, by estimation, is 20 to 25%. The left ventricle has severely decreased function. The left ventricle demonstrates regional wall motion abnormalities. The left ventricular internal cavity size was normal in size. There is mild concentric left ventricular hypertrophy. Left ventricular diastolic parameters are consistent with Grade I diastolic dysfunction (impaired relaxation).  LV Wall Scoring: The mid and distal anterior wall, mid and distal lateral wall, mid and distal anterior septum, entire apex, mid and distal inferior wall, mid anterolateral segment, and mid inferoseptal segment are akinetic. Right Ventricle: The right ventricular size is normal. No increase in right ventricular wall thickness. Right ventricular systolic function is normal. Left Atrium: Left atrial size was normal in size. Right Atrium: Right atrial size was normal in size. Pericardium: There is no evidence of pericardial effusion. Mitral Valve: The mitral valve is normal in structure. Trivial mitral valve regurgitation. No evidence of mitral valve stenosis. Tricuspid Valve: The tricuspid valve is normal in structure. Tricuspid valve regurgitation is trivial. No evidence of tricuspid stenosis. Aortic Valve: The aortic valve is tricuspid. There is mild calcification of the aortic valve. Aortic valve regurgitation is not visualized. Aortic valve sclerosis/calcification is present, without any evidence of aortic stenosis. Pulmonic Valve: The pulmonic valve was normal in structure. Pulmonic valve regurgitation is not visualized. No evidence of pulmonic stenosis. Aorta: The aortic root is normal in size and structure. Venous: The inferior vena cava was not well visualized. IAS/Shunts: No atrial level shunt detected by color flow Doppler.  LEFT VENTRICLE PLAX 2D LVIDd:         5.00 cm      Diastology LVIDs:         3.80 cm       LV e' medial:    5.77 cm/s LV PW:         1.00 cm      LV E/e' medial:  10.1 LV IVS:        1.20 cm      LV e' lateral:   5.55 cm/s LVOT diam:     2.00 cm      LV E/e' lateral: 10.5 LV SV:  52 LV SV Index:   26 LVOT Area:     3.14 cm  LV Volumes (MOD) LV vol d, MOD A2C: 119.0 ml LV vol d, MOD A4C: 147.0 ml LV vol s, MOD A2C: 80.2 ml LV vol s, MOD A4C: 96.1 ml LV SV MOD A2C:     38.8 ml LV SV MOD A4C:     147.0 ml LV SV MOD BP:      48.7 ml RIGHT VENTRICLE RV S prime:     13.10 cm/s LEFT ATRIUM             Index        RIGHT ATRIUM          Index LA diam:        3.10 cm 1.53 cm/m   RA Area:     9.45 cm LA Vol (A2C):   73.6 ml 36.44 ml/m  RA Volume:   27.00 ml 13.37 ml/m LA Vol (A4C):   55.7 ml 27.58 ml/m LA Biplane Vol: 65.4 ml 32.38 ml/m  AORTIC VALVE LVOT Vmax:   79.90 cm/s LVOT Vmean:  54.450 cm/s LVOT VTI:    0.164 m  AORTA Ao Root diam: 2.20 cm MITRAL VALVE MV Area (PHT): 3.99 cm    SHUNTS MV Decel Time: 190 msec    Systemic VTI:  0.16 m MV E velocity: 58.20 cm/s  Systemic Diam: 2.00 cm MV A velocity: 59.20 cm/s MV E/A ratio:  0.98 Toribio Fuel MD Electronically signed by Toribio Fuel MD Signature Date/Time: 02/22/2024/2:23:28 PM    Final    CT Angio Chest PE W/Cm &/Or Wo Cm Result Date: 02/21/2024 CLINICAL DATA:  Concern for pulmonary embolism. EXAM: CT ANGIOGRAPHY CHEST WITH CONTRAST TECHNIQUE: Multidetector CT imaging of the chest was performed using the standard protocol during bolus administration of intravenous contrast. Multiplanar CT image reconstructions and MIPs were obtained to evaluate the vascular anatomy. RADIATION DOSE REDUCTION: This exam was performed according to the departmental dose-optimization program which includes automated exposure control, adjustment of the mA and/or kV according to patient size and/or use of iterative reconstruction technique. CONTRAST:  75mL OMNIPAQUE  IOHEXOL  350 MG/ML SOLN COMPARISON:  Chest CT dated 12/05/2023. FINDINGS: Cardiovascular:  There is no cardiomegaly or pericardial effusion. The thoracic aorta is unremarkable. No pulmonary artery embolus identified. Mediastinum/Nodes: No hilar or mediastinal adenopathy. Small hiatal hernia. The esophagus is grossly unremarkable. No mediastinal fluid collection. Lungs/Pleura: No focal consolidation, pleural effusion, pneumothorax. The central airways are patent. Upper Abdomen: No acute abnormality. Musculoskeletal: Degenerative changes of the spine. No acute osseous pathology. Review of the MIP images confirms the above findings. IMPRESSION: 1. No acute intrathoracic pathology. No CT evidence of pulmonary artery embolus. 2. Small hiatal hernia. Electronically Signed   By: Vanetta Chou M.D.   On: 02/21/2024 18:47   DG Chest 2 View Result Date: 02/21/2024 CLINICAL DATA:  Chest pain EXAM: CHEST - 2 VIEW COMPARISON:  Chest radiograph June 15, 2022 FINDINGS: The heart size is borderline normal. Mediastinal contours are within normal limits. Suggestion of bronchial wall thickening. Otherwise both lungs are clear. The visualized skeletal structures are unremarkable. Increased dorsal kyphosis and degenerative changes of the spine. IMPRESSION: Bronchial wall thickening suggestive of medium and large size airway disease. No consolidation. Electronically Signed   By: Megan  Zare M.D.   On: 02/21/2024 17:55    Microbiology: Results for orders placed or performed during the hospital encounter of 02/21/24  Surgical pcr screen     Status: None  Collection Time: 02/21/24 11:03 PM   Specimen: Nasal Mucosa; Nasal Swab  Result Value Ref Range Status   MRSA, PCR NEGATIVE NEGATIVE Final   Staphylococcus aureus NEGATIVE NEGATIVE Final    Comment: (NOTE) The Xpert SA Assay (FDA approved for NASAL specimens in patients 69 years of age and older), is one component of a comprehensive surveillance program. It is not intended to diagnose infection nor to guide or monitor treatment. Performed at [facility], [address], [place], [place] [address]     Labs: CBC: Recent Labs  Lab 02/21/24 1710 02/22/24 0213 02/23/24 0344  WBC 7.8 7.3 6.1  NEUTROABS  --   --  3.2  HGB 13.6 13.0 13.4  HCT 41.4 39.5 40.6  MCV 87.5 86.1 86.9  PLT 263 235 217   Basic Metabolic Panel: Recent Labs  Lab 02/21/24 1710 02/22/24 0213 02/23/24 0344  NA 139 141 139  K 3.4* 3.4* 3.9  CL 104 107 111  CO2 23 22 23   GLUCOSE 101* 113* 107*  BUN 17 12 10   CREATININE 0.80 0.76 0.87  CALCIUM  9.4 8.9 9.0  MG  --   --  1.8   Liver Function Tests: Recent Labs  Lab 02/22/24 0213  AST 24  ALT 23  ALKPHOS 56  BILITOT 1.1  PROT 6.2*  ALBUMIN 3.4*   CBG: No results for input(s): GLUCAP in the last 168 hours.  Discharge time spent: 35 minutes.  Length of inpatient stay: 2 days  Signed: Carliss LELON Canales, DO [facility] 02/23/2024

## 2024-02-23 NOTE — Progress Notes (Signed)
 Progress Note  Patient Name: Rachael Nguyen Date of Encounter: 02/23/2024 [facility] Cardiologist: Lynwood Schilling, MD   Interval Summary    No chest pain or shortness of breath.  Had cardiac cath yesterday showing clean coronary arteries consistent with Takotsubo cardiomyopathy.  Vital Signs Vitals:   02/22/24 1700 02/22/24 1948 02/22/24 2217 02/23/24 0510  BP: 121/80 (!) 124/53 127/65 130/65  Pulse: 75 84 73 64  Resp:  19 18 20   Temp:  98 F (36.7 C) 98 F (36.7 C) 98.2 F (36.8 C)  TempSrc:  Oral Oral Oral  SpO2: 96% 97% 95% 96%  Weight:      Height:        Intake/Output Summary (Last 24 hours) at 02/23/2024 0809 Last data filed at 02/22/2024 1615 Gross per 24 hour  Intake 620 ml  Output --  Net 620 ml      02/21/2024   10:56 PM 02/21/2024    6:00 PM 02/13/2024    2:31 PM  Last 3 Weights  Weight (lbs) 221 lb 9 oz 225 lb 1.4 oz 225 lb  Weight (kg) 100.5 kg 102.1 kg 102.059 kg      Telemetry/ECG   Sinus Rhythm - Personally Reviewed  Cardiac tests:  2D echocardiogram (02/22/2024)  IMPRESSIONS     1. There is akinesis of the distal 2/3 of the left ventricle. Suspect  Tako-tsubo (stress-induced cardiomyopathy) versus wrap-around LAD infarct.  Left ventricular ejection fraction, by estimation, is 20 to 25%. The left  ventricle has severely decreased  function. The left ventricle demonstrates regional wall motion  abnormalities (see scoring diagram/findings for description). There is  mild concentric left ventricular hypertrophy. Left ventricular diastolic  parameters are consistent with Grade I  diastolic dysfunction (impaired relaxation).   2. Right ventricular systolic function is normal. The right ventricular  size is normal.   3. The mitral valve is normal in structure. Trivial mitral valve  regurgitation. No evidence of mitral stenosis.   4. The aortic valve is tricuspid. There is mild calcification of the  aortic valve. Aortic valve  regurgitation is not visualized. Aortic valve  sclerosis/calcification is present, without any evidence of aortic  stenosis.   Cardiac catheterization (02/22/2024)  Conclusion  1.  Normal, right dominant coronary circulation without evidence of spasm, dissection, or bridging. 2.  Ventriculography consistent with Takotsubo cardiomyopathy with LVEDP of 16 mmHg.   Recommendation: Goal-directed medical therap   Physical Exam  GEN: No acute distress.   Neck: No JVD Cardiac: RRR, no murmurs, rubs, or gallops.  Respiratory: Clear to auscultation bilaterally. GI: Soft, nontender, non-distended  MS: No edema  Assessment & Plan   73 year old female with past medical history of hyperlipidemia who presented to [facility] with chest pain and NSTEMI.  NSTEMI -- Developed acute onset of substernal chest pressure yesterday morning that persisted throughout the day. -- High-sensitivity troponin 516>> 443>> 1121>> 836.  EKG shows sinus rhythm with known left bundle branch block -- Reports she has been under significant stress recently with husband diagnosed with prostate cancer and starting treatment. -- Given concern for ACS patient underwent diagnostic coronary angiography yesterday revealing clean coronary arteries consistent with Takotsubo cardiomyopathy.      Hyperlipidemia -- LDL elevated at 121.  She was on pravastatin  in the past which can be continued.  Hypokalemia -- Potassium was decreased initially to 3.4.  She was repleted.  Her current K is 3.9.  LV dysfunction-EF in the 20 to 25% range with wall motion  abnormalities consistent with Takotsubo syndrome.  Will start low-dose GDMT with spironolactone , metoprolol  and losartan .  Will have her see TOC 7 heart failure clinic to titrate in 7 to 10 days and back to see me in 4 to 6 weeks.  Recommend discontinuing her hydrochlorothiazide  as an outpatient and start low-dose spironolactone  12.5 mg.  Can increase metoprolol  XL from  12.5 to 25 mg and losartan  from 12.5 to 25 mg.  For questions or updates, please contact [facility] Please consult [email or web address] for contact info under       Dorn DOROTHA Lesches, M.D., [facility], [place], [facility]  [address] [place], [place]  [address]  [phone] 02/23/2024 8:15 AM

## 2024-02-23 NOTE — Plan of Care (Signed)
  Problem: Education: Goal: Knowledge of General Education information will improve Description: Including pain rating scale, medication(s)/side effects and non-pharmacologic comfort measures Outcome: Progressing   Problem: Health Behavior/Discharge Planning: Goal: Ability to manage health-related needs will improve Outcome: Progressing   Problem: Clinical Measurements: Goal: Ability to maintain clinical measurements within normal limits will improve Outcome: Progressing Goal: Will remain free from infection Outcome: Progressing Goal: Diagnostic test results will improve Outcome: Progressing Goal: Respiratory complications will improve Outcome: Progressing Goal: Cardiovascular complication will be avoided Outcome: Progressing   Problem: Activity: Goal: Risk for activity intolerance will decrease Outcome: Progressing   Problem: Nutrition: Goal: Adequate nutrition will be maintained Outcome: Progressing   Problem: Coping: Goal: Level of anxiety will decrease Outcome: Progressing   Problem: Elimination: Goal: Will not experience complications related to bowel motility Outcome: Progressing Goal: Will not experience complications related to urinary retention Outcome: Progressing   Problem: Pain Managment: Goal: General experience of comfort will improve and/or be controlled Outcome: Progressing   Problem: Safety: Goal: Ability to remain free from injury will improve Outcome: Progressing   Problem: Skin Integrity: Goal: Risk for impaired skin integrity will decrease Outcome: Progressing   Problem: Education: Goal: Understanding of CV disease, CV risk reduction, and recovery process will improve Outcome: Progressing Goal: Individualized Educational Video(s) Outcome: Progressing   Problem: Activity: Goal: Ability to return to baseline activity level will improve Outcome: Progressing   Problem: Cardiovascular: Goal: Ability to achieve and maintain adequate  cardiovascular perfusion will improve Outcome: Progressing Goal: Vascular access site(s) Level 0-1 will be maintained Outcome: Progressing   Problem: Health Behavior/Discharge Planning: Goal: Ability to safely manage health-related needs after discharge will improve Outcome: Progressing   Problem: Education: Goal: Understanding of CV disease, CV risk reduction, and recovery process will improve Outcome: Progressing Goal: Individualized Educational Video(s) Outcome: Progressing   Problem: Activity: Goal: Ability to return to baseline activity level will improve Outcome: Progressing   Problem: Cardiovascular: Goal: Ability to achieve and maintain adequate cardiovascular perfusion will improve Outcome: Progressing Goal: Vascular access site(s) Level 0-1 will be maintained Outcome: Progressing   Problem: Health Behavior/Discharge Planning: Goal: Ability to safely manage health-related needs after discharge will improve Outcome: Progressing

## 2024-02-24 LAB — LIPOPROTEIN A (LPA): Lipoprotein (a): 20.4 nmol/L (ref <75.0)

## 2024-02-26 ENCOUNTER — Telehealth (HOSPITAL_COMMUNITY): Payer: Self-pay

## 2024-02-26 ENCOUNTER — Other Ambulatory Visit (HOSPITAL_COMMUNITY): Payer: Self-pay

## 2024-02-26 ENCOUNTER — Telehealth: Payer: Self-pay | Admitting: Cardiovascular Disease

## 2024-02-26 NOTE — Care Management Important Message (Signed)
 Important Message  Patient Details  Name: Rachael Nguyen MRN: [id number] Date of Birth: 1950-11-23   Important Message Given:  Yes - Medicare IM     [name] 02/26/2024, 8:30 AM

## 2024-02-26 NOTE — Telephone Encounter (Signed)
 Pt c/o BP issue: STAT if pt c/o blurred vision, one-sided weakness or slurred speech.  STAT if BP is GREATER than 180/120 TODAY.  STAT if BP is LESS than 90/60 and SYMPTOMATIC TODAY  1. What is your BP concern?   See below  2. Have you taken any BP medication today?  Yes  3. What are your last 5 BP readings?  84/61 - 8:00 am this morning, after taking medication 119/75 - 11:00 am  today 101/67 - 8/24 107/71 - 8/23  4. Are you having any other symptoms (ex. Dizziness, headache, blurred vision, passed out)?   Almost passed out, lightheaded  Patient stated she had a heart attack last week and today her BP dropped to 84/61.  Patient stated she felt like she was going to pass out after taking her BP medication.

## 2024-02-26 NOTE — Telephone Encounter (Signed)
 Spoke with pt, she is taking all of her medications in the morning. Her blood pressure is running low and she has lost 1 lb daily since discharge, for a total of 3.2 lb. Advised patient to move the losartan  and metoprolol  to the evening and continue the farxiga  and spironolactone  in the morning. She has a follow up in the heart failure clinic Friday this week. Aware will forward to dr berry to review.

## 2024-02-26 NOTE — Telephone Encounter (Signed)
Pt is not interested in the cardiac rehab program. Closed referral 

## 2024-02-27 NOTE — Progress Notes (Signed)
 HEART & VASCULAR TRANSITION OF CARE CONSULT NOTE     Referring Physician: Dr. Arlon PCP: Jolinda Norene HERO, DO  Cardiologist: Lynwood Schilling, MD, switching to Dr. Court soon   Chief Complaint: Takotsubo CM / recent HFrEF  HPI: Referred to clinic by Dr. Arlon for heart failure consultation.   Rachael Nguyen is a 73 y.o. female with recent NSTEMI and HFrEF dx, hx of HTN and HLD.   Admitted 8/25 with NSTEMI. LHC showed no CAD. Ventriculography consistent with Takotsubo CM. Echo showed EF 20-25%, suspected Takotsubo CM with akinesis of distal 2/3 of the LV. GIDD, mild concentric LVH, RV normal, trivial MR. Discharged on GDMT and close f/u in AHF clinic.   Today she presents for AHF [facility] clinic visit with her husband. Overall feeling better. Denies palpitations, CP, dizziness, edema, or PND/Orthopnea. SOB with acitivty. Appetite ok, she watches what she eats. Avoids salt. No fever or chills. Weight at home 215-219 pounds. Taking all medications, except she stopped losartan . She called 911 over the weekend because her BP was in the 80s and she didn't feel well. She also mentions taking 2 doses of nitroglycerin  for transient chest tingling (probably the cause of her BP dropping). By the time EMS had arrived her BP was back up in the 100s.  Denies ETOH, tobacco or drug use. SBP at home low 100s-110s. No family history of heart disease. HTN in her sister and mother.    Past Medical History:  Diagnosis Date   Hyperlipidemia    Osteopenia 04/2015 T score -2.4   FRAX not calculated    Current Outpatient Medications  Medication Sig Dispense Refill   acetaminophen (TYLENOL) 650 MG CR tablet Take 1,300 mg by mouth every 8 (eight) hours as needed for pain.     aspirin  EC 81 MG tablet Take 1 tablet (81 mg total) by mouth daily. Swallow whole. 30 tablet 12   cholecalciferol (VITAMIN D3) 25 MCG (1000 UNIT) tablet Take 2,000 Units by mouth daily.     dapagliflozin  propanediol (FARXIGA )  10 MG TABS tablet Take 1 tablet (10 mg total) by mouth daily. 30 tablet 3   famotidine (PEPCID) 20 MG tablet Take 20 mg by mouth daily.     metoprolol  succinate (TOPROL -XL) 25 MG 24 hr tablet Take 1 tablet (25 mg total) by mouth daily. 30 tablet 0   nitroGLYCERIN  (NITROSTAT ) 0.4 MG SL tablet Place 1 tablet (0.4 mg total) under the tongue every 5 (five) minutes as needed for chest pain. 25 tablet 0   nystatin  powder Apply 1 Application topically 3 (three) times daily. X7-10 days per flareup of rash under breasts 60 g 2   pravastatin  (PRAVACHOL ) 80 MG tablet Take 0.5 tablets (40 mg total) by mouth daily.     spironolactone  (ALDACTONE ) 25 MG tablet Take 0.5 tablets (12.5 mg total) by mouth daily. 30 tablet 0   Zinc Citrate (ZINC GUMMY PO) Take 11 mg by mouth at bedtime.     losartan  (COZAAR ) 25 MG tablet Take 1 tablet (25 mg total) by mouth daily. (Patient not taking: Reported on 03/01/2024) 30 tablet 0   No current facility-administered medications for this encounter.    Allergies  Allergen Reactions   Fexofenadine Other (See Comments)   Hydrocodone -Acetaminophen    Vicodin [Hydrocodone -Acetaminophen] Nausea Only    dizziness   Allegra-D [Fexofenadine-Pseudoephed Er] Nausea Only   Codeine Other (See Comments)    GI upset; dizziness      Social History   Socioeconomic  History   Marital status: Married    Spouse name: John   Number of children: 0   Years of education: Not on file   Highest education level: High school graduate  Occupational History   Occupation: Retired  Tobacco Use   Smoking status: Never   Smokeless tobacco: Never  Vaping Use   Vaping status: Never Used  Substance and Sexual Activity   Alcohol use: No    Alcohol/week: 0.0 standard drinks of alcohol   Drug use: No   Sexual activity: Yes  Other Topics Concern   Not on file  Social History Narrative   Married.     Social Drivers of Corporate investment banker Strain: Low Risk  (12/05/2022)   Overall  Financial Resource Strain (CARDIA)    Difficulty of Paying Living Expenses: Not hard at all  Food Insecurity: No Food Insecurity (02/21/2024)   Hunger Vital Sign    Worried About Running Out of Food in the Last Year: Never true    Ran Out of Food in the Last Year: Never true  Transportation Needs: No Transportation Needs (02/21/2024)   PRAPARE - Administrator, Civil Service (Medical): No    Lack of Transportation (Non-Medical): No  Physical Activity: Sufficiently Active (12/05/2022)   Exercise Vital Sign    Days of Exercise per Week: 3 days    Minutes of Exercise per Session: 60 min  Stress: No Stress Concern Present (12/05/2022)   [employer] of Occupational Health - Occupational Stress Questionnaire    Feeling of Stress : Not at all  Social Connections: Socially Integrated (02/21/2024)   Social Connection and Isolation Panel    Frequency of Communication with Friends and Family: More than three times a week    Frequency of Social Gatherings with Friends and Family: More than three times a week    Attends Religious Services: More than 4 times per year    Active Member of [employer] or Organizations: Yes    Attends Engineer, structural: More than 4 times per year    Marital Status: Married  Catering manager Violence: Not At Risk (02/21/2024)   Humiliation, Afraid, Rape, and Kick questionnaire    Fear of Current or Ex-Partner: No    Emotionally Abused: No    Physically Abused: No    Sexually Abused: No      Family History  Problem Relation Age of Onset   Breast cancer Paternal Aunt    Hypertension Mother    Dementia Mother    Leukemia Father    Colon cancer Neg Hx    Esophageal cancer Neg Hx    Pancreatic cancer Neg Hx    Rectal cancer Neg Hx    Stomach cancer Neg Hx    Ulcerative colitis Neg Hx    Crohn's disease Neg Hx     Vitals:   03/01/24 0930  BP: 126/61  Pulse: 62  SpO2: 99%  Weight: 98.9 kg (218 lb)    PHYSICAL EXAM: General:  well  appearing.  No respiratory difficulty. Walked into clinic.  Neck: JVD flat.  Cor: Regular rate & rhythm. No murmurs. Lungs: clear Extremities: no edema  Neuro: alert & oriented x 3. Affect pleasant.   ECG: NSR, LBBB reviewed from 02/21/24  ASSESSMENT & PLAN: Takotsubo CM, HFrEF - Echo 8/25 EF 20-25%, suspected Takotsubo CM with akinesis of distal 2/3 of the LV. GIDD, mild concentric LVH, RV normal, trivial MR - LHC showed no CAD. Ventriculography consistent with  Takotsubo CM. NYHA I-II.  GDMT:  Diuretic- Does not appear to need additional diuretic at this time.  BB- - Continue toprol  XL 25 mg daily, change to QHS Ace/ARB/ARNI - Will restart Losartan  12.5 mg at bedtime. Suspect hypotensive episode from nitroglycerin . BMET/BNP today. Repeat BMET x1 week.  MRA- Continue spiro 12.5 mg daily SGLT2i - Continue Farxiga  10 mg daily. Pharmacy team to work on grant today.  - Plan for repeat echo in ~3 months once GDMT optimized. If EF persistently low would consider further workup (cMRI +/- RHC) and referral to AHF clinic.   2. HTN - BP stable today - Suspect recent hypotensive episode was from nitroglycerin  and not her BP meds directly. Discussed appropriate indications for nitroglycerin  today.  - Changes as above  3. LBBB - no change, longstanding  4. Anxiety - Suspect chest tingling associated with anxiety - She reports being high anxiety for a long time now - Asked her to follow up with her PCP for anxiolytics.    Referred to HFSW (PCP, Medications, Transportation, ETOH Abuse, Drug Abuse, Insurance, Financial ):  No Refer to Pharmacy:  No Refer to Home Health: No Refer to Advanced Heart Failure Clinic: No  Refer to General Cardiology: Patient of Dr. Loyal. Court  Follow up as scheduled with Gen Cards.

## 2024-02-29 ENCOUNTER — Telehealth: Payer: Self-pay

## 2024-02-29 ENCOUNTER — Telehealth (HOSPITAL_COMMUNITY): Payer: Self-pay

## 2024-02-29 DIAGNOSIS — I10 Essential (primary) hypertension: Secondary | ICD-10-CM

## 2024-02-29 NOTE — Telephone Encounter (Signed)
 Called to confirm/remind patient of their appointment at the Advanced Heart Failure Clinic on 03/01/2024 9:45.   Appointment:   [] Confirmed  [] Left mess   [] No answer/No voice mail  [x] VM Full/unable to leave message  [] Phone not in service  Patient reminded to bring all medications and/or complete list.  Confirmed patient has transportation. Gave directions, instructed to utilize valet parking.

## 2024-03-01 ENCOUNTER — Ambulatory Visit (HOSPITAL_COMMUNITY)
Admit: 2024-03-01 | Discharge: 2024-03-01 | Disposition: A | Discharge status: Home / Self Care | Attending: Internal Medicine | Admitting: Internal Medicine

## 2024-03-01 ENCOUNTER — Encounter (HOSPITAL_COMMUNITY): Payer: Self-pay

## 2024-03-01 ENCOUNTER — Telehealth (HOSPITAL_COMMUNITY): Payer: Self-pay | Admitting: Pharmacy Technician

## 2024-03-01 ENCOUNTER — Ambulatory Visit (HOSPITAL_COMMUNITY): Payer: Self-pay | Admitting: Internal Medicine

## 2024-03-01 ENCOUNTER — Telehealth: Payer: Self-pay

## 2024-03-01 VITALS — BP 126/61 | HR 62 | Wt 218.0 lb

## 2024-03-01 DIAGNOSIS — R0602 Shortness of breath: Secondary | ICD-10-CM | POA: Diagnosis not present

## 2024-03-01 DIAGNOSIS — I10 Essential (primary) hypertension: Secondary | ICD-10-CM | POA: Diagnosis not present

## 2024-03-01 DIAGNOSIS — Z7984 Long term (current) use of oral hypoglycemic drugs: Secondary | ICD-10-CM | POA: Diagnosis not present

## 2024-03-01 DIAGNOSIS — F419 Anxiety disorder, unspecified: Secondary | ICD-10-CM | POA: Diagnosis not present

## 2024-03-01 DIAGNOSIS — I252 Old myocardial infarction: Secondary | ICD-10-CM | POA: Insufficient documentation

## 2024-03-01 DIAGNOSIS — I5181 Takotsubo syndrome: Secondary | ICD-10-CM

## 2024-03-01 DIAGNOSIS — Z79899 Other long term (current) drug therapy: Secondary | ICD-10-CM | POA: Diagnosis not present

## 2024-03-01 DIAGNOSIS — I5022 Chronic systolic (congestive) heart failure: Secondary | ICD-10-CM | POA: Diagnosis not present

## 2024-03-01 DIAGNOSIS — I110 Hypertensive heart disease with heart failure: Secondary | ICD-10-CM | POA: Diagnosis not present

## 2024-03-01 DIAGNOSIS — I447 Left bundle-branch block, unspecified: Secondary | ICD-10-CM | POA: Insufficient documentation

## 2024-03-01 LAB — BASIC METABOLIC PANEL WITH GFR
Anion gap: 11 (ref 5–15)
BUN: 16 mg/dL (ref 8–23)
CO2: 24 mmol/L (ref 22–32)
Calcium: 9.4 mg/dL (ref 8.9–10.3)
Chloride: 104 mmol/L (ref 98–111)
Creatinine, Ser: 0.91 mg/dL (ref 0.44–1.00)
GFR, Estimated: >60 mL/min (ref >60)
Glucose, Bld: 94 mg/dL (ref 70–99)
Potassium: 4.2 mmol/L (ref 3.5–5.1)
Sodium: 139 mmol/L (ref 135–145)

## 2024-03-01 LAB — BRAIN NATRIURETIC PEPTIDE: B Natriuretic Peptide: 311.2 pg/mL — ABNORMAL HIGH (ref 0.0–100.0)

## 2024-03-01 MED ORDER — METOPROLOL SUCCINATE ER 25 MG PO TB24: 25.0000 mg | ORAL_TABLET | Freq: Every day | ORAL | 3 refills | Status: AC

## 2024-03-01 MED ORDER — SPIRONOLACTONE 25 MG PO TABS
12.5000 mg | ORAL_TABLET | Freq: Every day | ORAL | 3 refills | Status: DC
Start: 2024-03-01 — End: 2024-03-25

## 2024-03-01 MED ORDER — FUROSEMIDE 20 MG PO TABS: 20.0000 mg | ORAL_TABLET | Freq: Every day | ORAL | 11 refills | Status: DC

## 2024-03-01 MED ORDER — LOSARTAN POTASSIUM 25 MG PO TABS: 12.5000 mg | ORAL_TABLET | Freq: Every day | ORAL | 3 refills | Status: AC

## 2024-03-01 MED ORDER — DAPAGLIFLOZIN PROPANEDIOL 10 MG PO TABS: 10.0000 mg | ORAL_TABLET | Freq: Every day | ORAL | 3 refills | Status: DC

## 2024-03-01 NOTE — Patient Instructions (Signed)
 Medication Changes:  START LOSARTAN  (12.5MG ) 1/2 TABLET DAILY AT BEDTIME   TAKE METOPROLOL  SUCCINATE AT BEDTIME   Lab Work:  Labs done today, your results will be available in MyChart, we will contact you for abnormal readings.  THEN LABS AGAIN IN 1 WEEK AS SCHEDULED   Follow-Up in: WITH GENERAL CARDIOLOGY AS SCHEDULED   At the Advanced [facility], you and your health needs are our priority. We have a designated team specialized in the treatment of Heart Failure. This Care Team includes your primary Heart Failure Specialized Cardiologist (physician), Advanced Practice Providers (APPs- Physician Assistants and Nurse Practitioners), and Pharmacist who all work together to provide you with the care you need, when you need it.   You may see any of the following providers on your designated Care Team at your next follow up:  Dr. Toribio Fuel Dr. Ezra Shuck Dr. Ria Commander Dr. Odis Brownie Greig Mosses, NP Caffie Shed, [place] [facility] [place], [place] Beckey Coe, NP [place] Lee, NP Tinnie Redman, PharmD   Please be sure to bring in all your medications bottles to every appointment.   Need to Contact Us :  If you have any questions or concerns before your next appointment please send us  a message through [facility] or call our office at [phone].    TO LEAVE A MESSAGE FOR THE NURSE SELECT OPTION 2, PLEASE LEAVE A MESSAGE INCLUDING: YOUR NAME DATE OF BIRTH CALL BACK NUMBER REASON FOR CALL**this is important as we prioritize the call backs  YOU WILL RECEIVE A CALL BACK THE SAME DAY AS LONG AS YOU CALL BEFORE 4:00 PM

## 2024-03-01 NOTE — Progress Notes (Signed)
 Complex Care Management Note  Care Guide Note 03/01/2024 Name: Rachael Nguyen MRN: [id number] DOB: [birth year]  [name] is a 73 y.o. year old female who sees Jolinda Norene HERO, DO for primary care. I reached out to Sharlet Merlynn Salmon by phone today to offer complex care management services.  [name] was given information about Complex Care Management services today including:   The Complex Care Management services include support from the care team which includes your Nurse Care Manager, Clinical Social Worker, or Pharmacist.  The Complex Care Management team is here to help remove barriers to the health concerns and goals most important to you. Complex Care Management services are voluntary, and the patient may decline or stop services at any time by request to their care team member.   Complex Care Management Consent Status: Patient did not agree to participate in complex care management services at this time.  Follow up plan:    Encounter Outcome:  Patient Refused  Jeoffrey Buffalo , RMA     [facility]  [facility], [facility] Guide  Direct Dial: [phone]  Website: [email or web address]

## 2024-03-01 NOTE — Telephone Encounter (Signed)
 Advanced Heart Failure Patient Advocate Encounter  The patient was approved for a [employer] grant that will help cover the cost of Farxiga , Losartan , Metoprolol  and Spironolactone . Total amount awarded, $7,500. Eligibility, 01/31/24 - 01/29/25.  ID [id number]  BIN [id number]  PCN [id number]  Group [id number]  Patient was given a copy while in office.  Almarie JULIANNA Pa, CPhT

## 2024-03-06 ENCOUNTER — Telehealth: Payer: Self-pay | Admitting: Cardiovascular Disease

## 2024-03-06 NOTE — Telephone Encounter (Signed)
  Pt c/o medication issue:  1. Name of Medication:   losartan  (COZAAR ) 25 MG tablet    2. How are you currently taking this medication (dosage and times per day)? Take 0.5 tablets (12.5 mg total) by mouth at bedtime.   3. Are you having a reaction (difficulty breathing--STAT)? No   4. What is your medication issue? Patient said, she tried taking half a tablet of losartan  last Friday and her BP still low and she feels sluggish. She gave recent 5 readings; 97/61 99/71 94/66  102/67 108/67 116/75 106/75  She currently not taking her losartan

## 2024-03-06 NOTE — Telephone Encounter (Signed)
 Patient identification verified by 2 forms.   Called and spoke to patient  Patient states:  -She is not taking Losartan  due to it causing her to be sluggish.  -Pt was seen Friday in heart clinic, Beckey Coe, NP suggested taking 12.5 mg instead of 25 mg. -Pt states she still has sluggish feeling with 12.5 mg. She has not been taking it since   Patient denies:  -Being able to function like normal when taking that medication.              Interventions/Plan: -Will get message to Dr. Court   Reviewed ED warning signs/precautions  Patient agrees with plan, no questions at this time

## 2024-03-08 ENCOUNTER — Ambulatory Visit (HOSPITAL_COMMUNITY)
Admission: RE | Admit: 2024-03-08 | Discharge: 2024-03-08 | Disposition: A | Discharge status: Home / Self Care | Source: Ambulatory Visit | Attending: Cardiology | Admitting: Cardiology

## 2024-03-08 DIAGNOSIS — I5181 Takotsubo syndrome: Secondary | ICD-10-CM | POA: Diagnosis not present

## 2024-03-08 LAB — BASIC METABOLIC PANEL WITH GFR
Anion gap: 8 (ref 5–15)
BUN: 17 mg/dL (ref 8–23)
CO2: 23 mmol/L (ref 22–32)
Calcium: 9.4 mg/dL (ref 8.9–10.3)
Chloride: 108 mmol/L (ref 98–111)
Creatinine, Ser: 0.89 mg/dL (ref 0.44–1.00)
GFR, Estimated: >60 mL/min (ref >60)
Glucose, Bld: 86 mg/dL (ref 70–99)
Potassium: 4.2 mmol/L (ref 3.5–5.1)
Sodium: 139 mmol/L (ref 135–145)

## 2024-03-14 ENCOUNTER — Other Ambulatory Visit: Payer: Self-pay | Admitting: Family Medicine

## 2024-03-14 DIAGNOSIS — Z1231 Encounter for screening mammogram for malignant neoplasm of breast: Secondary | ICD-10-CM

## 2024-03-24 NOTE — Progress Notes (Unsigned)
 Cardiology Office Note    Date:  03/25/2024  ID:  Rachael, Nguyen 1951-03-17, MRN [id number] PCP:  Jolinda Norene HERO, DO  Cardiologist:  Lynwood Schilling, MD  Electrophysiologist:  None   Chief Complaint: Follow up for Takotsubo cardiomyopathy  History of Present Illness: .    Rachael Nguyen is a 73 y.o. female with visit-pertinent history of NSTEMI and heart failure with reduced ejection fraction, hypertension and hyperlipidemia.  Patient was admitted on 02/26/2024 with NSTEMI.  LHC showed no coronary artery disease, ventriculography consistent with Takotsubo cardiomyopathy.  Echocardiogram indicated EF 20 to 25%, suspected Takotsubo CM with akinesis of distal 2/3 of the LV, G1 DD, mild concentric LVH, RV was normal, trivial MR.  Patient was discharged on  Patient was seen in advanced heart failure clinic for [facility] visit on 03/01/2024.  Reported feeling overall better, denied any palpitations, chest pain, dizziness, edema, PND or orthopnea.  She did report some increased shortness of breath with activity.  Patient reported that she had discontinued losartan  over low blood pressures, noted that she had called 911 over the weekend as her blood pressure was in the 80s and she did not feel well, also noted taking 2 doses of nitroglycerin  for transient chest tingling, likely resulting in the decrease in her blood pressure.  Reports that by the time EMS arrived her blood pressure was back up in the 100s.  Patient was restarted on losartan  12.5 mg at bedtime.  Today she presents for follow-up.  She reports that she has been doing well.  She discontinued use of losartan  in setting of hypotension and increased fatigue.  Patient notes that when she was taking all of her medications her systolic blood pressure would drop into the 80s resulting in significant fatigue and lightheadedness.  She reports that this resolved following discontinuation of losartan , on review of her blood pressure log her  systolic blood pressures have been ranging from 105-118, averaging close to 110.  She notes that she has been tolerating this well.  She denies any significant chest pain, pressure or tightness, notes an occasional spasm sensation or tingling in her chest that resolves within a few seconds.  She denies any increased shortness of breath, lower extremity edema, orthopnea or PND.  She reports that she is working on increasing her activity, notes that she was doing some mild work in the yard and has tolerated well.  She denies any palpitations, presyncope or syncope.  She reports that she has not required any Lasix .  Labwork independently reviewed: 03/08/2024: Sodium 139, potassium 4.2, creatinine 0.89 ROS: .   Today she denies chest pain, shortness of breath, lower extremity edema, fatigue, palpitations, melena, hematuria, hemoptysis, diaphoresis, weakness, presyncope, syncope, orthopnea, and PND.  All other systems are reviewed and otherwise negative. Studies Reviewed: SABRA    EKG:  EKG is ordered today, personally reviewed, demonstrating  EKG Interpretation Date/Time:  Monday March 25 2024 08:18:33 EDT Ventricular Rate:  58 PR Interval:  178 QRS Duration:  144 QT Interval:  508 QTC Calculation: 498 R Axis:   -16  Text Interpretation: Sinus bradycardia Left bundle branch block Confirmed by Desma Wilkowski [id number]) on 03/25/2024 8:30:00 AM   CV Studies: Cardiac studies reviewed are outlined and summarized above. Otherwise please see EMR for full report. Cardiac Studies & Procedures   ______________________________________________________________________________________________ CARDIAC CATHETERIZATION  CARDIAC CATHETERIZATION 02/22/2024  Conclusion 1.  Normal, right dominant coronary circulation without evidence of spasm, dissection, or bridging. 2.  Ventriculography consistent with  Takotsubo cardiomyopathy with LVEDP of 16 mmHg.  Recommendation: Goal-directed medical  therapy.  Findings Coronary Findings Diagnostic  Dominance: Right  No diagnostic findings have been documented. Intervention  No interventions have been documented.   STRESS TESTS  MYOCARDIAL PERFUSION IMAGING 12/01/2020  Interpretation Summary  Nuclear stress EF: 74%. The left ventricular ejection fraction is hyperdynamic (>65%).  This is a low risk study. There is no evidence of ischemia and no evidence of previous infarction.  The study is normal.   ECHOCARDIOGRAM  ECHOCARDIOGRAM COMPLETE 02/22/2024  Narrative ECHOCARDIOGRAM REPORT    Patient Name:   Rachael Nguyen Date of Exam: 02/22/2024 Medical Rec #:  [id number]         Height:       63.0 in Accession #:    [id number]        Weight:       221.6 lb Date of Birth:  27-Dec-1950        BSA:          2.020 m Patient Age:    72 years          BP:           100/68 mmHg Patient Gender: F                 HR:           76 bpm. Exam Location:  Inpatient  Procedure: 2D Echo, Cardiac Doppler, Color Doppler and Intracardiac Opacification Agent (Both Spectral and Color Flow Doppler were utilized during procedure).  Indications:    NSTEMI  History:        Patient has prior history of Echocardiogram examinations, most recent 11/05/2020. Acute MI, Arrythmias:Tachycardia; Risk Factors:Hypertension.  Sonographer:    Juliene Rucks Referring Phys: [id number] SUBRINA SUNDIL   Sonographer Comments: Suboptimal parasternal window and suboptimal subcostal window. IMPRESSIONS   1. There is akinesis of the distal 2/3 of the left ventricle. Suspect Tako-tsubo (stress-induced cardiomyopathy) versus wrap-around LAD infarct. Left ventricular ejection fraction, by estimation, is 20 to 25%. The left ventricle has severely decreased function. The left ventricle demonstrates regional wall motion abnormalities (see scoring diagram/findings for description). There is mild concentric left ventricular hypertrophy. Left ventricular diastolic  parameters are consistent with Grade I diastolic dysfunction (impaired relaxation). 2. Right ventricular systolic function is normal. The right ventricular size is normal. 3. The mitral valve is normal in structure. Trivial mitral valve regurgitation. No evidence of mitral stenosis. 4. The aortic valve is tricuspid. There is mild calcification of the aortic valve. Aortic valve regurgitation is not visualized. Aortic valve sclerosis/calcification is present, without any evidence of aortic stenosis.  Conclusion(s)/Recommendation(s): There is akinesis of the distal 2/3 of the left ventricle. Suspect Tako-tsubo (stress-induced cardiomyopathy) versus wrap-around LAD infarct.  FINDINGS Left Ventricle: There is akinesis of the distal 2/3 of the left ventricle. Suspect Tako-tsubo (stress-induced cardiomyopathy) versus wrap-around LAD infarct. Left ventricular ejection fraction, by estimation, is 20 to 25%. The left ventricle has severely decreased function. The left ventricle demonstrates regional wall motion abnormalities. The left ventricular internal cavity size was normal in size. There is mild concentric left ventricular hypertrophy. Left ventricular diastolic parameters are consistent with Grade I diastolic dysfunction (impaired relaxation).   LV Wall Scoring: The mid and distal anterior wall, mid and distal lateral wall, mid and distal anterior septum, entire apex, mid and distal inferior wall, mid anterolateral segment, and mid inferoseptal segment are akinetic.  Right Ventricle: The right ventricular size is normal. No increase in  right ventricular wall thickness. Right ventricular systolic function is normal.  Left Atrium: Left atrial size was normal in size.  Right Atrium: Right atrial size was normal in size.  Pericardium: There is no evidence of pericardial effusion.  Mitral Valve: The mitral valve is normal in structure. Trivial mitral valve regurgitation. No evidence of mitral valve  stenosis.  Tricuspid Valve: The tricuspid valve is normal in structure. Tricuspid valve regurgitation is trivial. No evidence of tricuspid stenosis.  Aortic Valve: The aortic valve is tricuspid. There is mild calcification of the aortic valve. Aortic valve regurgitation is not visualized. Aortic valve sclerosis/calcification is present, without any evidence of aortic stenosis.  Pulmonic Valve: The pulmonic valve was normal in structure. Pulmonic valve regurgitation is not visualized. No evidence of pulmonic stenosis.  Aorta: The aortic root is normal in size and structure.  Venous: The inferior vena cava was not well visualized.  IAS/Shunts: No atrial level shunt detected by color flow Doppler.   LEFT VENTRICLE PLAX 2D LVIDd:         5.00 cm      Diastology LVIDs:         3.80 cm      LV e' medial:    5.77 cm/s LV PW:         1.00 cm      LV E/e' medial:  10.1 LV IVS:        1.20 cm      LV e' lateral:   5.55 cm/s LVOT diam:     2.00 cm      LV E/e' lateral: 10.5 LV SV:         52 LV SV Index:   26 LVOT Area:     3.14 cm  LV Volumes (MOD) LV vol d, MOD A2C: 119.0 ml LV vol d, MOD A4C: 147.0 ml LV vol s, MOD A2C: 80.2 ml LV vol s, MOD A4C: 96.1 ml LV SV MOD A2C:     38.8 ml LV SV MOD A4C:     147.0 ml LV SV MOD BP:      48.7 ml  RIGHT VENTRICLE RV S prime:     13.10 cm/s  LEFT ATRIUM             Index        RIGHT ATRIUM          Index LA diam:        3.10 cm 1.53 cm/m   RA Area:     9.45 cm LA Vol (A2C):   73.6 ml 36.44 ml/m  RA Volume:   27.00 ml 13.37 ml/m LA Vol (A4C):   55.7 ml 27.58 ml/m LA Biplane Vol: 65.4 ml 32.38 ml/m AORTIC VALVE LVOT Vmax:   79.90 cm/s LVOT Vmean:  54.450 cm/s LVOT VTI:    0.164 m  AORTA Ao Root diam: 2.20 cm  MITRAL VALVE MV Area (PHT): 3.99 cm    SHUNTS MV Decel Time: 190 msec    Systemic VTI:  0.16 m MV E velocity: 58.20 cm/s  Systemic Diam: 2.00 cm MV A velocity: 59.20 cm/s MV E/A ratio:  0.98  Toribio Fuel  MD Electronically signed by Toribio Fuel MD Signature Date/Time: 02/22/2024/2:23:28 PM    Final      CT SCANS  CT CARDIAC SCORING (SELF PAY ONLY) 12/05/2023  Addendum 12/26/2023  9:39 PM ADDENDUM REPORT: 12/26/2023 21:36  EXAM: OVER-READ INTERPRETATION  CT CHEST  The following report is an over-read performed by radiologist  Dr. Suzen Dials of [facility], [place] on 12/26/2023. This over-read does not include interpretation of cardiac or coronary anatomy or pathology. The coronary calcium  score/coronary CTA interpretation by the cardiologist is attached.  COMPARISON:  None.  FINDINGS: Cardiovascular: There are no significant extracardiac vascular findings.  Mediastinum/Nodes: There are no enlarged lymph nodes within the visualized mediastinum.  Lungs/Pleura: There is no pleural effusion. The visualized lungs appear clear.  Upper abdomen: There is a small hiatal hernia.  Musculoskeletal/Chest wall: A chronic compression deformity is seen involving a vertebral body within the midthoracic spine.  IMPRESSION: 1. Small hiatal hernia. 2. Chronic compression deformity involving a vertebral body within the midthoracic spine.   Electronically Signed By: Suzen Dials M.D. On: 12/26/2023 21:36  Narrative CLINICAL DATA:  Cardiovascular Disease Risk stratification  EXAM: Coronary Calcium  Score  TECHNIQUE: A gated, non-contrast computed tomography scan of the heart was performed using 3 mm slice thickness. Axial images were analyzed on a dedicated workstation. Calcium  scoring of the coronary arteries was performed using the Agatston method.  FINDINGS: Coronary Calcium  Score:  Total Score: 0  Pericardium: Normal.  Ascending Aorta: Normal caliber.  IMPRESSION: 1. Coronary calcium  score of 0.  2. Non-cardiac: See separate report from [facility].  RECOMMENDATIONS: Coronary artery calcium  (CAC) score is a strong predictor  of incident coronary heart disease (CHD) and provides predictive information beyond traditional risk factors. CAC scoring is reasonable to use in the decision to withhold, postpone, or initiate statin therapy in intermediate-risk or selected borderline-risk asymptomatic adults (age 64-75 years and LDL-C >=70 to <190 mg/dL) who do not have diabetes or established atherosclerotic cardiovascular disease (ASCVD).* In intermediate-risk (10-year ASCVD risk >=7.5% to <20%) adults or selected borderline-risk (10-year ASCVD risk >=5% to <7.5%) adults in whom a CAC score is measured for the purpose of making a treatment decision the following recommendations have been made:  If CAC=0, it is reasonable to withhold statin therapy and reassess in 5 to 10 years, as long as higher risk conditions are absent (diabetes mellitus, family history of premature CHD in first degree relatives (males <55 years; females <65 years), cigarette smoking, or LDL >=190 mg/dL).  If CAC is 1 to 99, it is reasonable to initiate statin therapy for patients >=9 years of age.  If CAC is >=100 or >=75th percentile, it is reasonable to initiate statin therapy at any age.  Cardiology referral should be considered for patients with CAC scores >=400 or >=75th percentile.  *2018 AHA/ACC/AACVPR/AAPA/ABC/ACPM/ADA/AGS/APhA/ASPC/NLA/PCNA Guideline on the Management of Blood Cholesterol: A Report of the American College of Cardiology/American Heart Association Task Force on Clinical Practice Guidelines. J Am Coll Cardiol. 2019;73(24):3168-3209.  Madonna Large, DO, [facility]  Electronically Signed: By: Madonna Large On: 12/07/2023 22:53     ______________________________________________________________________________________________       Current Reported Medications:.    Current Meds  Medication Sig   acetaminophen (TYLENOL) 650 MG CR tablet Take 1,300 mg by mouth every 8 (eight) hours as needed for pain.   aspirin  EC 81  MG tablet Take 1 tablet (81 mg total) by mouth daily. Swallow whole.   cholecalciferol (VITAMIN D3) 25 MCG (1000 UNIT) tablet Take 2,000 Units by mouth daily.   dapagliflozin  propanediol (FARXIGA ) 10 MG TABS tablet Take 1 tablet (10 mg total) by mouth daily.   famotidine (PEPCID) 20 MG tablet Take 20 mg by mouth daily.   furosemide  (LASIX ) 20 MG tablet Take 1 tablet (20 mg total) by mouth daily. (Patient taking differently: Take 20 mg  by mouth daily as needed for fluid or edema.)   metoprolol  succinate (TOPROL -XL) 25 MG 24 hr tablet Take 1 tablet (25 mg total) by mouth at bedtime.   nitroGLYCERIN  (NITROSTAT ) 0.4 MG SL tablet Place 1 tablet (0.4 mg total) under the tongue every 5 (five) minutes as needed for chest pain.   nystatin  powder Apply 1 Application topically 3 (three) times daily. X7-10 days per flareup of rash under breasts   pravastatin  (PRAVACHOL ) 80 MG tablet Take 0.5 tablets (40 mg total) by mouth daily.   Zinc Citrate (ZINC GUMMY PO) Take 11 mg by mouth at bedtime.   [DISCONTINUED] spironolactone  (ALDACTONE ) 25 MG tablet Take 0.5 tablets (12.5 mg total) by mouth daily.    Physical Exam:    VS:  BP 126/80   Pulse (!) 58   Ht 5' 3 (1.6 m)   Wt 211 lb (95.7 kg)   SpO2 98%   BMI 37.38 kg/m    Wt Readings from Last 3 Encounters:  03/25/24 211 lb (95.7 kg)  03/01/24 218 lb (98.9 kg)  02/21/24 221 lb 9 oz (100.5 kg)    GEN: Well nourished, well developed in no acute distress NECK: No JVD; No carotid bruits CARDIAC: RRR, no murmurs, rubs, gallops RESPIRATORY:  Clear to auscultation without rales, wheezing or rhonchi  ABDOMEN: Soft, non-tender, non-distended EXTREMITIES:  No edema; No acute deformity     Asessement and Plan:.    HFrEF/Takotsubo CM: Echo in 02/2024 with a EF 20 to 25%, suspected Takotsubo CM with akinesis of the distal 2/3 of the LV, G1 DD, mild concentric LVH, RV was normal with trivial MR.  LHC showed no CAD.  Ventriculography consistent with Takotsubo CM.  Today she reports that she is doing well, she is unable to tolerate taking losartan  in setting of hypotension and significant fatigue.  She denies any shortness of breath, lower extremity edema, orthopnea or PND.  She reports that she has not required any Lasix .  On discussion with patient we will discontinue spironolactone  and start losartan  12.5 mg daily, she will to monitor for increased lower extremity edema, weight gain of 2 to 3 pounds overnight or 5 pounds in a week.  Reviewed indication for taking of Lasix , patient verbalized understanding. GDMT: Continue losartan  12.5 mg, Farxiga  10 mg daily, Toprol -XL 25 mg daily. She will have BMET at her PCPs office in 2 weeks. Will plan for repeat echocardiogram in 05/2024.  Hypertension: Blood pressure today 128/80.  Patient notes significantly low blood pressures with systolic in the 80s when taking of losartan , Farxiga , metoprolol  and spironolactone .  She reports that she discontinued use of losartan  with significant improvement, systolic blood pressure averaging 110 at home.  Will discontinue spironolactone  as noted above and start on losartan  12.5 mg daily.  Check BMP in 2 weeks.  LBBB: Stable and chronic.  Anxiety: Patient to have annual physical with her PCP in 3 weeks, to discuss at that time.    Disposition: F/u with Garren Greenman, NP in 8 weeks.   Signed, Franchelle Foskett D Derrica Sieg, NP

## 2024-03-25 ENCOUNTER — Ambulatory Visit: Attending: Cardiology | Admitting: Cardiology

## 2024-03-25 ENCOUNTER — Other Ambulatory Visit: Payer: Self-pay

## 2024-03-25 ENCOUNTER — Encounter: Payer: Self-pay | Admitting: Cardiology

## 2024-03-25 VITALS — BP 126/80 | HR 58 | Ht 63.0 in | Wt 211.0 lb

## 2024-03-25 DIAGNOSIS — I5181 Takotsubo syndrome: Secondary | ICD-10-CM

## 2024-03-25 DIAGNOSIS — I10 Essential (primary) hypertension: Secondary | ICD-10-CM

## 2024-03-25 DIAGNOSIS — I447 Left bundle-branch block, unspecified: Secondary | ICD-10-CM

## 2024-03-25 NOTE — Patient Instructions (Addendum)
 Medication Instructions:  Stop Spironolactone  25 mg as directed Lasix  20 mg take 1 tablet as need for swelling, weight gain of 3lb overnight or 5 lb in 1 week.  Restart Losartan  12.5 mg daily  *If you need a refill on your cardiac medications before your next appointment, please call your pharmacy*  Lab Work: BMET in 2 weeks  Testing/Procedures: Your physician has requested that you have an echocardiogram in 2-3 months . Echocardiography is a painless test that uses sound waves to create images of your heart. It provides your doctor with information about the size and shape of your heart and how well your heart's chambers and valves are working. This procedure takes approximately one hour. There are no restrictions for this procedure. Please do NOT wear cologne, perfume, aftershave, or lotions (deodorant is allowed). Please arrive 15 minutes prior to your appointment time.  Please note: We ask at that you not bring children with you during ultrasound (echo/ vascular) testing. Due to room size and safety concerns, children are not allowed in the ultrasound rooms during exams. Our front office staff cannot provide observation of children in our lobby area while testing is being conducted. An adult accompanying a patient to their appointment will only be allowed in the ultrasound room at the discretion of the ultrasound technician under special circumstances. We apologize for any inconvenience.  Follow-Up: At [facility], you and your health needs are our priority.  As part of our continuing mission to provide you with exceptional heart care, our providers are all part of one team.  This team includes your primary Cardiologist (physician) and Advanced Practice Providers or APPs (Physician Assistants and Nurse Practitioners) who all work together to provide you with the care you need, when you need it.  Your next appointment:    1-2 weeks post echo   Provider:   Katlyn West, NP           We recommend signing up for the patient portal called MyChart.  Sign up information is provided on this After Visit Summary.  MyChart is used to connect with patients for Virtual Visits (Telemedicine).  Patients are able to view lab/test results, encounter notes, upcoming appointments, etc.  Non-urgent messages can be sent to your provider as well.   To learn more about what you can do with MyChart, go to [email or web address].

## 2024-03-26 ENCOUNTER — Ambulatory Visit (INDEPENDENT_AMBULATORY_CARE_PROVIDER_SITE_OTHER): Admitting: Family Medicine

## 2024-03-26 ENCOUNTER — Encounter: Payer: Self-pay | Admitting: Family Medicine

## 2024-03-26 VITALS — BP 118/60 | HR 75 | Temp 98.0°F | Ht 63.0 in | Wt 210.5 lb

## 2024-03-26 DIAGNOSIS — F439 Reaction to severe stress, unspecified: Secondary | ICD-10-CM | POA: Diagnosis not present

## 2024-03-26 DIAGNOSIS — F411 Generalized anxiety disorder: Secondary | ICD-10-CM

## 2024-03-26 DIAGNOSIS — L309 Dermatitis, unspecified: Secondary | ICD-10-CM

## 2024-03-26 DIAGNOSIS — I5181 Takotsubo syndrome: Secondary | ICD-10-CM

## 2024-03-26 MED ORDER — BUSPIRONE HCL 7.5 MG PO TABS: 3.7500 mg | ORAL_TABLET | Freq: Two times a day (BID) | ORAL | 0 refills | Status: AC

## 2024-03-26 MED ORDER — TRIAMCINOLONE ACETONIDE 0.1 % EX CREA: 1.0000 | TOPICAL_CREAM | Freq: Two times a day (BID) | CUTANEOUS | 0 refills | Status: AC

## 2024-03-26 NOTE — Progress Notes (Signed)
 Subjective: CC: Follow-up anxiety PCP: Rachael Norene HERO, DO YEP:Rachael Nguyen is a 73 y.o. female presenting to clinic today for:  Anxiety, stress Since her last visit patient actually sustained a heart attack that was attributed to Takotsubo's cardiomyopathy.  She had a totally clean cath.  But because the LDL had gone up so much she was advised to restart her pravastatin  which she had discontinued about 1 week prior to her attack.  She voiced reluctance to use atorvastatin  because she has done well with pravastatin  in the past.  She reports no ongoing chest pain.  She continues to follow with cardiology as directed and is compliant with all of her heart medications.  Denies any vaginitis with the Farxiga .  All meds are affordable at this time.  She does report ongoing stress but she has been trying to reduce stress by being physically active and trying to avoid situations that cause increased stress for her.  She typically worries about every body that she loves and even persons at her church but she is trying to put a little distance between herself in some cases because she understands it aggravates her stress levels.  She was previously treated with Pristiq  but could not tolerate so wants to go on something that is more as needed.  Her husband definitely recommends that her going on to something because he worries about her too  She also reports that she has a rash on her left forehead that has been present for a few days.  Is quite itchy and she in fact thought it was a bug bite initially but now it seems to be getting a little bit more rash-like.  No known exposures.  She has been applying some Neosporin to it but no other products   ROS: Per HPI  Allergies  Allergen Reactions   Fexofenadine Other (See Comments)   Hydrocodone -Acetaminophen    Vicodin [Hydrocodone -Acetaminophen] Nausea Only    dizziness   Allegra-D [Fexofenadine-Pseudoephed Er] Nausea Only   Codeine Other (See  Comments)    GI upset; dizziness   Past Medical History:  Diagnosis Date   Hyperlipidemia    Myocardial infarction (HCC) 02/21/2024   Osteopenia 04/2015 T score -2.4   FRAX not calculated    Current Outpatient Medications:    acetaminophen (TYLENOL) 650 MG CR tablet, Take 1,300 mg by mouth every 8 (eight) hours as needed for pain., Disp: , Rfl:    aspirin  EC 81 MG tablet, Take 1 tablet (81 mg total) by mouth daily. Swallow whole., Disp: 30 tablet, Rfl: 12   busPIRone  (BUSPAR ) 7.5 MG tablet, Take 0.5-1 tablets (3.75-7.5 mg total) by mouth 2 (two) times daily. For anxiety, Disp: 60 tablet, Rfl: 0   cholecalciferol (VITAMIN D3) 25 MCG (1000 UNIT) tablet, Take 2,000 Units by mouth daily., Disp: , Rfl:    dapagliflozin  propanediol (FARXIGA ) 10 MG TABS tablet, Take 1 tablet (10 mg total) by mouth daily., Disp: 30 tablet, Rfl: 3   famotidine (PEPCID) 20 MG tablet, Take 20 mg by mouth daily., Disp: , Rfl:    losartan  (COZAAR ) 25 MG tablet, Take 0.5 tablets (12.5 mg total) by mouth at bedtime., Disp: 45 tablet, Rfl: 3   metoprolol  succinate (TOPROL -XL) 25 MG 24 hr tablet, Take 1 tablet (25 mg total) by mouth at bedtime., Disp: 90 tablet, Rfl: 3   nitroGLYCERIN  (NITROSTAT ) 0.4 MG SL tablet, Place 1 tablet (0.4 mg total) under the tongue every 5 (five) minutes as needed for chest pain., Disp:  25 tablet, Rfl: 0   nystatin  powder, Apply 1 Application topically 3 (three) times daily. X7-10 days per flareup of rash under breasts, Disp: 60 g, Rfl: 2   pravastatin  (PRAVACHOL ) 80 MG tablet, Take 0.5 tablets (40 mg total) by mouth daily., Disp: , Rfl:    triamcinolone  cream (KENALOG ) 0.1 %, Apply 1 Application topically 2 (two) times daily. X7 days as needed for itchy rash, Disp: 30 g, Rfl: 0   Zinc Citrate (ZINC GUMMY PO), Take 11 mg by mouth at bedtime., Disp: , Rfl:    furosemide  (LASIX ) 20 MG tablet, Take 1 tablet (20 mg total) by mouth daily. (Patient not taking: Reported on 03/26/2024), Disp: 30 tablet,  Rfl: 11 Social History   Socioeconomic History   Marital status: Married    Spouse name: John   Number of children: 0   Years of education: Not on file   Highest education level: High school graduate  Occupational History   Occupation: Retired  Tobacco Use   Smoking status: Never   Smokeless tobacco: Never  Vaping Use   Vaping status: Never Used  Substance and Sexual Activity   Alcohol use: No    Alcohol/week: 0.0 standard drinks of alcohol   Drug use: No   Sexual activity: Yes  Other Topics Concern   Not on file  Social History Narrative   Married.     Social Drivers of Corporate investment banker Strain: Low Risk  (12/05/2022)   Overall Financial Resource Strain (CARDIA)    Difficulty of Paying Living Expenses: Not hard at all  Food Insecurity: No Food Insecurity (02/21/2024)   Hunger Vital Sign    Worried About Running Out of Food in the Last Year: Never true    Ran Out of Food in the Last Year: Never true  Transportation Needs: No Transportation Needs (02/21/2024)   PRAPARE - Administrator, Civil Service (Medical): No    Lack of Transportation (Non-Medical): No  Physical Activity: Sufficiently Active (12/05/2022)   Exercise Vital Sign    Days of Exercise per Week: 3 days    Minutes of Exercise per Session: 60 min  Stress: No Stress Concern Present (12/05/2022)   [employer] of Occupational Health - Occupational Stress Questionnaire    Feeling of Stress : Not at all  Social Connections: Socially Integrated (02/21/2024)   Social Connection and Isolation Panel    Frequency of Communication with Friends and Family: More than three times a week    Frequency of Social Gatherings with Friends and Family: More than three times a week    Attends Religious Services: More than 4 times per year    Active Member of [employer] or Organizations: Yes    Attends Engineer, structural: More than 4 times per year    Marital Status: Married  Catering manager  Violence: Not At Risk (02/21/2024)   Humiliation, Afraid, Rape, and Kick questionnaire    Fear of Current or Ex-Partner: No    Emotionally Abused: No    Physically Abused: No    Sexually Abused: No   Family History  Problem Relation Age of Onset   Breast cancer Paternal Aunt    Hypertension Mother    Dementia Mother    Leukemia Father    Colon cancer Neg Hx    Esophageal cancer Neg Hx    Pancreatic cancer Neg Hx    Rectal cancer Neg Hx    Stomach cancer Neg Hx    Ulcerative  colitis Neg Hx    Crohn's disease Neg Hx     Objective: Office vital signs reviewed. BP 118/60   Pulse 75   Temp 98 F (36.7 C)   Ht 5' 3 (1.6 m)   Wt 210 lb 8 oz (95.5 kg)   SpO2 93%   BMI 37.29 kg/m   Physical Examination:  General: Awake, alert, well nourished, No acute distress Skin: Minimally erythematous maculopapular rash with some hive formation noted along the left forehead.  No induration.  No fluctuance Psych: Mood stable, speech normal, affect appropriate.  Very pleasant and interactive     03/26/2024   10:56 AM 02/13/2024    3:24 PM 10/16/2023    8:25 AM  Depression screen PHQ 2/9  Decreased Interest 0 0 0  Down, Depressed, Hopeless 0 0 0  PHQ - 2 Score 0 0 0  Altered sleeping 0 0   Tired, decreased energy 0 0   Change in appetite 0 0   Feeling bad or failure about yourself  0 0   Trouble concentrating 0 0   Moving slowly or fidgety/restless 0 0   Suicidal thoughts 0 0   PHQ-9 Score 0 0   Difficult doing work/chores Not difficult at all Not difficult at all       03/26/2024   10:56 AM 02/13/2024    3:24 PM 04/17/2023    9:09 AM 04/11/2022    8:18 AM  GAD 7 : Generalized Anxiety Score  Nervous, Anxious, on Edge 0 0 0 0  Control/stop worrying 0 0 0 0  Worry too much - different things 0 0 0 0  Trouble relaxing 0 0 0 0  Restless 0 0 0 0  Easily annoyed or irritable 0 0 0 0  Afraid - awful might happen 0 0 0 0  Total GAD 7 Score 0 0 0 0  Anxiety Difficulty Not difficult  at all Not difficult at all Not difficult at all Not difficult at all   Assessment/ Plan: 73 y.o. female   Dermatitis of face - Plan: triamcinolone  cream (KENALOG ) 0.1 %  Stress - Plan: busPIRone  (BUSPAR ) 7.5 MG tablet  Generalized anxiety disorder - Plan: busPIRone  (BUSPAR ) 7.5 MG tablet  Takotsubo cardiomyopathy - Plan: busPIRone  (BUSPAR ) 7.5 MG tablet   I have prescribed short course of triamcinolone  to apply to the affected area on the forehead.  For her stress, we will start with half to 1 tablet of BuSpar  7.5 mg up to twice daily.  We discussed continuing all the medications as prescribed by her cardiologist including the statin even though her cholesterol had previously been controlled and even though her heart cath was clean.  We discussed that this is standard of care for any type of heart attack to reduce cardiovascular risk and she seemed amenable to continuing statin long-term given recent events  We have an appointment in 3 weeks for her general checkup and we will reassess her anxiety level at that time and adjust medications as needed  Jariya Reichow CHRISTELLA Fielding, DO [facility] [phone]

## 2024-03-26 NOTE — Telephone Encounter (Signed)
Pt seen in office yesterday.

## 2024-04-08 ENCOUNTER — Other Ambulatory Visit

## 2024-04-08 DIAGNOSIS — D225 Melanocytic nevi of trunk: Secondary | ICD-10-CM | POA: Diagnosis not present

## 2024-04-08 DIAGNOSIS — Z1283 Encounter for screening for malignant neoplasm of skin: Secondary | ICD-10-CM | POA: Diagnosis not present

## 2024-04-08 DIAGNOSIS — L570 Actinic keratosis: Secondary | ICD-10-CM | POA: Diagnosis not present

## 2024-04-08 DIAGNOSIS — I447 Left bundle-branch block, unspecified: Secondary | ICD-10-CM | POA: Diagnosis not present

## 2024-04-08 DIAGNOSIS — L821 Other seborrheic keratosis: Secondary | ICD-10-CM | POA: Diagnosis not present

## 2024-04-08 DIAGNOSIS — X32XXXD Exposure to sunlight, subsequent encounter: Secondary | ICD-10-CM | POA: Diagnosis not present

## 2024-04-08 DIAGNOSIS — I10 Essential (primary) hypertension: Secondary | ICD-10-CM | POA: Diagnosis not present

## 2024-04-08 DIAGNOSIS — I5181 Takotsubo syndrome: Secondary | ICD-10-CM | POA: Diagnosis not present

## 2024-04-09 ENCOUNTER — Ambulatory Visit: Payer: Self-pay | Admitting: Cardiology

## 2024-04-09 LAB — BASIC METABOLIC PANEL WITH GFR
BUN/Creatinine Ratio: 15 (ref 12–28)
BUN: 12 mg/dL (ref 8–27)
CO2: 20 mmol/L (ref 20–29)
Calcium: 9.4 mg/dL (ref 8.7–10.3)
Chloride: 105 mmol/L (ref 96–106)
Creatinine, Ser: 0.78 mg/dL (ref 0.57–1.00)
Glucose: 83 mg/dL (ref 70–99)
Potassium: 3.8 mmol/L (ref 3.5–5.2)
Sodium: 140 mmol/L (ref 134–144)
eGFR: 82 mL/min/1.73 (ref >59)

## 2024-04-09 NOTE — Progress Notes (Signed)
 Pt has been made aware of normal result and verbalized understanding.  jw

## 2024-04-11 ENCOUNTER — Ambulatory Visit
Admission: RE | Admit: 2024-04-11 | Discharge: 2024-04-11 | Disposition: A | Source: Ambulatory Visit | Attending: Family Medicine

## 2024-04-11 DIAGNOSIS — Z1231 Encounter for screening mammogram for malignant neoplasm of breast: Secondary | ICD-10-CM

## 2024-04-12 NOTE — Patient Instructions (Addendum)
 Colonoscopy due 02/2025.

## 2024-04-16 ENCOUNTER — Ambulatory Visit

## 2024-04-16 ENCOUNTER — Encounter: Payer: Self-pay | Admitting: Family Medicine

## 2024-04-16 ENCOUNTER — Ambulatory Visit: Admitting: Family Medicine

## 2024-04-16 VITALS — BP 114/65 | HR 57 | Temp 97.0°F | Ht 63.0 in | Wt 206.2 lb

## 2024-04-16 DIAGNOSIS — Z0000 Encounter for general adult medical examination without abnormal findings: Secondary | ICD-10-CM

## 2024-04-16 DIAGNOSIS — I5181 Takotsubo syndrome: Secondary | ICD-10-CM

## 2024-04-16 DIAGNOSIS — I10 Essential (primary) hypertension: Secondary | ICD-10-CM | POA: Diagnosis not present

## 2024-04-16 DIAGNOSIS — E782 Mixed hyperlipidemia: Secondary | ICD-10-CM

## 2024-04-16 DIAGNOSIS — Z0001 Encounter for general adult medical examination with abnormal findings: Secondary | ICD-10-CM | POA: Diagnosis not present

## 2024-04-16 DIAGNOSIS — B3731 Acute candidiasis of vulva and vagina: Secondary | ICD-10-CM

## 2024-04-16 LAB — CMP14+EGFR
ALT: 17 IU/L (ref 0–32)
AST: 18 IU/L (ref 0–40)
Albumin: 4 g/dL (ref 3.9–4.9)
Alkaline Phosphatase: 85 IU/L (ref 49–135)
BUN/Creatinine Ratio: 16 (ref 12–28)
BUN: 13 mg/dL (ref 8–27)
Bilirubin Total: 0.7 mg/dL (ref 0.0–1.2)
CO2: 22 mmol/L (ref 20–29)
Calcium: 9.6 mg/dL (ref 8.7–10.3)
Chloride: 104 mmol/L (ref 96–106)
Creatinine, Ser: 0.79 mg/dL (ref 0.57–1.00)
Globulin, Total: 2.4 g/dL (ref 1.5–4.5)
Glucose: 88 mg/dL (ref 70–99)
Potassium: 4.1 mmol/L (ref 3.5–5.2)
Sodium: 140 mmol/L (ref 134–144)
Total Protein: 6.4 g/dL (ref 6.0–8.5)
eGFR: 80 mL/min/1.73 (ref >59)

## 2024-04-16 LAB — LIPID PANEL
Chol/HDL Ratio: 3.5 ratio (ref 0.0–4.4)
Cholesterol, Total: 165 mg/dL (ref 100–199)
HDL: 47 mg/dL (ref >39)
LDL Chol Calc (NIH): 100 mg/dL — ABNORMAL HIGH (ref 0–99)
Triglycerides: 99 mg/dL (ref 0–149)
VLDL Cholesterol Cal: 18 mg/dL (ref 5–40)

## 2024-04-16 LAB — BAYER DCA HB A1C WAIVED: HB A1C (BAYER DCA - WAIVED): 5.3 % (ref 4.8–5.6)

## 2024-04-16 MED ORDER — FLUCONAZOLE 150 MG PO TABS: 150.0000 mg | ORAL_TABLET | Freq: Once | ORAL | 0 refills | Status: DC

## 2024-04-16 NOTE — Progress Notes (Signed)
 Rachael Nguyen is a 73 y.o. female presents to office today for annual physical exam examination.    She reports that she is doing well.  She has been having some vaginal irritation and she thinks this is related to the Farxiga .  Does not report any odors or dysuria.  Thinks she has a yeast infection.  No treatments used thus far.  She reports no recurrent chest pains.  She is really trying to keep her diet and exercise regimen but admits that she has not really lost any weight and this is somewhat deflating  She reports resolution of the rash on her face and she is actually seeing dermatology since her last visit.  She sees Dr. Shona in [place].  No concerning findings on his exam.  She had to reschedule her eye exam with Dr. Dewitt office from May as it conflicted with today's visit.  She reports that she has not needed any of the buspirone  that was prescribed last visit because her mood has been under better control and her stress level has been less.  Her husband had an evaluation yesterday and he is going to be starting 28 rounds of radiation soon.  She denies any family history changes in the last year.   Occupation: Retired, Marital status: Married, Substance use: None Health Maintenance Due  Topic Date Due   [employer]ness (AWV)  12/05/2023   COVID-19 Vaccine (4 - 2025-26 season) 03/04/2024    Immunization History  Administered Date(s) Administered   Moderna Sars-Covid-2 Vaccination 08/08/2019, 09/09/2019, 05/13/2020   Tdap 05/17/2011, 04/13/2023   Past Medical History:  Diagnosis Date   Hyperlipidemia    Myocardial infarction (HCC) 02/21/2024   Osteopenia 04/2015 T score -2.4   FRAX not calculated   Social History   Socioeconomic History   Marital status: Married    Spouse name: John   Number of children: 0   Years of education: Not on file   Highest education level: High school graduate  Occupational History   Occupation: Retired  Tobacco Use    Smoking status: Never   Smokeless tobacco: Never  Vaping Use   Vaping status: Never Used  Substance and Sexual Activity   Alcohol use: No    Alcohol/week: 0.0 standard drinks of alcohol   Drug use: No   Sexual activity: Yes  Other Topics Concern   Not on file  Social History Narrative   Married.     Social Drivers of Corporate investment banker Strain: Low Risk  (12/05/2022)   Overall Financial Resource Strain (CARDIA)    Difficulty of Paying Living Expenses: Not hard at all  Food Insecurity: No Food Insecurity (02/21/2024)   Hunger Vital Sign    Worried About Running Out of Food in the Last Year: Never true    Ran Out of Food in the Last Year: Never true  Transportation Needs: No Transportation Needs (02/21/2024)   PRAPARE - Administrator, Civil Service (Medical): No    Lack of Transportation (Non-Medical): No  Physical Activity: Sufficiently Active (12/05/2022)   Exercise Vital Sign    Days of Exercise per Week: 3 days    Minutes of Exercise per Session: 60 min  Stress: No Stress Concern Present (12/05/2022)   [employer] of Occupational Health - Occupational Stress Questionnaire    Feeling of Stress : Not at all  Social Connections: Socially Integrated (02/21/2024)   Social Connection and Isolation Panel    Frequency of Communication  with Friends and Family: More than three times a week    Frequency of Social Gatherings with Friends and Family: More than three times a week    Attends Religious Services: More than 4 times per year    Active Member of [employer] or Organizations: Yes    Attends Banker Meetings: More than 4 times per year    Marital Status: Married  Catering manager Violence: Not At Risk (02/21/2024)   Humiliation, Afraid, Rape, and Kick questionnaire    Fear of Current or Ex-Partner: No    Emotionally Abused: No    Physically Abused: No    Sexually Abused: No   Past Surgical History:  Procedure Laterality Date   COMBINED  HYSTEROSCOPY DIAGNOSTIC / D&C  2011   polyp   DENTAL SURGERY     3 teeth extracted   LEFT HEART CATH AND CORONARY ANGIOGRAPHY N/A 02/22/2024   Procedure: LEFT HEART CATH AND CORONARY ANGIOGRAPHY;  Surgeon: Wendel Lurena POUR, MD;  Location: [facility];  Service: Cardiovascular;  Laterality: N/A;   Family History  Problem Relation Age of Onset   Breast cancer Paternal Aunt    Hypertension Mother    Dementia Mother    Leukemia Father    Colon cancer Neg Hx    Esophageal cancer Neg Hx    Pancreatic cancer Neg Hx    Rectal cancer Neg Hx    Stomach cancer Neg Hx    Ulcerative colitis Neg Hx    Crohn's disease Neg Hx     Current Outpatient Medications:    acetaminophen (TYLENOL) 650 MG CR tablet, Take 1,300 mg by mouth every 8 (eight) hours as needed for pain., Disp: , Rfl:    aspirin  EC 81 MG tablet, Take 1 tablet (81 mg total) by mouth daily. Swallow whole., Disp: 30 tablet, Rfl: 12   busPIRone  (BUSPAR ) 7.5 MG tablet, Take 0.5-1 tablets (3.75-7.5 mg total) by mouth 2 (two) times daily. For anxiety, Disp: 60 tablet, Rfl: 0   cholecalciferol (VITAMIN D3) 25 MCG (1000 UNIT) tablet, Take 2,000 Units by mouth daily., Disp: , Rfl:    dapagliflozin  propanediol (FARXIGA ) 10 MG TABS tablet, Take 1 tablet (10 mg total) by mouth daily., Disp: 30 tablet, Rfl: 3   famotidine (PEPCID) 20 MG tablet, Take 20 mg by mouth daily., Disp: , Rfl:    furosemide  (LASIX ) 20 MG tablet, Take 1 tablet (20 mg total) by mouth daily. (Patient not taking: Reported on 03/26/2024), Disp: 30 tablet, Rfl: 11   losartan  (COZAAR ) 25 MG tablet, Take 0.5 tablets (12.5 mg total) by mouth at bedtime., Disp: 45 tablet, Rfl: 3   metoprolol  succinate (TOPROL -XL) 25 MG 24 hr tablet, Take 1 tablet (25 mg total) by mouth at bedtime., Disp: 90 tablet, Rfl: 3   nitroGLYCERIN  (NITROSTAT ) 0.4 MG SL tablet, Place 1 tablet (0.4 mg total) under the tongue every 5 (five) minutes as needed for chest pain., Disp: 25 tablet, Rfl: 0    nystatin  powder, Apply 1 Application topically 3 (three) times daily. X7-10 days per flareup of rash under breasts, Disp: 60 g, Rfl: 2   pravastatin  (PRAVACHOL ) 80 MG tablet, Take 0.5 tablets (40 mg total) by mouth daily., Disp: , Rfl:    triamcinolone  cream (KENALOG ) 0.1 %, Apply 1 Application topically 2 (two) times daily. X7 days as needed for itchy rash, Disp: 30 g, Rfl: 0   Zinc Citrate (ZINC GUMMY PO), Take 11 mg by mouth at bedtime., Disp: , Rfl:  Allergies  Allergen Reactions   Fexofenadine Other (See Comments)   Hydrocodone -Acetaminophen    Vicodin [Hydrocodone -Acetaminophen] Nausea Only    dizziness   Allegra-D [Fexofenadine-Pseudoephed Er] Nausea Only   Codeine Other (See Comments)    GI upset; dizziness     ROS: Review of Systems Pertinent items noted in HPI and remainder of comprehensive ROS otherwise negative.    Physical exam BP 114/65   Pulse (!) 57   Temp (!) 97 F (36.1 C)   Ht 5' 3 (1.6 m)   Wt 206 lb 3.2 oz (93.5 kg)   SpO2 98%   BMI 36.53 kg/m  General appearance: alert, cooperative, appears stated age, no distress, and morbidly obese Head: Normocephalic, without obvious abnormality, atraumatic Eyes: negative findings: lids and lashes normal, conjunctivae and sclerae normal, corneas clear, and pupils equal, round, reactive to light and accomodation Ears: normal TM's and external ear canals both ears Nose: Nares normal. Septum midline. Mucosa normal. No drainage or sinus tenderness. Throat: lips, mucosa, and tongue normal; teeth and gums normal Neck: no adenopathy, no carotid bruit, supple, symmetrical, trachea midline, and thyroid  not enlarged, symmetric, no tenderness/mass/nodules Back: Increased thoracic kyphosis.  Somewhat reduced extension of the thoracic spine Lungs: clear to auscultation bilaterally Heart: regular rate and rhythm, S1, S2 normal, no murmur, click, rub or gallop Abdomen: Obese, soft, nontender, nondistended. Extremities: extremities  normal, atraumatic, no cyanosis or edema Pulses: 2+ and symmetric Skin: Skin color, texture, turgor normal. No rashes or lesions Lymph nodes: Cervical, supraclavicular, and axillary nodes normal. Neurologic: Grossly normal      04/16/2024    8:21 AM 03/26/2024   10:56 AM 02/13/2024    3:24 PM  Depression screen PHQ 2/9  Decreased Interest 0 0 0  Down, Depressed, Hopeless 0 0 0  PHQ - 2 Score 0 0 0  Altered sleeping  0 0  Tired, decreased energy  0 0  Change in appetite  0 0  Feeling bad or failure about yourself   0 0  Trouble concentrating  0 0  Moving slowly or fidgety/restless  0 0  Suicidal thoughts  0 0  PHQ-9 Score  0 0  Difficult doing work/chores Not difficult at all Not difficult at all Not difficult at all      03/26/2024   10:56 AM 02/13/2024    3:24 PM 04/17/2023    9:09 AM 04/11/2022    8:18 AM  GAD 7 : Generalized Anxiety Score  Nervous, Anxious, on Edge 0 0 0 0  Control/stop worrying 0 0 0 0  Worry too much - different things 0 0 0 0  Trouble relaxing 0 0 0 0  Restless 0 0 0 0  Easily annoyed or irritable 0 0 0 0  Afraid - awful might happen 0 0 0 0  Total GAD 7 Score 0 0 0 0  Anxiety Difficulty Not difficult at all Not difficult at all Not difficult at all Not difficult at all    Recent Results (from the past 2160 hours)  Urinalysis, Routine w reflex microscopic     Status: Abnormal   Collection Time: 02/13/24  3:02 PM  Result Value Ref Range   Specific Gravity, UA 1.020 1.005 - 1.030   pH, UA 7.0 5.0 - 7.5   Color, UA Yellow Yellow   Appearance Ur Clear Clear   Leukocytes,UA Trace (A) Negative   Protein,UA Negative Negative/Trace   Glucose, UA Negative Negative   Ketones, UA Negative Negative   RBC,  UA Negative Negative   Bilirubin, UA Negative Negative   Urobilinogen, Ur 4.0 (H) 0.2 - 1.0 mg/dL   Nitrite, UA Negative Negative   Microscopic Examination See below:   Microscopic Examination     Status: None   Collection Time: 02/13/24  3:02 PM    Urine  Result Value Ref Range   WBC, UA 0-5 0 - 5 /hpf   RBC, Urine 0-2 0 - 2 /hpf   Epithelial Cells (non renal) 0-10 0 - 10 /hpf   Bacteria, UA None seen None seen/Few  Basic metabolic panel     Status: Abnormal   Collection Time: 02/21/24  5:10 PM  Result Value Ref Range   Sodium 139 135 - 145 mmol/L   Potassium 3.4 (L) 3.5 - 5.1 mmol/L   Chloride 104 98 - 111 mmol/L   CO2 23 22 - 32 mmol/L   Glucose, Bld 101 (H) 70 - 99 mg/dL    Comment: Glucose reference range applies only to samples taken after fasting for at least 8 hours.   BUN 17 8 - 23 mg/dL   Creatinine, Ser 9.19 0.44 - 1.00 mg/dL   Calcium  9.4 8.9 - 10.3 mg/dL   GFR, Estimated >39 >39 mL/min    Comment: (NOTE) Calculated using the CKD-EPI Creatinine Equation (2021)    Anion gap 13 5 - 15    Comment: Performed at [facility], [address], [place], [place] [address]  CBC     Status: None   Collection Time: 02/21/24  5:10 PM  Result Value Ref Range   WBC 7.8 4.0 - 10.5 K/uL   RBC 4.73 3.87 - 5.11 MIL/uL   Hemoglobin 13.6 12.0 - 15.0 g/dL   HCT 58.5 63.9 - 53.9 %   MCV 87.5 80.0 - 100.0 fL   MCH 28.8 26.0 - 34.0 pg   MCHC 32.9 30.0 - 36.0 g/dL   RDW 85.3 88.4 - 84.4 %   Platelets 263 150 - 400 K/uL   nRBC 0.0 0.0 - 0.2 %    Comment: Performed at [facility], [address] [place], [place] [address]  Troponin T, High Sensitivity     Status: Abnormal   Collection Time: 02/21/24  5:10 PM  Result Value Ref Range   Troponin T High Sensitivity 516 (HH) 0 - 19 ng/L    Comment: Critical Value, Read Back and verified with Alan Flora RN,02/21/2024 @ 1748 by ADELBERT (NOTE) Biotin concentrations > 1000 ng/mL falsely decrease TnT results.  Serial cardiac troponin measurements are suggested.  Refer to the Links section for chest pain algorithms and additional  guidance. Performed at [facility], [address], [place], [place] [address]   Troponin T,  High Sensitivity     Status: Abnormal   Collection Time: 02/21/24  6:41 PM  Result Value Ref Range   Troponin T High Sensitivity 443 (HH) 0 - 19 ng/L    Comment: Critical value noted. Value is consistent with previously reported and called value  (NOTE) Biotin concentrations > 1000 ng/mL falsely decrease TnT results.  Serial cardiac troponin measurements are suggested.  Refer to the Links section for chest pain algorithms and additional  guidance. Performed at [facility], [address], [place], [place] [address]   Surgical pcr screen     Status: None   Collection Time: 02/21/24 11:03 PM   Specimen: Nasal Mucosa; Nasal Swab  Result Value Ref Range   MRSA, PCR NEGATIVE NEGATIVE   Staphylococcus  aureus NEGATIVE NEGATIVE    Comment: (NOTE) The Xpert SA Assay (FDA approved for NASAL specimens in patients 34 years of age and older), is one component of a comprehensive surveillance program. It is not intended to diagnose infection nor to guide or monitor treatment. Performed at [facility], [address], [place], [place] [address]   Heparin  level (unfractionated)     Status: None   Collection Time: 02/21/24 11:24 PM  Result Value Ref Range   Heparin  Unfractionated 0.34 0.30 - 0.70 IU/mL    Comment: (NOTE) The clinical reportable range upper limit is being lowered to >1.10 to align with the FDA approved guidance for the current laboratory assay.  If heparin  results are below expected values, and patient dosage has  been confirmed, suggest follow up testing of antithrombin III levels. Performed at [facility], [address] [place], [place] [address]   Troponin I (High Sensitivity)     Status: Abnormal   Collection Time: 02/22/24 12:42 AM  Result Value Ref Range   Troponin I (High Sensitivity) 1,121 (HH) <18 ng/L    Comment: CRITICAL RESULT CALLED TO, READ BACK BY AND VERIFIED WITH RONAL FELL RN [phone] [id number] M. ALAMANO (NOTE) Elevated  high sensitivity troponin I (hsTnI) values and significant  changes across serial measurements may suggest ACS but many other  chronic and acute conditions are known to elevate hsTnI results.  Refer to the Links section for chest pain algorithms and additional  guidance. Performed at [facility], [address] [place], [place] [address]   Comprehensive metabolic panel     Status: Abnormal   Collection Time: 02/22/24  2:13 AM  Result Value Ref Range   Sodium 141 135 - 145 mmol/L   Potassium 3.4 (L) 3.5 - 5.1 mmol/L   Chloride 107 98 - 111 mmol/L   CO2 22 22 - 32 mmol/L   Glucose, Bld 113 (H) 70 - 99 mg/dL    Comment: Glucose reference range applies only to samples taken after fasting for at least 8 hours.   BUN 12 8 - 23 mg/dL   Creatinine, Ser 9.23 0.44 - 1.00 mg/dL   Calcium  8.9 8.9 - 10.3 mg/dL   Total Protein 6.2 (L) 6.5 - 8.1 g/dL   Albumin 3.4 (L) 3.5 - 5.0 g/dL   AST 24 15 - 41 U/L   ALT 23 0 - 44 U/L   Alkaline Phosphatase 56 38 - 126 U/L   Total Bilirubin 1.1 0.0 - 1.2 mg/dL   GFR, Estimated >39 >39 mL/min    Comment: (NOTE) Calculated using the CKD-EPI Creatinine Equation (2021)    Anion gap 12 5 - 15    Comment: Performed at [facility], [address], [place], [place] [address]  Heparin  level (unfractionated)     Status: Abnormal   Collection Time: 02/22/24  2:13 AM  Result Value Ref Range   Heparin  Unfractionated 0.25 (L) 0.30 - 0.70 IU/mL    Comment: (NOTE) The clinical reportable range upper limit is being lowered to >1.10 to align with the FDA approved guidance for the current laboratory assay.  If heparin  results are below expected values, and patient dosage has  been confirmed, suggest follow up testing of antithrombin III levels. Performed at [facility], [address] [place], [place] [address]   CBC     Status: None   Collection Time: 02/22/24  2:13 AM  Result Value Ref Range  WBC 7.3 4.0 - 10.5 K/uL   RBC 4.59 3.87 -  5.11 MIL/uL   Hemoglobin 13.0 12.0 - 15.0 g/dL   HCT 60.4 63.9 - 53.9 %   MCV 86.1 80.0 - 100.0 fL   MCH 28.3 26.0 - 34.0 pg   MCHC 32.9 30.0 - 36.0 g/dL   RDW 85.3 88.4 - 84.4 %   Platelets 235 150 - 400 K/uL   nRBC 0.0 0.0 - 0.2 %    Comment: Performed at [facility], [address], [place], [place] [address]  Troponin I (High Sensitivity)     Status: Abnormal   Collection Time: 02/22/24  2:13 AM  Result Value Ref Range   Troponin I (High Sensitivity) 836 (HH) <18 ng/L    Comment: CRITICAL VALUE NOTED. VALUE IS CONSISTENT WITH PREVIOUSLY REPORTED/CALLED VALUE (NOTE) Elevated high sensitivity troponin I (hsTnI) values and significant  changes across serial measurements may suggest ACS but many other  chronic and acute conditions are known to elevate hsTnI results.  Refer to the Links section for chest pain algorithms and additional  guidance. Performed at [facility], [address] [place], [place] [address]   HIV Antibody (routine testing w rflx)     Status: None   Collection Time: 02/22/24  8:25 AM  Result Value Ref Range   HIV Screen 4th Generation wRfx Non Reactive Non Reactive    Comment: Performed at [facility], [address] [place], [place] [address]  Lipid panel     Status: Abnormal   Collection Time: 02/22/24  8:25 AM  Result Value Ref Range   Cholesterol 198 0 - 200 mg/dL   Triglycerides 76 <849 mg/dL   HDL 62 >59 mg/dL   Total CHOL/HDL Ratio 3.2 RATIO   VLDL 15 0 - 40 mg/dL   LDL Cholesterol 878 (H) 0 - 99 mg/dL    Comment:        Total Cholesterol/HDL:CHD Risk Coronary Heart Disease Risk Table                     Men   Women  1/2 Average Risk   3.4   3.3  Average Risk       5.0   4.4  2 X Average Risk   9.6   7.1  3 X Average Risk  23.4   11.0        Use the calculated Patient Ratio above and the CHD Risk Table to determine the patient's CHD Risk.        ATP III CLASSIFICATION (LDL):  <100     mg/dL   Optimal  899-870   mg/dL   Near or Above                    Optimal  130-159  mg/dL   Borderline  839-810  mg/dL   High  >809     mg/dL   Very High Performed at [facility], [address] [place], [place] [address]   ECHOCARDIOGRAM COMPLETE     Status: None   Collection Time: 02/22/24 10:56 AM  Result Value Ref Range   Weight 3,545 oz   Height 63 in   BP 100/68 mmHg   Single Plane A2C EF 32.6 %   Single Plane A4C EF 34.6 %   Calc EF 35.9 %   S' Lateral 3.80 cm   Area-P 1/2 3.99 cm2   Est EF  20 - 25%   Heparin  level (unfractionated)     Status: Abnormal   Collection Time: 02/23/24  3:44 AM  Result Value Ref Range   Heparin  Unfractionated <0.10 (L) 0.30 - 0.70 IU/mL    Comment: (NOTE) The clinical reportable range upper limit is being lowered to >1.10 to align with the FDA approved guidance for the current laboratory assay.  If heparin  results are below expected values, and patient dosage has  been confirmed, suggest follow up testing of antithrombin III levels. Performed at [facility], [address] [place], [place] [address]   CBC     Status: None   Collection Time: 02/23/24  3:44 AM  Result Value Ref Range   WBC 6.1 4.0 - 10.5 K/uL   RBC 4.67 3.87 - 5.11 MIL/uL   Hemoglobin 13.4 12.0 - 15.0 g/dL   HCT 59.3 63.9 - 53.9 %   MCV 86.9 80.0 - 100.0 fL   MCH 28.7 26.0 - 34.0 pg   MCHC 33.0 30.0 - 36.0 g/dL   RDW 84.9 88.4 - 84.4 %   Platelets 217 150 - 400 K/uL   nRBC 0.0 0.0 - 0.2 %    Comment: Performed at [facility], [address], [place], [place] [address]  Basic metabolic panel with GFR     Status: Abnormal   Collection Time: 02/23/24  3:44 AM  Result Value Ref Range   Sodium 139 135 - 145 mmol/L   Potassium 3.9 3.5 - 5.1 mmol/L   Chloride 111 98 - 111 mmol/L   CO2 23 22 - 32 mmol/L   Glucose, Bld 107 (H) 70 - 99 mg/dL    Comment: Glucose reference range applies only to samples taken after fasting for at least 8 hours.   BUN 10 8 - 23 mg/dL    Creatinine, Ser 9.12 0.44 - 1.00 mg/dL   Calcium  9.0 8.9 - 10.3 mg/dL   GFR, Estimated >39 >39 mL/min    Comment: (NOTE) Calculated using the CKD-EPI Creatinine Equation (2021)    Anion gap 5 5 - 15    Comment: Performed at [facility], [address] [place], [place] [address]  Magnesium     Status: None   Collection Time: 02/23/24  3:44 AM  Result Value Ref Range   Magnesium 1.8 1.7 - 2.4 mg/dL    Comment: Performed at [facility], [address], [place], [place] [address]  Lipoprotein A (LPA)     Status: None   Collection Time: 02/23/24  3:44 AM  Result Value Ref Range   Lipoprotein (a) 20.4 <75.0 nmol/L    Comment: (NOTE) This test was developed and its performance characteristics determined by [facility]. It has not been cleared or approved by the Food and Drug Administration. Note:  Values greater than or equal to 75.0 nmol/L may       indicate an independent risk factor for CHD,       but must be evaluated with caution when applied       to non-Caucasian populations due to the       influence of genetic factors on Lp(a) across       ethnicities. Performed At: [facility] [address] [place], [place] [id number] Jennette Shorter MD Ey:[id number]   Differential     Status: None   Collection Time: 02/23/24  3:44 AM  Result Value Ref Range   Neutrophils Relative % 52 %   Neutro Abs  3.2 1.7 - 7.7 K/uL   Lymphocytes Relative 36 %   Lymphs Abs 2.2 0.7 - 4.0 K/uL   Monocytes Relative 9 %   Monocytes Absolute 0.5 0.1 - 1.0 K/uL   Eosinophils Relative 2 %   Eosinophils Absolute 0.1 0.0 - 0.5 K/uL   Basophils Relative 1 %   Basophils Absolute 0.1 0.0 - 0.1 K/uL   Immature Granulocytes 0 %   Abs Immature Granulocytes 0.01 0.00 - 0.07 K/uL    Comment: Performed at [facility], [address], [place], [place] [address]  Troponin I (High Sensitivity)     Status: Abnormal   Collection Time: 02/23/24  7:17 AM  Result Value Ref Range   Troponin I  (High Sensitivity) 158 (HH) <18 ng/L    Comment: CRITICAL VALUE NOTED. VALUE IS CONSISTENT WITH PREVIOUSLY REPORTED/CALLED VALUE (NOTE) Elevated high sensitivity troponin I (hsTnI) values and significant  changes across serial measurements may suggest ACS but many other  chronic and acute conditions are known to elevate hsTnI results.  Refer to the Links section for chest pain algorithms and additional  guidance. Performed at [facility], [address], [place], [place] [address]   Basic Metabolic Panel (BMET)     Status: None   Collection Time: 03/01/24 10:18 AM  Result Value Ref Range   Sodium 139 135 - 145 mmol/L   Potassium 4.2 3.5 - 5.1 mmol/L   Chloride 104 98 - 111 mmol/L   CO2 24 22 - 32 mmol/L   Glucose, Bld 94 70 - 99 mg/dL    Comment: Glucose reference range applies only to samples taken after fasting for at least 8 hours.   BUN 16 8 - 23 mg/dL   Creatinine, Ser 9.08 0.44 - 1.00 mg/dL   Calcium  9.4 8.9 - 10.3 mg/dL   GFR, Estimated >39 >39 mL/min    Comment: (NOTE) Calculated using the CKD-EPI Creatinine Equation (2021)    Anion gap 11 5 - 15    Comment: Performed at [facility], [address], [place], [place] [address]  B Nat Peptide     Status: Abnormal   Collection Time: 03/01/24 10:18 AM  Result Value Ref Range   B Natriuretic Peptide 311.2 (H) 0.0 - 100.0 pg/mL    Comment: Performed at [facility], [address], [place], [place] [address]  Basic Metabolic Panel (BMET)     Status: None   Collection Time: 03/08/24 11:02 AM  Result Value Ref Range   Sodium 139 135 - 145 mmol/L   Potassium 4.2 3.5 - 5.1 mmol/L   Chloride 108 98 - 111 mmol/L   CO2 23 22 - 32 mmol/L   Glucose, Bld 86 70 - 99 mg/dL    Comment: Glucose reference range applies only to samples taken after fasting for at least 8 hours.   BUN 17 8 - 23 mg/dL   Creatinine, Ser 9.10 0.44 - 1.00 mg/dL   Calcium  9.4 8.9 - 10.3 mg/dL   GFR, Estimated >39 >39 mL/min    Comment:  (NOTE) Calculated using the CKD-EPI Creatinine Equation (2021)    Anion gap 8 5 - 15    Comment: Performed at [facility], [address] [place], [place] [address]  Basic metabolic panel with GFR     Status: None   Collection Time: 04/08/24  8:31 AM  Result Value Ref Range   Glucose 83 70 - 99 mg/dL   BUN  12 8 - 27 mg/dL   Creatinine, Ser 9.21 0.57 - 1.00 mg/dL   eGFR 82 >40 fO/fpw/8.26   BUN/Creatinine Ratio 15 12 - 28   Sodium 140 134 - 144 mmol/L   Potassium 3.8 3.5 - 5.2 mmol/L   Chloride 105 96 - 106 mmol/L   CO2 20 20 - 29 mmol/L   Calcium  9.4 8.7 - 10.3 mg/dL     Assessment/ Plan: Sharlet Merlynn Salmon here for annual physical exam.   Annual physical exam  Yeast vaginitis - Plan: fluconazole  (DIFLUCAN ) 150 MG tablet  Takotsubo cardiomyopathy - Plan: Lipid Panel, CMP14+EGFR  Mixed hyperlipidemia - Plan: Lipid Panel, CMP14+EGFR  Primary hypertension - Plan: Lipid Panel, CMP14+EGFR  Morbid obesity (HCC) - Plan: Lipid Panel, CMP14+EGFR, Bayer DCA Hb A1c Waived   Diflucan  sent for presumed yeast vaginitis in the setting of Farxiga  use.  We discussed hygiene recommendations and efforts to reduce recurrence  Fasting labs collected.  Continue all medications as prescribed by cardiology.  We discussed consideration for Wegovy potentially given history of heart attack.  Blood pressure well-controlled.  She declined all vaccinations today  Counseled on healthy lifestyle choices, including diet (rich in fruits, vegetables and lean meats and low in salt and simple carbohydrates) and exercise (at least 30 minutes of moderate physical activity daily).  Patient to follow up 36m for mood  Matasha Smigelski M. Jolinda, DO

## 2024-04-17 ENCOUNTER — Ambulatory Visit: Payer: Self-pay | Admitting: Family Medicine

## 2024-04-17 ENCOUNTER — Other Ambulatory Visit: Payer: Self-pay | Admitting: Family Medicine

## 2024-04-17 DIAGNOSIS — I10 Essential (primary) hypertension: Secondary | ICD-10-CM

## 2024-04-17 NOTE — Telephone Encounter (Signed)
 Patient aware and verbalized understanding. Copy of results mailed per patient request.

## 2024-04-18 NOTE — Progress Notes (Signed)
 Rachael Nguyen                                          MRN: [id number]   04/18/2024   The [facility] Quality Team Specialist reviewed this patient medical record for the purposes of chart review for care gap closure. The following were reviewed: abstraction for care gap closure-controlling blood pressure.    [facility] Quality Team

## 2024-05-02 ENCOUNTER — Ambulatory Visit

## 2024-06-03 ENCOUNTER — Telehealth: Payer: Self-pay | Admitting: Family Medicine

## 2024-06-03 NOTE — Telephone Encounter (Unsigned)
 Copied from CRM #[id number]. Topic: Clinical - Medication Refill >> Jun 03, 2024  2:06 PM Cherylann S wrote: Medication: fluconazole  (DIFLUCAN ) 150 MG tablet   Has the patient contacted their pharmacy? No (Agent: If no, request that the patient contact the pharmacy for the refill. If patient does not wish to contact the pharmacy document the reason why and proceed with request.) (Agent: If yes, when and what did the pharmacy advise?)  This is the patient's preferred pharmacy:  [id number] - [place], [place] - [address] [place] [address] Phone: [phone] Fax: [phone]  Is this the correct pharmacy for this prescription? Yes If no, delete pharmacy and type the correct one.   Has the prescription been filled recently? No  Is the patient out of the medication? Yes  Has the patient been seen for an appointment in the last year OR does the patient have an upcoming appointment? Yes  Can we respond through MyChart? No  Agent: Please be advised that Rx refills may take up to 3 business days. We ask that you follow-up with your pharmacy.

## 2024-06-04 ENCOUNTER — Ambulatory Visit: Payer: Self-pay

## 2024-06-04 ENCOUNTER — Ambulatory Visit (HOSPITAL_COMMUNITY)
Admission: RE | Admit: 2024-06-04 | Discharge: 2024-06-04 | Disposition: A | Source: Ambulatory Visit | Attending: Cardiology

## 2024-06-04 DIAGNOSIS — I5181 Takotsubo syndrome: Secondary | ICD-10-CM | POA: Insufficient documentation

## 2024-06-04 DIAGNOSIS — I10 Essential (primary) hypertension: Secondary | ICD-10-CM

## 2024-06-04 DIAGNOSIS — I447 Left bundle-branch block, unspecified: Secondary | ICD-10-CM | POA: Diagnosis not present

## 2024-06-04 LAB — ECHOCARDIOGRAM COMPLETE
Area-P 1/2: 4.58 cm2
S' Lateral: 2.9 cm

## 2024-06-04 NOTE — Telephone Encounter (Signed)
 FYI Only or Action Required?: FYI only for provider: appointment scheduled on 06/05/2024.  Patient was last seen in primary care on 04/16/2024 by Jolinda Norene HERO, DO.  Called Nurse Triage reporting Vaginitis.  Symptoms began a week ago.  Interventions attempted: Nothing.  Symptoms are: unchanged.  Triage Disposition: See PCP Within 2 Weeks  Patient/caregiver understands and will follow disposition?: Yes  Reason for Disposition  All other vaginal symptoms  (Exceptions: Feels like prior yeast infection, minor abrasion, mild rash < 24 hour duration, mild itching, vaginal dryness during sex.)  Answer Assessment - Initial Assessment Questions States she took one pill of Diflucan  a month ago for yeast infection. Went away and came back weeks later and took 1 pill again and cleared it up. Now having symptoms again.  Patient upset she couldn't just get refilled without coming in.  1. SYMPTOM: What's the main symptom you're concerned about? (e.g., pain, itching, dryness)     Itching and burning 2. LOCATION: Where is the  itching located? (e.g., inside/outside, left/right)     Vaginal area 3. ONSET: When did the  itching and burning  start?     1 week ago 4. PAIN: Is there any pain? If Yes, ask: How bad is it? (Scale: 1-10; mild, moderate, severe)     Denies 7. OTHER SYMPTOMS: Do you have any other symptoms? (e.g., fever, itching, vaginal bleeding, pain with urination, injury to genital area, vaginal foreign body) Itching, burning  Protocols used: Vaginal Symptoms-A-AH

## 2024-06-04 NOTE — Telephone Encounter (Signed)
 noted

## 2024-06-04 NOTE — Telephone Encounter (Signed)
 This RN made first attempt to contact pt with no answer. A voicemail was left with call back number provided.       Copied from CRM #[id number]. Topic: Clinical - Medication Refill >> Jun 03, 2024  2:06 PM Cherylann S wrote: Medication: fluconazole  (DIFLUCAN ) 150 MG tablet    Has the patient contacted their pharmacy? No (Agent: If no, request that the patient contact the pharmacy for the refill. If patient does not wish to contact the pharmacy document the reason why and proceed with request.) (Agent: If yes, when and what did the pharmacy advise?)   This is the patient's preferred pharmacy:  [id number] - [place], [place] - [address] [place] [address] Phone: [phone] Fax: [phone]   Is this the correct pharmacy for this prescription? Yes If no, delete pharmacy and type the correct one.    Has the prescription been filled recently? No   Is the patient out of the medication? Yes   Has the patient been seen for an appointment in the last year OR does the patient have an upcoming appointment? Yes   Can we respond through MyChart? No   Agent: Please be advised that Rx refills may take up to 3 business days. We ask that you follow-up with your pharmacy.

## 2024-06-04 NOTE — Telephone Encounter (Signed)
 FYI Only or Action Required?: Action required by provider: clinical question for provider and update on patient condition.  Patient was last seen in primary care on 04/16/2024 by Jolinda Norene HERO, DO.    Called pt to discuss her calling requesting diflucan : pt was upset stating she called on Monday regarding this and then spoke to a nurse today who insisted she schedule an appointment to come in and pt does not want to come in. Pt states PCP is already aware that since starting FARXIGA  she has started having yeast infections- PCP wrote prescription for 2 tablets and it did clear the yeast infection up but now yeast is back. Pt is requesting a refill on diflucan  to help with yeast.  If additional information is needed, please call pt directly to update on plan of care moving forward per patient. Patient requested to cancel appt and wait on PCP response on refill 1st and pt will go from there.

## 2024-06-05 ENCOUNTER — Other Ambulatory Visit: Payer: Self-pay | Admitting: Family Medicine

## 2024-06-05 ENCOUNTER — Ambulatory Visit

## 2024-06-05 DIAGNOSIS — B3731 Acute candidiasis of vulva and vagina: Secondary | ICD-10-CM

## 2024-06-05 MED ORDER — FLUCONAZOLE 150 MG PO TABS: 150.0000 mg | ORAL_TABLET | Freq: Once | ORAL | 0 refills | Status: AC

## 2024-06-05 NOTE — Telephone Encounter (Signed)
 S/W patient about echo results from Katlyn West, NP and patient voiced understanding.

## 2024-06-05 NOTE — Telephone Encounter (Signed)
 Please let the patient know that I sent their prescription to their pharmacy. Thanks, WS

## 2024-06-05 NOTE — Telephone Encounter (Signed)
 Pt informed    LS

## 2024-06-13 NOTE — Progress Notes (Unsigned)
 Cardiology Office Note    Date:  06/17/2024  ID:  Rachael Nguyen 07/03/1951, MRN [id number] PCP:  Rachael Norene HERO, DO  Cardiologist:  Rachael Lesches, MD  Electrophysiologist:  None   Chief Complaint: Follow up for HFrEF   History of Present Illness: .   Rachael Nguyen is a 73 y.o. female with visit-pertinent history of  NSTEMI and heart failure with reduced ejection fraction (recovered), hypertension and hyperlipidemia.   Patient was admitted on 02/26/2024 with NSTEMI.  LHC showed no coronary artery disease, ventriculography consistent with Takotsubo cardiomyopathy.  Echocardiogram indicated EF 20 to 25%, suspected Takotsubo CM with akinesis of distal 2/3 of the LV, G1 DD, mild concentric LVH, RV was normal, trivial MR.  Patient was discharged on   Patient was seen in advanced heart failure clinic for [facility] visit on 03/01/2024.  Reported feeling overall better, denied any palpitations, chest pain, dizziness, edema, PND or orthopnea.  She did report some increased shortness of breath with activity.  Patient reported that she had discontinued losartan  over low blood pressures, noted that she had called 911 over the weekend as her blood pressure was in the 80s and she did not feel well, also noted taking 2 doses of nitroglycerin  for transient chest tingling, likely resulting in the decrease in her blood pressure.  Reports that by the time EMS arrived her blood pressure was back up in the 100s.  Patient was restarted on losartan  12.5 mg at bedtime.  Patient was last seen in clinic on 03/25/2024 for follow-up.  Patient reported she had discontinued use of losartan  in setting of hypotension increase fatigue.  Patient reported that when she was taking all medications her systolic blood pressure would drop into the 80s resulting in significant fatigue and lightheadedness.  She reported that this resolved with discontinuation of losartan .  She denied any chest pain, pressure or tightness, noted  occasional spasm sensation or tingling in her chest that resolved within a few seconds.  Patient spironolactone  was discontinued and she was started on losartan  12.5 mg daily.  Echocardiogram on 06/04/2024 indicated LVEF 60 to 65%, no RWMA, diastolic parameters were normal, RV systolic function and size was normal, normal PASP, trivial mitral valve regurgitation, no evidence of stenosis.  Aortic valve Rachael Nguyen not visualized, no stenosis present.  There is mild dilation of the ascending aorta measuring 39 mm.  Today she presents for follow-up.  She reports that she has been doing well overall.  Patient reports that she has lost 30 pounds since her hospitalization, she has been regularly exercising twice a week at an exercise class, tolerates well.  She denies any chest pain, shortness of breath on exertion.  She does note some occasional left upper abdomen, under the breast discomfort last week not associate with exertion that spontaneously resolved.  She denies any palpitations, lower extremity edema, orthopnea or PND.  She reports that her blood pressure is typically very well-controlled at home with systolic less than 120.  She does note some overall fatigue that has not significantly changed, patient does note a history of sleep apnea.  Patient reports that since starting on Farxiga  she has had at least 3 yeast infections requiring treatment. ROS: .   Today she denies shortness of breath, lower extremity edema, fatigue, palpitations, melena, hematuria, hemoptysis, diaphoresis, weakness, presyncope, syncope, orthopnea, and PND.  All other systems are reviewed and otherwise negative. Studies Reviewed: Rachael Nguyen   EKG:  EKG is not ordered today.  CV Studies:  Cardiac studies reviewed are outlined and summarized above. Otherwise please see EMR for full report. Cardiac Studies & Procedures   ______________________________________________________________________________________________ CARDIAC  CATHETERIZATION  CARDIAC CATHETERIZATION 02/22/2024  Conclusion 1.  Normal, right dominant coronary circulation without evidence of spasm, dissection, or bridging. 2.  Ventriculography consistent with Takotsubo cardiomyopathy with LVEDP of 16 mmHg.  Recommendation: Goal-directed medical therapy.  Findings Coronary Findings Diagnostic  Dominance: Right  No diagnostic findings have been documented. Intervention  No interventions have been documented.   STRESS TESTS  MYOCARDIAL PERFUSION IMAGING 12/01/2020  Interpretation Summary  Nuclear stress EF: 74%. The left ventricular ejection fraction is hyperdynamic (>65%).  This is a low risk study. There is no evidence of ischemia and no evidence of previous infarction.  The study is normal.   ECHOCARDIOGRAM  ECHOCARDIOGRAM COMPLETE 06/04/2024  Narrative ECHOCARDIOGRAM REPORT    Patient Name:   Rachael Nguyen Date of Exam: 06/04/2024 Medical Rec #:  [id number]         Height:       63.0 in Accession #:    [id number]        Weight:       206.2 lb Date of Birth:  [birth year]        BSA:          1.959 m Patient Age:    73 years          BP:           114/65 mmHg Patient Gender: F                 HR:           60 bpm. Exam Location:  [address]  Procedure: 2D Echo, Cardiac Doppler, Color Doppler, 3D Echo and Strain Analysis (Both Spectral and Color Flow Doppler were utilized during procedure).  Indications:    Cardiomyopathy-unspecified I42.9  History:        Patient has prior history of Echocardiogram examinations, most recent 02/22/2024. Takosubo Cardiomyopathy; Risk Factors:Dyslipidemia.  Sonographer:    Augustin Seals RDCS Referring Phys: [id number] Cort Dragoo D Sativa Gelles  IMPRESSIONS   1. Compared to 02/22/24 LV function has improved. 2. Left ventricular ejection fraction, by estimation, is 60 to 65%. The left ventricle has normal function. The left ventricle has no regional wall motion abnormalities. Left ventricular  diastolic parameters were normal. The average left ventricular global longitudinal strain is -19.2 %. The global longitudinal strain is normal. 3. Right ventricular systolic function is normal. The right ventricular size is normal. There is normal pulmonary artery systolic pressure. 4. The mitral valve is normal in structure. Trivial mitral valve regurgitation. No evidence of mitral stenosis. 5. The aortic valve is tricuspid. Aortic valve regurgitation is not visualized. No aortic stenosis is present. 6. Aortic dilatation noted. There is mild dilatation of the ascending aorta, measuring 39 mm. 7. The inferior vena cava is normal in size with greater than 50% respiratory variability, suggesting right atrial pressure of 3 mmHg.  FINDINGS Left Ventricle: Left ventricular ejection fraction, by estimation, is 60 to 65%. The left ventricle has normal function. The left ventricle has no regional wall motion abnormalities. The average left ventricular global longitudinal strain is -19.2 %. Strain was performed and the global longitudinal strain is normal. The left ventricular internal cavity size was normal in size. There is no left ventricular hypertrophy. Left ventricular diastolic parameters were normal.  Right Ventricle: The right ventricular size is normal. Right ventricular systolic function is normal. There is normal  pulmonary artery systolic pressure. The tricuspid regurgitant velocity is 1.74 m/s, and with an assumed right atrial pressure of 3 mmHg, the estimated right ventricular systolic pressure is 15.1 mmHg.  Left Atrium: Left atrial size was normal in size.  Right Atrium: Right atrial size was normal in size.  Pericardium: There is no evidence of pericardial effusion.  Mitral Valve: The mitral valve is normal in structure. Trivial mitral valve regurgitation. No evidence of mitral valve stenosis.  Tricuspid Valve: The tricuspid valve is normal in structure. Tricuspid valve regurgitation  is trivial. No evidence of tricuspid stenosis.  Aortic Valve: The aortic valve is tricuspid. Aortic valve regurgitation is not visualized. No aortic stenosis is present.  Pulmonic Valve: The pulmonic valve was normal in structure. Pulmonic valve regurgitation is not visualized. No evidence of pulmonic stenosis.  Aorta: Aortic dilatation noted. There is mild dilatation of the ascending aorta, measuring 39 mm.  Venous: The inferior vena cava is normal in size with greater than 50% respiratory variability, suggesting right atrial pressure of 3 mmHg.  IAS/Shunts: No atrial level shunt detected by color flow Doppler.  Additional Comments: Compared to 02/22/24 LV function has improved. 3D was performed not requiring image post processing on an independent workstation and was normal.   LEFT VENTRICLE PLAX 2D LVIDd:         5.00 cm   Diastology LVIDs:         2.90 cm   LV e' medial:    6.22 cm/s LV PW:         1.00 cm   LV E/e' medial:  11.3 LV IVS:        1.00 cm   LV e' lateral:   8.76 cm/s LVOT diam:     2.10 cm   LV E/e' lateral: 8.0 LV SV:         81 LV SV Index:   41        2D Longitudinal Strain LVOT Area:     3.46 cm  2D Strain GLS (A4C):   -19.4 % 2D Strain GLS (A3C):   -18.9 % 2D Strain GLS (A2C):   -19.4 % 2D Strain GLS Avg:     -19.2 %  3D Volume EF: 3D EF:        66 % LV EDV:       142 ml LV ESV:       48 ml LV SV:        94 ml  RIGHT VENTRICLE            IVC RV Basal diam:  3.70 cm    IVC diam: 1.70 cm RV Mid diam:    2.80 cm RV S prime:     8.32 cm/s  PULMONARY VEINS TAPSE (M-mode): 1.4 cm     A Reversal Velocity: 37.40 cm/s  LEFT ATRIUM             Index        RIGHT ATRIUM           Index LA diam:        4.40 cm 2.25 cm/m   RA Area:     15.30 cm LA Vol (A2C):   58.7 ml 29.97 ml/m  RA Volume:   38.80 ml  19.81 ml/m LA Vol (A4C):   31.9 ml 16.28 ml/m LA Biplane Vol: 43.2 ml 22.05 ml/m AORTIC VALVE LVOT Vmax:   96.90 cm/s LVOT Vmean:  66.000 cm/s LVOT VTI:     0.234  m  AORTA Ao Root diam: 2.90 cm Ao Asc diam:  4.00 cm  MITRAL VALVE               TRICUSPID VALVE MV Area (PHT): 4.58 cm    TR Peak grad:   12.1 mmHg MV Decel Time: 166 msec    TR Vmax:        174.00 cm/s MV E velocity: 70.50 cm/s MV A velocity: 54.50 cm/s  SHUNTS MV E/A ratio:  1.29        Systemic VTI:  0.23 m Systemic Diam: 2.10 cm  Redell Shallow MD Electronically signed by Redell Shallow MD Signature Date/Time: 06/04/2024/10:35:59 AM    Final      CT SCANS  CT CARDIAC SCORING (SELF PAY ONLY) 12/05/2023  Addendum 12/26/2023  9:39 PM ADDENDUM REPORT: 12/26/2023 21:36  EXAM: OVER-READ INTERPRETATION  CT CHEST  The following report is an over-read performed by radiologist Dr. Suzen Dials of [facility] on 12/26/2023. This over-read does not include interpretation of cardiac or coronary anatomy or pathology. The coronary calcium  score/coronary CTA interpretation by the cardiologist is attached.  COMPARISON:  None.  FINDINGS: Cardiovascular: There are no significant extracardiac vascular findings.  Mediastinum/Nodes: There are no enlarged lymph nodes within the visualized mediastinum.  Lungs/Pleura: There is no pleural effusion. The visualized lungs appear clear.  Upper abdomen: There is a small hiatal hernia.  Musculoskeletal/Chest wall: A chronic compression deformity is seen involving a vertebral body within the midthoracic spine.  IMPRESSION: 1. Small hiatal hernia. 2. Chronic compression deformity involving a vertebral body within the midthoracic spine.   Electronically Signed By: Suzen Dials M.D. On: 12/26/2023 21:36  Narrative CLINICAL DATA:  Cardiovascular Disease Risk stratification  EXAM: Coronary Calcium  Score  TECHNIQUE: A gated, non-contrast computed tomography scan of the heart was performed using 3 mm slice thickness. Axial images were analyzed on a dedicated workstation. Calcium  scoring of the  coronary arteries was performed using the Agatston method.  FINDINGS: Coronary Calcium  Score:  Total Score: 0  Pericardium: Normal.  Ascending Aorta: Normal caliber.  IMPRESSION: 1. Coronary calcium  score of 0.  2. Non-cardiac: See separate report from [facility].  RECOMMENDATIONS: Coronary artery calcium  (CAC) score is a strong predictor of incident coronary heart disease (CHD) and provides predictive information beyond traditional risk factors. CAC scoring is reasonable to use in the decision to withhold, postpone, or initiate statin therapy in intermediate-risk or selected borderline-risk asymptomatic adults (age 64-75 years and LDL-C >=70 to <190 mg/dL) who do not have diabetes or established atherosclerotic cardiovascular disease (ASCVD).* In intermediate-risk (10-year ASCVD risk >=7.5% to <20%) adults or selected borderline-risk (10-year ASCVD risk >=5% to <7.5%) adults in whom a CAC score is measured for the purpose of making a treatment decision the following recommendations have been made:  If CAC=0, it is reasonable to withhold statin therapy and reassess in 5 to 10 years, as long as higher risk conditions are absent (diabetes mellitus, family history of premature CHD in first degree relatives (males <55 years; females <65 years), cigarette smoking, or LDL >=190 mg/dL).  If CAC is 1 to 99, it is reasonable to initiate statin therapy for patients >=32 years of age.  If CAC is >=100 or >=75th percentile, it is reasonable to initiate statin therapy at any age.  Cardiology referral should be considered for patients with CAC scores >=400 or >=75th percentile.  *2018 AHA/ACC/AACVPR/AAPA/ABC/ACPM/ADA/AGS/APhA/ASPC/NLA/PCNA Guideline on the Management of Blood Cholesterol: A Report of the [employer] of  Cardiology/American Heart Association Task Force on Clinical Practice Guidelines. J Am Coll Cardiol. 2019;73(24):3168-3209.  Madonna Large, DO,  [facility]  Electronically Signed: By: Madonna Large On: 12/07/2023 22:53     ______________________________________________________________________________________________       Current Reported Medications:.    Active Medications[1]  Physical Exam:    VS:  BP 138/82   Pulse 60   Ht 5' 3 (1.6 m)   Wt 196 lb 3.2 oz (89 kg)   SpO2 99%   BMI 34.76 kg/m    Wt Readings from Last 3 Encounters:  06/17/24 196 lb 3.2 oz (89 kg)  04/16/24 206 lb 3.2 oz (93.5 kg)  03/26/24 210 lb 8 oz (95.5 kg)    GEN: Well nourished, well developed in no acute distress NECK: No JVD; No carotid bruits CARDIAC: RRR, no murmurs, rubs, gallops RESPIRATORY:  Clear to auscultation without rales, wheezing or rhonchi  ABDOMEN: Soft, non-tender, non-distended EXTREMITIES:  No edema; No acute deformity     Asessement and Plan:.    HFrEF/Takotsubo CM:  Echo in 02/2024 with a EF 20 to 25%, suspected Takotsubo CM with akinesis of the distal 2/3 of the LV, G1 DD, mild concentric LVH, RV was normal with trivial MR.  LHC showed no CAD.  Ventriculography consistent with Takotsubo CM. Last echo on 06/04/2024 indicated LVEF 60 to 65%, no RWMA. Today she reports that she is doing well, denies any chest pain or shortness of breath with exertion such as when she is attending her exercise classes.  She does note some occasional random chest discomforts that spontaneously resolve, overall atypical.  She will continue monitoring.  She denies any increased lower extremity edema, orthopnea or PND. Patient appears euvolemic and well compensated on exam.  Patient reports recurrent yeast infections requiring treatments and starting on Farxiga , will discontinue today.  Patient reports she has not required Lasix , will change to as needed. Continue losartan  12.5 mg daily, Toprol  XL 25 mg daily.  Hypertension: Blood pressure today initially 158/100, on recheck was 138/82.  Patient reports she took her medications prior to appointment and blood  pressure at home is consistently less than 120 systolic.  Encouraged patient to continue monitoring at home, to notify office if consistently elevated above 130/80.  Continue losartan  12.5 mg daily and metoprolol  succinate 25 mg nightly.  LBBB: Stable and chronic.  Patient denies any palpitations, dizziness, lightheadedness, presyncope or syncope.  OSA/fatigue: Patient with history of mild sleep apnea with previous recommendation for CPAP, patient has declined CPAP therapy.  She does note some ongoing fatigue, discussed repeat sleep study however patient reports she is not interested in CPAP therapy.  She will continue to monitor and work towards lifestyle modifications.   Disposition: F/u with Dr. Court in 4-5 months or sooner if needed.   Signed, James Senn D Sally-Ann Cutbirth, NP       [1]  Current Meds  Medication Sig   acetaminophen (TYLENOL) 650 MG CR tablet Take 1,300 mg by mouth every 8 (eight) hours as needed for pain.   aspirin  EC 81 MG tablet Take 1 tablet (81 mg total) by mouth daily. Swallow whole.   busPIRone  (BUSPAR ) 7.5 MG tablet Take 0.5-1 tablets (3.75-7.5 mg total) by mouth 2 (two) times daily. For anxiety   cholecalciferol (VITAMIN D3) 25 MCG (1000 UNIT) tablet Take 2,000 Units by mouth daily.   famotidine (PEPCID) 20 MG tablet Take 20 mg by mouth daily.   losartan  (COZAAR ) 25 MG tablet Take 0.5 tablets (12.5 mg total)  by mouth at bedtime.   metoprolol  succinate (TOPROL -XL) 25 MG 24 hr tablet Take 1 tablet (25 mg total) by mouth at bedtime.   nitroGLYCERIN  (NITROSTAT ) 0.4 MG SL tablet Place 1 tablet (0.4 mg total) under the tongue every 5 (five) minutes as needed for chest pain.   nystatin  powder Apply 1 Application topically 3 (three) times daily. X7-10 days per flareup of rash under breasts   pravastatin  (PRAVACHOL ) 40 MG tablet Take 40 mg by mouth daily.   pravastatin  (PRAVACHOL ) 80 MG tablet Take 0.5 tablets (40 mg total) by mouth daily.   triamcinolone  cream (KENALOG ) 0.1 % Apply 1  Application topically 2 (two) times daily. X7 days as needed for itchy rash   Zinc Citrate (ZINC GUMMY PO) Take 11 mg by mouth at bedtime.   [DISCONTINUED] dapagliflozin  propanediol (FARXIGA ) 10 MG TABS tablet Take 1 tablet (10 mg total) by mouth daily.   [DISCONTINUED] furosemide  (LASIX ) 20 MG tablet Take 1 tablet (20 mg total) by mouth daily.

## 2024-06-17 ENCOUNTER — Encounter: Payer: Self-pay | Admitting: Cardiology

## 2024-06-17 ENCOUNTER — Ambulatory Visit: Attending: Cardiology | Admitting: Cardiology

## 2024-06-17 VITALS — BP 138/82 | HR 60 | Ht 63.0 in | Wt 196.2 lb

## 2024-06-17 DIAGNOSIS — I447 Left bundle-branch block, unspecified: Secondary | ICD-10-CM | POA: Diagnosis not present

## 2024-06-17 DIAGNOSIS — I10 Essential (primary) hypertension: Secondary | ICD-10-CM | POA: Diagnosis not present

## 2024-06-17 DIAGNOSIS — G4733 Obstructive sleep apnea (adult) (pediatric): Secondary | ICD-10-CM

## 2024-06-17 DIAGNOSIS — I5181 Takotsubo syndrome: Secondary | ICD-10-CM | POA: Diagnosis not present

## 2024-06-17 MED ORDER — FUROSEMIDE 20 MG PO TABS: 20.0000 mg | ORAL_TABLET | ORAL | Status: AC | PRN

## 2024-06-17 NOTE — Patient Instructions (Signed)
 Thank you for choosing [facility]!     Medication Instructions:  Discontinue the Farxiga .  Continue the Lasix  as needed. *If you need a refill on your cardiac medications before your next appointment, please call your pharmacy*   Lab Work: No labs were ordered during today's visit.  If you have labs (blood work) drawn today and your tests are completely normal, you will receive your results only by: MyChart Message (if you have MyChart) OR A paper copy in the mail If you have any lab test that is abnormal or we need to change your treatment, we will call you to review the results.   Testing/Procedures:  No procedures were ordered during today's visit.  Your next appointment:   4-5 month(s)   Provider:   Dorn Lesches, MD     Follow-Up: At [facility], you and your health needs are our priority.  As part of our continuing mission to provide you with exceptional heart care, we have created designated Provider Care Teams.  These Care Teams include your primary Cardiologist (physician) and Advanced Practice Providers (APPs -  Physician Assistants and Nurse Practitioners) who all work together to provide you with the care you need, when you need it. We recommend signing up for the patient portal called MyChart.  Sign up information is provided on this After Visit Summary.  MyChart is used to connect with patients for Virtual Visits (Telemedicine).  Patients are able to view lab/test results, encounter notes, upcoming appointments, etc.  Non-urgent messages can be sent to your provider as well.   To learn more about what you can do with MyChart, go to [email or web address].

## 2024-06-19 NOTE — Progress Notes (Signed)
 Pharmacy Quality Measure Review  This patient is appearing on a report for being at risk of failing the adherence measure for diabetes medications this calendar year.   Medication: Farxiga  10 mg Last fill date: 05/29/24 for 30 day supply  Insurance report was not up to date. No action needed at this time.   Jenkins Graces, PharmD PGY1 Pharmacy Resident

## 2024-07-23 ENCOUNTER — Ambulatory Visit

## 2024-07-30 ENCOUNTER — Ambulatory Visit

## 2024-10-16 ENCOUNTER — Ambulatory Visit: Payer: Self-pay | Admitting: Family Medicine

## 2025-04-21 ENCOUNTER — Encounter: Payer: Self-pay | Admitting: Family Medicine
